# Patient Record
Sex: Female | Born: 1955 | Race: White | Hispanic: No | Smoking: Never smoker
Health system: Southern US, Community
[De-identification: ages and names within clinical notes are randomized; demographics above are authoritative.]

## PROBLEM LIST (undated history)

## (undated) DIAGNOSIS — K219 Gastro-esophageal reflux disease without esophagitis: Secondary | ICD-10-CM

## (undated) DIAGNOSIS — M961 Postlaminectomy syndrome, not elsewhere classified: Secondary | ICD-10-CM

## (undated) DIAGNOSIS — E119 Type 2 diabetes mellitus without complications: Secondary | ICD-10-CM

## (undated) DIAGNOSIS — E039 Hypothyroidism, unspecified: Secondary | ICD-10-CM

## (undated) DIAGNOSIS — G4733 Obstructive sleep apnea (adult) (pediatric): Secondary | ICD-10-CM

## (undated) DIAGNOSIS — L409 Psoriasis, unspecified: Secondary | ICD-10-CM

## (undated) DIAGNOSIS — R600 Localized edema: Secondary | ICD-10-CM

## (undated) DIAGNOSIS — N3281 Overactive bladder: Secondary | ICD-10-CM

## (undated) DIAGNOSIS — F32A Depression, unspecified: Secondary | ICD-10-CM

## (undated) DIAGNOSIS — I10 Essential (primary) hypertension: Secondary | ICD-10-CM

## (undated) DIAGNOSIS — Z974 Presence of external hearing-aid: Secondary | ICD-10-CM

## (undated) DIAGNOSIS — G4730 Sleep apnea, unspecified: Secondary | ICD-10-CM

## (undated) DIAGNOSIS — N393 Stress incontinence (female) (male): Secondary | ICD-10-CM

## (undated) DIAGNOSIS — L4050 Arthropathic psoriasis, unspecified: Secondary | ICD-10-CM

## (undated) DIAGNOSIS — M4800 Spinal stenosis, site unspecified: Secondary | ICD-10-CM

## (undated) DIAGNOSIS — E7800 Pure hypercholesterolemia, unspecified: Secondary | ICD-10-CM

## (undated) DIAGNOSIS — M109 Gout, unspecified: Secondary | ICD-10-CM

## (undated) DIAGNOSIS — Z9989 Dependence on other enabling machines and devices: Secondary | ICD-10-CM

## (undated) DIAGNOSIS — I519 Heart disease, unspecified: Secondary | ICD-10-CM

## (undated) DIAGNOSIS — E785 Hyperlipidemia, unspecified: Secondary | ICD-10-CM

## (undated) HISTORY — PX: CHOLECYSTECTOMY: SHX55

## (undated) HISTORY — DX: Spinal stenosis, site unspecified: M48.00

## (undated) HISTORY — PX: TONSILLECTOMY: SUR1361

## (undated) HISTORY — DX: Depression, unspecified: F32.A

## (undated) HISTORY — DX: Essential (primary) hypertension: I10

## (undated) HISTORY — PX: OTHER SURGICAL HISTORY: SHX169

## (undated) HISTORY — DX: Psoriasis, unspecified: L40.9

## (undated) HISTORY — DX: Hyperlipidemia, unspecified: E78.5

## (undated) HISTORY — PX: BREAST EXCISIONAL BIOPSY: SUR124

## (undated) HISTORY — DX: Hypothyroidism, unspecified: E03.9

## (undated) HISTORY — PX: ABDOMINAL HYSTERECTOMY: SHX81

---
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
## 1999-04-08 HISTORY — PX: LAPAROSCOPIC CHOLECYSTECTOMY: SUR755

## 1999-04-08 HISTORY — PX: BREAST EXCISIONAL BIOPSY: SUR124

## 2000-04-07 HISTORY — PX: BREAST EXCISIONAL BIOPSY: SUR124

## 2001-04-07 HISTORY — PX: TOTAL ABDOMINAL HYSTERECTOMY W/ BILATERAL SALPINGOOPHORECTOMY: SHX83

## 2013-04-07 HISTORY — PX: ROTATOR CUFF REPAIR: SHX139

## 2014-05-15 ENCOUNTER — Other Ambulatory Visit (HOSPITAL_COMMUNITY): Payer: Self-pay | Admitting: Internal Medicine

## 2014-05-15 DIAGNOSIS — M545 Low back pain, unspecified: Secondary | ICD-10-CM

## 2014-05-23 ENCOUNTER — Ambulatory Visit (HOSPITAL_COMMUNITY): Payer: No Typology Code available for payment source

## 2014-10-06 HISTORY — PX: LUMBAR FUSION: SHX111

## 2014-10-10 ENCOUNTER — Other Ambulatory Visit: Payer: Self-pay | Admitting: Internal Medicine

## 2014-10-10 DIAGNOSIS — Z1231 Encounter for screening mammogram for malignant neoplasm of breast: Secondary | ICD-10-CM

## 2014-10-13 ENCOUNTER — Ambulatory Visit
Admission: RE | Admit: 2014-10-13 | Discharge: 2014-10-13 | Disposition: A | Discharge status: Home / Self Care | Payer: 59 | Source: Ambulatory Visit | Attending: Internal Medicine | Admitting: Internal Medicine

## 2014-10-13 DIAGNOSIS — Z1231 Encounter for screening mammogram for malignant neoplasm of breast: Secondary | ICD-10-CM | POA: Insufficient documentation

## 2015-04-08 DIAGNOSIS — I5032 Chronic diastolic (congestive) heart failure: Secondary | ICD-10-CM

## 2015-04-08 HISTORY — DX: Chronic diastolic (congestive) heart failure: I50.32

## 2015-09-06 HISTORY — PX: ANTERIOR CERVICAL DECOMP/DISCECTOMY FUSION: SHX1161

## 2015-09-28 ENCOUNTER — Other Ambulatory Visit: Payer: Self-pay | Admitting: Internal Medicine

## 2015-09-28 DIAGNOSIS — Z1231 Encounter for screening mammogram for malignant neoplasm of breast: Secondary | ICD-10-CM

## 2015-10-16 ENCOUNTER — Ambulatory Visit: Payer: No Typology Code available for payment source

## 2015-10-24 DIAGNOSIS — M4800 Spinal stenosis, site unspecified: Secondary | ICD-10-CM | POA: Insufficient documentation

## 2015-10-24 DIAGNOSIS — M4802 Spinal stenosis, cervical region: Secondary | ICD-10-CM

## 2015-10-24 DIAGNOSIS — L409 Psoriasis, unspecified: Secondary | ICD-10-CM | POA: Insufficient documentation

## 2015-10-24 DIAGNOSIS — I10 Essential (primary) hypertension: Secondary | ICD-10-CM | POA: Insufficient documentation

## 2015-10-24 DIAGNOSIS — E785 Hyperlipidemia, unspecified: Secondary | ICD-10-CM

## 2015-10-25 ENCOUNTER — Telehealth: Payer: Self-pay | Admitting: Cardiology

## 2015-10-25 ENCOUNTER — Ambulatory Visit (INDEPENDENT_AMBULATORY_CARE_PROVIDER_SITE_OTHER): Payer: 59 | Admitting: Cardiology

## 2015-10-25 VITALS — BP 132/86 | HR 78 | Wt 222.0 lb

## 2015-10-25 DIAGNOSIS — R9431 Abnormal electrocardiogram [ECG] [EKG]: Secondary | ICD-10-CM

## 2015-10-25 DIAGNOSIS — R0602 Shortness of breath: Secondary | ICD-10-CM | POA: Diagnosis not present

## 2015-10-25 DIAGNOSIS — R002 Palpitations: Secondary | ICD-10-CM | POA: Diagnosis not present

## 2015-10-25 NOTE — Progress Notes (Signed)
Clinical Summary Virginia Marks is a 60 y.o.female seen as a new patient today, she is referred by Dr Margo AyeHall  1. Abnormal EKG - EKG at pcp recently showed NSR, LAFB - patient brings in old EKG from 05/2012 which shows LAFB at that time as well - no history of structural heart disease, negative stress echo in 2014  2. Palpitations - feeling of heart beating/fluttering. Occuring daily, no other associated symptoms. Lasts for several hours. Often comes on after activity - no coffee, drinks tea 3 glasses, no sodas, no EtoH - started approximately 1 year.   3. Obesity - 50 lbs weight gain over the last 2 years, she attributes to inactivity.  Past Medical History  Diagnosis Date  . Hypertension   . Hyperlipidemia   . Spinal stenosis   . Psoriasis     SCALP     Allergies not on file   Current Outpatient Prescriptions  Medication Sig Dispense Refill  . amphetamine-dextroamphetamine (ADDERALL XR) 20 MG 24 hr capsule Take 20 mg by mouth 2 (two) times daily.    Marland Kitchen. aspirin 81 MG tablet Take 81 mg by mouth daily.    . clobetasol (TEMOVATE) 0.05 % external solution Apply 1 application topically 2 (two) times daily.    . furosemide (LASIX) 20 MG tablet Take 20 mg by mouth daily.    Marland Kitchen. gabapentin (NEURONTIN) 300 MG capsule Take 300 mg by mouth 3 (three) times daily.    . hydrOXYzine (ATARAX/VISTARIL) 25 MG tablet Take 25 mg by mouth 2 (two) times daily.    Marland Kitchen. ibuprofen (ADVIL,MOTRIN) 800 MG tablet Take 800 mg by mouth every 8 (eight) hours as needed.    . potassium chloride (KLOR-CON) 8 MEQ tablet Take 8 mEq by mouth daily.    . sertraline (ZOLOFT) 100 MG tablet Take 100 mg by mouth daily.    . traMADol (ULTRAM) 50 MG tablet Take 50 mg by mouth every 12 (twelve) hours as needed.     No current facility-administered medications for this visit.     Past Surgical History  Procedure Laterality Date  . Breast excisional biopsy Right 2001    negative  . Breast excisional biopsy Right 2002   negative     Allergies not on file    Family History  Problem Relation Age of Onset  . Breast cancer Sister 8560  . Breast cancer Maternal Aunt 70  . Breast cancer Maternal Aunt 70  . Breast cancer Maternal Aunt 80  . Breast cancer Maternal Aunt 70  . Hypertension Mother      Social History Virginia Marks reports that she has never smoked. She does not have any smokeless tobacco history on file. Virginia Marks reports that she does not drink alcohol.   Review of Systems CONSTITUTIONAL: No weight loss, fever, chills, weakness or fatigue.  HEENT: Eyes: No visual loss, blurred vision, double vision or yellow sclerae.No hearing loss, sneezing, congestion, runny nose or sore throat.  SKIN: No rash or itching.  CARDIOVASCULAR: per HPI RESPIRATORY: No shortness of breath, cough or sputum.  GASTROINTESTINAL: No anorexia, nausea, vomiting or diarrhea. No abdominal pain or blood.  GENITOURINARY: No burning on urination, no polyuria NEUROLOGICAL: No headache, dizziness, syncope, paralysis, ataxia, numbness or tingling in the extremities. No change in bowel or bladder control.  MUSCULOSKELETAL: No muscle, back pain, joint pain or stiffness.  LYMPHATICS: No enlarged nodes. No history of splenectomy.  PSYCHIATRIC: No history of depression or anxiety.  ENDOCRINOLOGIC: No reports of  sweating, cold or heat intolerance. No polyuria or polydipsia.  Marland Kitchen   Physical Examination Filed Vitals:   10/25/15 0919  BP: 132/86  Pulse: 78   Filed Vitals:   10/25/15 0919  Weight: 222 lb (100.699 kg)    Gen: resting comfortably, no acute distress HEENT: no scleral icterus, pupils equal round and reactive, no palptable cervical adenopathy,  CV: RRR, no m/r/g, no jvd Resp: Clear to auscultation bilaterally GI: abdomen is soft, non-tender, non-distended, normal bowel sounds, no hepatosplenomegaly MSK: extremities are warm, no edema.  Skin: warm, no rash Neuro:  no focal deficits Psych: appropriate  affect    Assessment and Plan   1. 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 Palpitations - we will obtian 7 day event monitor  3. SOB - unclear etiology. Recent severe weight gain may be playing some role. With her other cardiac symptoms we will obtain echo. Stress echo 2014 negative for ishcemia at outside hospital  F/u pending testing results  Antoine Poche, M.D.

## 2015-10-25 NOTE — Telephone Encounter (Signed)
They([facility])  needed to verify pt's address

## 2015-10-25 NOTE — Patient Instructions (Signed)
Medication Instructions:  Your physician recommends that you continue on your current medications as directed. Please refer to the Current Medication list given to you today.   Labwork: NONE  Testing/Procedures: Your physician has requested that you have an echocardiogram. Echocardiography is a painless test that uses sound waves to create images of your heart. It provides your doctor with information about the size and shape of your heart and how well your heart's chambers and valves are working. This procedure takes approximately one hour. There are no restrictions for this procedure.  Your physician has recommended that you wear an event monitor. Event monitors are medical devices that record the heart's electrical activity. Doctors most often us these monitors to diagnose arrhythmias. Arrhythmias are problems with the speed or rhythm of the heartbeat. The monitor is a small, portable device. You can wear one while you do your normal daily activities. This is usually used to diagnose what is causing palpitations/syncope (passing out).* 7 DAY*     Follow-Up: Your physician recommends that you schedule a follow-up appointment in: TO BE DETERMINED BASED ON TEST RESULTS   Any Other Special Instructions Will Be Listed Below (If Applicable).     If you need a refill on your cardiac medications before your next appointment, please call your pharmacy. Thanks for choosing [facility]!!!

## 2015-10-25 NOTE — Telephone Encounter (Signed)
[facility] called and spoke w/ Judeth CornfieldStephanie in [place] and they're needing to speak with a nurse concerning this pt's monitor [phone] select 1 then 3  for Virginia Marks

## 2015-10-26 ENCOUNTER — Other Ambulatory Visit: Payer: Self-pay | Admitting: Internal Medicine

## 2015-10-26 ENCOUNTER — Ambulatory Visit
Admission: RE | Admit: 2015-10-26 | Discharge: 2015-10-26 | Disposition: A | Discharge status: Home / Self Care | Payer: 59 | Source: Ambulatory Visit | Attending: Internal Medicine | Admitting: Internal Medicine

## 2015-10-26 DIAGNOSIS — Z1231 Encounter for screening mammogram for malignant neoplasm of breast: Secondary | ICD-10-CM

## 2015-10-27 ENCOUNTER — Encounter: Payer: Self-pay | Admitting: Cardiology

## 2015-10-29 ENCOUNTER — Ambulatory Visit (HOSPITAL_COMMUNITY)
Admission: RE | Admit: 2015-10-29 | Discharge: 2015-10-29 | Disposition: A | Discharge status: Home / Self Care | Payer: 59 | Source: Ambulatory Visit | Attending: Cardiology | Admitting: Cardiology

## 2015-10-29 DIAGNOSIS — R0602 Shortness of breath: Secondary | ICD-10-CM | POA: Diagnosis not present

## 2015-10-29 DIAGNOSIS — I119 Hypertensive heart disease without heart failure: Secondary | ICD-10-CM | POA: Diagnosis not present

## 2015-10-29 DIAGNOSIS — E785 Hyperlipidemia, unspecified: Secondary | ICD-10-CM | POA: Diagnosis not present

## 2015-10-29 DIAGNOSIS — I350 Nonrheumatic aortic (valve) stenosis: Secondary | ICD-10-CM | POA: Insufficient documentation

## 2015-10-29 DIAGNOSIS — I38 Endocarditis, valve unspecified: Secondary | ICD-10-CM | POA: Diagnosis not present

## 2015-10-29 LAB — ECHOCARDIOGRAM COMPLETE
AO mean calculated velocity dopler: 122 cm/s
AOPV: 0.61 m/s
AOVTI: 41.6 cm
AV Area VTI index: 0.77 cm2/m2
AV Area VTI: 1.72 cm2
AV Peak grad: 16 mmHg
AV VEL mean LVOT/AV: 0.63
AV peak Index: 0.79
AV vel: 1.69
AVAREAMEANV: 1.79 cm2
AVAREAMEANVIN: 0.82 cm2/m2
AVG: 8 mmHg
AVPKVEL: 200 cm/s
CHL CUP AV VALUE AREA INDEX: 0.77
CHL CUP DOP CALC LVOT VTI: 24.8 cm
E decel time: 208 msec
E/e' ratio: 11.6
FS: 42 % (ref 28–44)
IV/PV OW: 1.07
LA ID, A-P, ES: 42 mm
LA diam end sys: 42 mm
LA vol: 44.2 mL
LADIAMINDEX: 1.93 cm/m2
LAVOLA4C: 46.6 mL
LAVOLIN: 20.3 mL/m2
LV E/e' medial: 11.6
LV PW d: 10.7 mm — AB (ref 0.6–1.1)
LV TDI E'LATERAL: 8.05
LV dias vol index: 27 mL/m2
LV e' LATERAL: 8.05 cm/s
LVDIAVOL: 59 mL (ref 46–106)
LVEEAVG: 11.6
LVOT area: 2.84 cm2
LVOT diameter: 19 mm
LVOT peak grad rest: 6 mmHg
LVOTPV: 121 cm/s
LVOTSV: 70 mL
LVOTVTI: 0.6 cm
MV Dec: 208
MV Peak grad: 3 mmHg
MVPKAVEL: 83.1 m/s
MVPKEVEL: 93.4 m/s
RV LATERAL S' VELOCITY: 19.3 cm/s
RV TAPSE: 24.4 mm
TDI e' medial: 7.18
Valve area: 1.69 cm2

## 2015-10-29 NOTE — Progress Notes (Signed)
*  PRELIMINARY RESULTS* Echocardiogram 2D Echocardiogram has been performed.  Stacey Drain 10/29/2015, 2:09 PM

## 2015-10-31 ENCOUNTER — Ambulatory Visit (INDEPENDENT_AMBULATORY_CARE_PROVIDER_SITE_OTHER): Payer: No Typology Code available for payment source

## 2015-10-31 DIAGNOSIS — R002 Palpitations: Secondary | ICD-10-CM

## 2015-11-06 ENCOUNTER — Other Ambulatory Visit: Payer: Self-pay | Admitting: Internal Medicine

## 2015-11-06 DIAGNOSIS — Z1231 Encounter for screening mammogram for malignant neoplasm of breast: Secondary | ICD-10-CM

## 2015-11-06 DIAGNOSIS — N6310 Unspecified lump in the right breast, unspecified quadrant: Secondary | ICD-10-CM

## 2015-11-09 ENCOUNTER — Telehealth: Payer: Self-pay

## 2015-11-09 NOTE — Telephone Encounter (Signed)
-----   Message from Albertine Patricia, CMA sent at 11/08/2015  4:29 PM EDT -----   ----- Message ----- From: Antoine Poche, MD Sent: 11/08/2015   4:05 PM To: Staci T Ashworth, CMA  Heart monitor shows just some occasional extra heart beats as the cause of her symptoms, no significant abnormal rhythms. If symptoms are bothersome enough we can try starting lopressor 12.5mg  bid. She can f/u with [place] in 6 weeks.  Dominga Ferry MD

## 2015-11-09 NOTE — Telephone Encounter (Signed)
Called pt, left vm for pt to return the call.

## 2015-11-22 ENCOUNTER — Ambulatory Visit
Admission: RE | Admit: 2015-11-22 | Discharge: 2015-11-22 | Disposition: A | Discharge status: Home / Self Care | Payer: 59 | Source: Ambulatory Visit | Attending: Internal Medicine | Admitting: Internal Medicine

## 2015-11-22 DIAGNOSIS — N63 Unspecified lump in breast: Secondary | ICD-10-CM | POA: Diagnosis not present

## 2015-11-22 DIAGNOSIS — N6310 Unspecified lump in the right breast, unspecified quadrant: Secondary | ICD-10-CM

## 2015-11-22 DIAGNOSIS — Z1231 Encounter for screening mammogram for malignant neoplasm of breast: Secondary | ICD-10-CM

## 2015-11-23 ENCOUNTER — Other Ambulatory Visit: Payer: Self-pay | Admitting: Internal Medicine

## 2015-11-23 DIAGNOSIS — R928 Other abnormal and inconclusive findings on diagnostic imaging of breast: Secondary | ICD-10-CM

## 2015-11-27 ENCOUNTER — Ambulatory Visit (INDEPENDENT_AMBULATORY_CARE_PROVIDER_SITE_OTHER): Payer: 59 | Admitting: Cardiology

## 2015-11-27 ENCOUNTER — Encounter: Payer: Self-pay | Admitting: Cardiology

## 2015-11-27 VITALS — BP 142/88 | HR 91 | Ht 64.0 in | Wt 230.0 lb

## 2015-11-27 DIAGNOSIS — R9431 Abnormal electrocardiogram [ECG] [EKG]: Secondary | ICD-10-CM | POA: Diagnosis not present

## 2015-11-27 DIAGNOSIS — R002 Palpitations: Secondary | ICD-10-CM

## 2015-11-27 DIAGNOSIS — I10 Essential (primary) hypertension: Secondary | ICD-10-CM | POA: Diagnosis not present

## 2015-11-27 DIAGNOSIS — R600 Localized edema: Secondary | ICD-10-CM | POA: Diagnosis not present

## 2015-11-27 MED ORDER — METOPROLOL TARTRATE 25 MG PO TABS: 12.5000 mg | ORAL_TABLET | Freq: Two times a day (BID) | ORAL | 3 refills | Status: DC

## 2015-11-27 MED ORDER — FUROSEMIDE 20 MG PO TABS: 20.0000 mg | ORAL_TABLET | Freq: Every day | ORAL | 3 refills | Status: DC | PRN

## 2015-11-27 MED ORDER — POTASSIUM CHLORIDE CRYS ER 20 MEQ PO TBCR: EXTENDED_RELEASE_TABLET | ORAL | 3 refills | Status: DC

## 2015-11-27 NOTE — Progress Notes (Signed)
Clinical Summary [name] is a 60 y.o.female seen today for follow up of the following medical problems.   1. Abnormal EKG - EKG at pcp recently showed NSR, LAFB - patient brings in old EKG from 05/2012 which shows LAFB at that time as well - no history of structural heart disease, negative stress echo in 2014 - since last visit complete echo that showed normal LVEF, grade II diastolic dysfunction.   2. Palpitations - feeling of heart beating/fluttering. Occuring daily, no other associated symptoms. Lasts for several hours. Often comes on after activity - no coffee, drinks tea 3 glasses, no sodas, no EtoH - started approximately 1 year.   - heart monitor 10/2015 with SR, sinus tach and occasional PVCs - given Rx for lopressor 12.5mg  bid but has not started yest.    3. 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  Past Medical History:  Diagnosis Date  . Hyperlipidemia   . Hypertension   . Psoriasis    SCALP  . Spinal stenosis      Allergies  Allergen Reactions  . Sulfa Antibiotics Rash     Current Outpatient Prescriptions  Medication Sig Dispense Refill  . aspirin 325 MG tablet Take 325 mg by mouth daily.    . clobetasol (TEMOVATE) 0.05 % external solution Apply 1 application topically 2 (two) times daily.    Marland Kitchen. gabapentin (NEURONTIN) 300 MG capsule Take 300 mg by mouth 2 (two) times daily.     . hydrOXYzine (ATARAX/VISTARIL) 25 MG tablet Take 25 mg by mouth 2 (two) times daily. 1-2 tABLETS @BEDTIME     . ibuprofen (ADVIL,MOTRIN) 800 MG tablet Take 800 mg by mouth every 8 (eight) hours as needed.    . sertraline (ZOLOFT) 100 MG tablet Take 100 mg by mouth daily.    . traMADol (ULTRAM) 50 MG tablet Take 50 mg by mouth every 6 (six) hours as needed.      No current facility-administered medications for this visit.      Past Surgical History:  Procedure Laterality Date  .  BREAST EXCISIONAL BIOPSY Right 2001   negative  . BREAST EXCISIONAL BIOPSY Right 2002   negative     Allergies  Allergen Reactions  . Sulfa Antibiotics Rash      Family History  Problem Relation Age of Onset  . Breast cancer Sister 960  . Breast cancer Maternal Aunt 70  . Breast cancer Maternal Aunt 70  . Breast cancer Maternal Aunt 80  . Breast cancer Maternal Aunt 70  . Hypertension Mother      Social History Virginia Marks reports that she has never smoked. She does not have any smokeless tobacco history on file. [name] reports that she does not drink alcohol.   Review of Systems CONSTITUTIONAL: No weight loss, fever, chills, weakness or fatigue.  HEENT: Eyes: No visual loss, blurred vision, double vision or yellow sclerae.No hearing loss, sneezing, congestion, runny nose or sore throat.  SKIN: No rash or itching.  CARDIOVASCULAR: per HPI RESPIRATORY: No shortness of breath, cough or sputum.  GASTROINTESTINAL: No anorexia, nausea, vomiting or diarrhea. No abdominal pain or blood.  GENITOURINARY: No burning on urination, no polyuria NEUROLOGICAL: No headache, dizziness, syncope, paralysis, ataxia, numbness or tingling in the extremities. No change in bowel or bladder control.  MUSCULOSKELETAL: No muscle, back pain, joint pain or stiffness.  LYMPHATICS: No enlarged nodes. No history  of splenectomy.  PSYCHIATRIC: No history of depression or anxiety.  ENDOCRINOLOGIC: No reports of sweating, cold or heat intolerance. No polyuria or polydipsia.  Marland Kitchen.   Physical Examination Vitals:   11/27/15 1602  BP: (!) 142/88  Pulse: 91   Vitals:   11/27/15 1602  Weight: 230 lb (104.3 kg)  Height: 5\' 4"  (1.626 m)    Gen: resting comfortably, no acute distress HEENT: no scleral icterus, pupils equal round and reactive, no palptable cervical adenopathy,  CV: RRR, no m/r/g, no jvd Resp: Clear to auscultation bilaterally GI: abdomen is soft, non-tender, non-distended, normal bowel  sounds, no hepatosplenomegaly MSK: extremities are warm, no edema.  Skin: warm, no rash Neuro:  no focal deficits Psych: appropriate affect   Diagnostic Studies 10/2015 echo Study Conclusions  - Left ventricle: The cavity size was normal. Wall thickness was   increased in a pattern of mild LVH. Systolic function was normal.   The estimated ejection fraction was in the range of 60% to 65%.   Wall motion was normal; there were no regional wall motion   abnormalities. Features are consistent with a pseudonormal left   ventricular filling pattern, with concomitant abnormal relaxation   and increased filling pressure (grade 2 diastolic dysfunction). - Aortic valve: Mildly calcified annulus. Trileaflet. There was   very mild stenosis. Peak velocity (S): 200 cm/s. Mean gradient   (S): 8 mm Hg. Valve area (VTI): 1.69 cm^2. Valve area (Vmax):   1.72 cm^2. Valve area (Vmean): 1.79 cm^2. - Mitral valve: Calcified annulus.    10/2015 Heart monitor  Telemetry tracings show sinus rhythm  Reported symptoms correlate with sinus rhythm and sinus tachycardia with occasional PVCs  No significant arrhythmias   Assessment and Plan   1. 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   2. Palpitations - monitor with just occasonal PVCs. We will start lopressor 12.5mg  bid.   3. LE edema - may be related to diastolic dysfunction - she will start lasix 20mg  prn, on days she takes lasix will also take KCl 20mEq.  - check BMET/Mg in 2 weeks    F/u 6 months      Antoine PocheJonathan F. Sherod Cisse, M.D.

## 2015-11-27 NOTE — Patient Instructions (Signed)
Your physician wants you to follow-up in: 6 MONTHS WITH DR. BRANCH You will receive a reminder letter in the mail two months in advance. If you don't receive a letter, please call our office to schedule the follow-up appointment.  Your physician has recommended you make the following change in your medication:   START METOPROLOL 12.5 MG TWICE DAILY  START LASIX 20 MG DAILY AS NEEDED FOR SWELLING  START POTASSIUM 20 MEQ TAKE ON DAYS WHEN TAKING LASIX  Your physician recommends that you return for lab work in: 2 WEEKS BMP/MG  Thank you for choosing [facility]!!

## 2015-11-28 ENCOUNTER — Encounter: Payer: Self-pay | Admitting: *Deleted

## 2015-11-29 ENCOUNTER — Ambulatory Visit: Payer: No Typology Code available for payment source | Admitting: Cardiology

## 2015-11-30 ENCOUNTER — Ambulatory Visit
Admission: RE | Admit: 2015-11-30 | Discharge: 2015-11-30 | Disposition: A | Discharge status: Home / Self Care | Payer: 59 | Source: Ambulatory Visit | Attending: Internal Medicine | Admitting: Internal Medicine

## 2015-11-30 ENCOUNTER — Ambulatory Visit: Payer: No Typology Code available for payment source | Admitting: Adult Health

## 2015-11-30 DIAGNOSIS — R928 Other abnormal and inconclusive findings on diagnostic imaging of breast: Secondary | ICD-10-CM | POA: Diagnosis present

## 2015-11-30 DIAGNOSIS — N6001 Solitary cyst of right breast: Secondary | ICD-10-CM | POA: Diagnosis not present

## 2016-01-04 ENCOUNTER — Other Ambulatory Visit (HOSPITAL_COMMUNITY)
Admission: RE | Admit: 2016-01-04 | Discharge: 2016-01-04 | Disposition: A | Discharge status: Home / Self Care | Payer: BC Managed Care – PPO | Source: Ambulatory Visit | Attending: Cardiology | Admitting: Cardiology

## 2016-01-04 DIAGNOSIS — I10 Essential (primary) hypertension: Secondary | ICD-10-CM | POA: Diagnosis present

## 2016-01-04 LAB — BASIC METABOLIC PANEL
ANION GAP: 5 (ref 5–15)
BUN: 21 mg/dL — ABNORMAL HIGH (ref 6–20)
CALCIUM: 9.3 mg/dL (ref 8.9–10.3)
CO2: 28 mmol/L (ref 22–32)
CREATININE: 0.69 mg/dL (ref 0.44–1.00)
Chloride: 106 mmol/L (ref 101–111)
GLUCOSE: 94 mg/dL (ref 65–99)
Potassium: 4.6 mmol/L (ref 3.5–5.1)
Sodium: 139 mmol/L (ref 135–145)

## 2016-01-04 LAB — MAGNESIUM: Magnesium: 2.2 mg/dL (ref 1.7–2.4)

## 2016-01-28 ENCOUNTER — Ambulatory Visit (INDEPENDENT_AMBULATORY_CARE_PROVIDER_SITE_OTHER): Payer: Worker's Compensation

## 2016-01-28 ENCOUNTER — Ambulatory Visit (INDEPENDENT_AMBULATORY_CARE_PROVIDER_SITE_OTHER): Payer: Worker's Compensation | Admitting: Family Medicine

## 2016-01-28 ENCOUNTER — Encounter: Payer: Self-pay | Admitting: Family Medicine

## 2016-01-28 VITALS — BP 107/60 | HR 61 | Temp 98.0°F | Ht 64.0 in | Wt 230.4 lb

## 2016-01-28 DIAGNOSIS — S300XXA Contusion of lower back and pelvis, initial encounter: Secondary | ICD-10-CM | POA: Diagnosis not present

## 2016-01-28 DIAGNOSIS — S8012XA Contusion of left lower leg, initial encounter: Secondary | ICD-10-CM

## 2016-01-28 DIAGNOSIS — S8002XA Contusion of left knee, initial encounter: Secondary | ICD-10-CM

## 2016-01-28 DIAGNOSIS — S60221A Contusion of right hand, initial encounter: Secondary | ICD-10-CM

## 2016-01-28 DIAGNOSIS — S5011XA Contusion of right forearm, initial encounter: Secondary | ICD-10-CM

## 2016-01-28 DIAGNOSIS — S20229A Contusion of unspecified back wall of thorax, initial encounter: Secondary | ICD-10-CM

## 2016-01-28 DIAGNOSIS — W01198A Fall on same level from slipping, tripping and stumbling with subsequent striking against other object, initial encounter: Secondary | ICD-10-CM | POA: Diagnosis not present

## 2016-01-28 DIAGNOSIS — T07XXXA Unspecified multiple injuries, initial encounter: Secondary | ICD-10-CM

## 2016-01-28 DIAGNOSIS — S76211A Strain of adductor muscle, fascia and tendon of right thigh, initial encounter: Secondary | ICD-10-CM

## 2016-01-28 DIAGNOSIS — S29012A Strain of muscle and tendon of back wall of thorax, initial encounter: Secondary | ICD-10-CM

## 2016-01-28 MED ORDER — HYDROCODONE-ACETAMINOPHEN 5-325 MG PO TABS: 1.0000 | ORAL_TABLET | Freq: Four times a day (QID) | ORAL | 0 refills | Status: DC | PRN

## 2016-01-28 MED ORDER — DICLOFENAC SODIUM 75 MG PO TBEC: 75.0000 mg | DELAYED_RELEASE_TABLET | Freq: Two times a day (BID) | ORAL | 2 refills | Status: DC

## 2016-01-28 NOTE — Progress Notes (Addendum)
Subjective:  Patient ID: Virginia Marks, female    DOB: [birth year]  Age: 60 y.o. MRN: [id number]  CC: Workers Comp (pt here today after tripping over her rolling bag in the school parking lot. She is having right sided groin pain, left knee pain, upper back pain.)   HPI [name] presents for Patient fell over her rolling bag and tripped in the parking lot at the local high school this morning. She fell on her left knee and right forearm and wrenched her back. She has a history of spinal fusion L3-S1 done in the recent past. Earlier this year she had a cervical fusion. Her pain is just right of the spine. If she points to approximately T9 and T10 region. However, her worst pain is at the right groin. She says she never felt a pain like she felt in the groin when she fell. It is still quite painful and red. Additionally there is pain at the right elbow wrist and forearm. And she has multiple scrapes on the right forearm. Additionally there are multiple scrapes at the left knee. She is able to walk. Mild to moderate pain at the knee.  History Erick has a past medical history of Hyperlipidemia; Hypertension; Psoriasis; and Spinal stenosis.   She has a past surgical history that includes Breast excisional biopsy (Right, 2001) and Breast excisional biopsy (Right, 2002).   Her family history includes Breast cancer (age of onset: 74) in her sister; Breast cancer (age of onset: 21) in her maternal aunt, maternal aunt, and maternal aunt; Breast cancer (age of onset: 24) in her maternal aunt; Hypertension in her mother.She reports that she has never smoked. She has never used smokeless tobacco. She reports that she does not drink alcohol. Her drug history is not on file.  Current Outpatient Prescriptions on File Prior to Visit  Medication Sig Dispense Refill  . clobetasol (TEMOVATE) 0.05 % external solution Apply 1 application topically 2 (two) times daily.    . furosemide (LASIX) 20 MG tablet  Take 1 tablet (20 mg total) by mouth daily as needed. 90 tablet 3  . gabapentin (NEURONTIN) 300 MG capsule Take 300 mg by mouth 2 (two) times daily.     . hydrOXYzine (ATARAX/VISTARIL) 25 MG tablet Take 25 mg by mouth daily. 1-2 tABLETS @BEDTIME     . ibuprofen (ADVIL,MOTRIN) 800 MG tablet Take 800 mg by mouth every 8 (eight) hours as needed.    . metoprolol tartrate (LOPRESSOR) 25 MG tablet Take 0.5 tablets (12.5 mg total) by mouth 2 (two) times daily. 45 tablet 3  . potassium chloride SA (K-DUR,KLOR-CON) 20 MEQ tablet TAKE 1 TAB DAILY ON DAYS TAKING LASIX 90 tablet 3  . sertraline (ZOLOFT) 100 MG tablet Take 100 mg by mouth daily.     No current facility-administered medications on file prior to visit.     ROS Review of Systems  Constitutional: Negative for activity change, appetite change and fever.  HENT: Negative for congestion, rhinorrhea and sore throat.   Eyes: Negative for visual disturbance.  Respiratory: Negative for cough and shortness of breath.   Cardiovascular: Negative for chest pain and palpitations.  Gastrointestinal: Negative for abdominal pain, diarrhea and nausea.  Genitourinary: Negative for dysuria.  Musculoskeletal: Positive for arthralgias, joint swelling and myalgias.    Objective:  BP 107/60   Pulse 61   Temp 98 F (36.7 C) (Oral)   Ht 5\' 4"  (1.626 m)   Wt 230 lb 6 oz (104.5 kg)   BMI  39.54 kg/m   Physical Exam  Constitutional: She is oriented to person, place, and time. She appears well-developed and well-nourished. She appears distressed.  HENT:  Head: Normocephalic and atraumatic.  Cardiovascular: Normal rate and regular rhythm.   No murmur heard. Pulmonary/Chest: Effort normal and breath sounds normal.  Abdominal: Soft. There is no tenderness.  Musculoskeletal: She exhibits tenderness (left knee, right forearm, right inner upper thigh).  Tender at right thigh adductor origin at pubis   Neurological: She is alert and oriented to person, place,  and time.    Assessment & Plan:   [name] was seen today for workers comp.  Diagnoses and all orders for this visit:  Multiple contusions -     DG Thoracic Spine 2 View -     DG Lumbar Spine 2-3 Views; Future -     DG Forearm Right; Future -     DG Knee 1-2 Views Left; Future -     DG Pelvis 1-2 Views; Future -     DG Hand Complete Right; Future  Contusion, knee and lower leg, left, initial encounter  Contusion, forearm and elbow, right, initial encounter  Groin strain, right, initial encounter  Upper back strain, initial encounter  Other orders -     diclofenac (VOLTAREN) 75 MG EC tablet; Take 1 tablet (75 mg total) by mouth 2 (two) times daily. For muscle and  Joint pain -     HYDROcodone-acetaminophen (NORCO) 5-325 MG tablet; Take 1 tablet by mouth every 6 (six) hours as needed for moderate pain.   I am having Ms. Mucha start on diclofenac and HYDROcodone-acetaminophen. I am also having her maintain her clobetasol, hydrOXYzine, gabapentin, ibuprofen, sertraline, furosemide, potassium chloride SA, metoprolol tartrate, and aspirin.  Meds ordered this encounter  Medications  . aspirin 325 MG tablet    Sig: Take 325 mg by mouth daily.  . diclofenac (VOLTAREN) 75 MG EC tablet    Sig: Take 1 tablet (75 mg total) by mouth 2 (two) times daily. For muscle and  Joint pain    Dispense:  60 tablet    Refill:  2  . HYDROcodone-acetaminophen (NORCO) 5-325 MG tablet    Sig: Take 1 tablet by mouth every 6 (six) hours as needed for moderate pain.    Dispense:  12 tablet    Refill:  0   Off work the rest of today. Light duty for the rest of the week. Follow up 4 days.  Follow-up: Return in about 4 days (around 02/01/2016).  Mechele ClaudeWarren Shellee Streng, M.D.

## 2016-02-01 ENCOUNTER — Encounter (INDEPENDENT_AMBULATORY_CARE_PROVIDER_SITE_OTHER): Payer: Self-pay

## 2016-02-01 ENCOUNTER — Ambulatory Visit (INDEPENDENT_AMBULATORY_CARE_PROVIDER_SITE_OTHER): Payer: Worker's Compensation | Admitting: Family Medicine

## 2016-02-01 VITALS — BP 130/66 | HR 65 | Temp 98.1°F | Ht 64.0 in | Wt 232.1 lb

## 2016-02-01 DIAGNOSIS — S8002XD Contusion of left knee, subsequent encounter: Secondary | ICD-10-CM | POA: Diagnosis not present

## 2016-02-01 DIAGNOSIS — S8012XD Contusion of left lower leg, subsequent encounter: Secondary | ICD-10-CM

## 2016-02-01 DIAGNOSIS — T07XXXA Unspecified multiple injuries, initial encounter: Secondary | ICD-10-CM

## 2016-02-01 DIAGNOSIS — S29012D Strain of muscle and tendon of back wall of thorax, subsequent encounter: Secondary | ICD-10-CM | POA: Diagnosis not present

## 2016-02-01 DIAGNOSIS — S76211D Strain of adductor muscle, fascia and tendon of right thigh, subsequent encounter: Secondary | ICD-10-CM | POA: Diagnosis not present

## 2016-02-01 DIAGNOSIS — S5011XD Contusion of right forearm, subsequent encounter: Secondary | ICD-10-CM | POA: Diagnosis not present

## 2016-02-01 NOTE — Progress Notes (Addendum)
   Subjective:  Patient ID: Virginia Marks, female    DOB: November 28, 1955  Age: 60 y.o. MRN: [id number]  CC: Worker Comp Follow up (pt here today following up after falling in school parking lot. She is having a lot of pain in her right groin and she is feeling exhausted.)   HPI Virginia Marks presents for Recheck of her Worker's Comp. injury. She has been at light duty seated at work. There is moderate tenderness for range of motion at the knee. There are  large bruises at the medial right thigh. Also at the base of the right thumb.   ROS Review of Systems  Constitutional: Negative for activity change, appetite change and fever.  HENT: Negative for congestion, rhinorrhea and sore throat.   Eyes: Negative for visual disturbance.  Respiratory: Negative for cough and shortness of breath.   Cardiovascular: Negative for chest pain and palpitations.  Gastrointestinal: Negative for abdominal pain, diarrhea and nausea.  Genitourinary: Negative for dysuria.  Musculoskeletal: Positive for arthralgias and myalgias. Negative for joint swelling.    Objective:  BP 130/66   Pulse 65   Temp 98.1 F (36.7 C) (Oral)   Ht 5\' 4"  (1.626 m)   Wt 232 lb 2 oz (105.3 kg)   BMI 39.84 kg/m   Physical Exam  Constitutional: She is oriented to person, place, and time. She appears well-developed and well-nourished. No distress.  Cardiovascular: Normal rate and regular rhythm.   Pulmonary/Chest: Effort normal and breath sounds normal.  Musculoskeletal: She exhibits tenderness (left knee, right forearm, right inner upper thigh).  Tender at right thigh adductor origin at pubis   Neurological: She is alert and oriented to person, place, and time.  Skin: Skin is warm and dry.  Hematoma covers the lateral aspect of the right first MCP to the first right IP. There is full range of motion with mild tenderness in the area.  Hematoma covers the entire medial aspect of the right thigh from the pubic symphysis to the  medial aspect of the knee. There is moderate to moderately severe tenderness at the abductor origin  Psychiatric: She has a normal mood and affect.    Assessment & Plan:   Virginia Marks was seen today for worker comp follow up.  Diagnoses and all orders for this visit:  Multiple contusions -     Ambulatory referral to Physical Therapy  Contusion of right elbow and forearm, subsequent encounter  Groin strain, right, subsequent encounter  Upper back strain, subsequent encounter  Contusion, knee and lower leg, left, subsequent encounter   I am having Ms. Nepomuceno maintain her clobetasol, hydrOXYzine, gabapentin, ibuprofen, sertraline, furosemide, potassium chloride SA, metoprolol tartrate, aspirin, diclofenac, and HYDROcodone-acetaminophen.  No orders of the defined types were placed in this encounter.    Follow-up: Return in about 2 weeks (around 02/15/2016).  Mechele ClaudeWarren Rogen Porte, M.D.

## 2016-02-02 ENCOUNTER — Encounter: Payer: Self-pay | Admitting: Family Medicine

## 2016-02-02 NOTE — Progress Notes (Signed)
Done. Thanks, WS 

## 2016-02-05 DIAGNOSIS — S8002XA Contusion of left knee, initial encounter: Secondary | ICD-10-CM | POA: Insufficient documentation

## 2016-02-05 DIAGNOSIS — S8012XA Contusion of left lower leg, initial encounter: Secondary | ICD-10-CM

## 2016-02-05 DIAGNOSIS — S29012A Strain of muscle and tendon of back wall of thorax, initial encounter: Secondary | ICD-10-CM | POA: Insufficient documentation

## 2016-02-05 DIAGNOSIS — S76219A Strain of adductor muscle, fascia and tendon of unspecified thigh, initial encounter: Secondary | ICD-10-CM | POA: Insufficient documentation

## 2016-02-05 DIAGNOSIS — S5011XA Contusion of right forearm, initial encounter: Secondary | ICD-10-CM | POA: Insufficient documentation

## 2016-02-05 DIAGNOSIS — T07XXXA Unspecified multiple injuries, initial encounter: Secondary | ICD-10-CM | POA: Insufficient documentation

## 2016-02-08 ENCOUNTER — Telehealth: Payer: Self-pay | Admitting: Family Medicine

## 2016-02-11 NOTE — Telephone Encounter (Signed)
Please contact the patient reschedule for a week further out

## 2016-02-11 NOTE — Telephone Encounter (Signed)
Please review and advise.

## 2016-02-11 NOTE — Telephone Encounter (Signed)
Patient aware.

## 2016-02-11 NOTE — Telephone Encounter (Signed)
lmtcb jkp 11/6 

## 2016-02-14 ENCOUNTER — Ambulatory Visit: Payer: Worker's Compensation | Attending: Family Medicine | Admitting: Physical Therapy

## 2016-02-14 ENCOUNTER — Ambulatory Visit: Payer: BC Managed Care – PPO | Admitting: Physical Therapy

## 2016-02-14 ENCOUNTER — Other Ambulatory Visit: Payer: Self-pay

## 2016-02-14 DIAGNOSIS — M25551 Pain in right hip: Secondary | ICD-10-CM

## 2016-02-14 DIAGNOSIS — S76011A Strain of muscle, fascia and tendon of right hip, initial encounter: Secondary | ICD-10-CM

## 2016-02-14 DIAGNOSIS — M79651 Pain in right thigh: Secondary | ICD-10-CM | POA: Insufficient documentation

## 2016-02-14 DIAGNOSIS — S56311S Strain of extensor or abductor muscles, fascia and tendons of right thumb at forearm level, sequela: Secondary | ICD-10-CM

## 2016-02-15 ENCOUNTER — Encounter: Payer: Self-pay | Admitting: Physical Therapy

## 2016-02-15 ENCOUNTER — Ambulatory Visit: Payer: Worker's Compensation | Admitting: Physical Therapy

## 2016-02-15 ENCOUNTER — Other Ambulatory Visit: Payer: Self-pay | Admitting: Family Medicine

## 2016-02-15 DIAGNOSIS — M25551 Pain in right hip: Secondary | ICD-10-CM | POA: Diagnosis present

## 2016-02-15 DIAGNOSIS — M79651 Pain in right thigh: Secondary | ICD-10-CM

## 2016-02-15 NOTE — Therapy (Signed)
[facility] [address] [place], [place], [address] Phone: [phone]   Fax:  [phone]  Physical Therapy Evaluation  Patient Details  Name: Virginia KittenMargaret Taglieri MRN: [id number] Date of Birth: [birth year] Referring Provider: Mechele ClaudeWarren Stacks  Encounter Date: 02/15/2016      PT End of Session - 02/15/16 1126    Visit Number 1   Number of Visits 12   Date for PT Re-Evaluation 03/28/16   PT Start Time 1126   PT Stop Time 1220   PT Time Calculation (min) 54 min   Activity Tolerance Patient tolerated treatment well   Behavior During Therapy [facility] for tasks assessed/performed      Past Medical History:  Diagnosis Date  . Hyperlipidemia   . Hypertension   . Psoriasis    SCALP  . Spinal stenosis     Past Surgical History:  Procedure Laterality Date  . BREAST EXCISIONAL BIOPSY Right 2001   negative  . BREAST EXCISIONAL BIOPSY Right 2002   negative    There were no vitals filed for this visit.       Subjective Assessment - 02/15/16 1127    Subjective Patient fell at work on 02/04/16 and pulled her R adductor brevis muscle. It hurts to get on/off toilet, to move in bed and stairs are difficult. Patient had some back pain present prior to fall, but states that it increased after the fall. It is back to a 3/10 for the most part now.    Pertinent History HTN, OP, spinal stenosis, lumbar fusion 2016; neck fusion 6/17; L3-S1 numerous surgeries previously   How long can you stand comfortably? 3 min   Patient Stated Goals to get rid of pain   Currently in Pain? Yes   Pain Score 4    Pain Location Leg   Pain Orientation Right   Pain Descriptors / Indicators Aching   Pain Type Acute pain   Pain Radiating Towards medial thigh   Pain Onset 1 to 4 weeks ago   Pain Frequency Intermittent   Pain Relieving Factors supine lying   Multiple Pain Sites Yes   Pain Score 3   Pain Location Back   Pain Orientation Right;Left;Lower   Pain  Descriptors / Indicators Burning;Aching   Pain Type Chronic pain   Pain Onset More than a month ago   Pain Frequency Constant            [facility] PT Assessment - 02/15/16 0001      Assessment   Medical Diagnosis R hip pain; R hip strain   Referring Provider Virginia JohnWarren Stacks   Onset Date/Surgical Date 02/04/16     Precautions   Precautions None     Balance Screen   Has the patient fallen in the past 6 months Yes   How many times? 4  tripped, slipped, stepped on dress   Has the patient had a decrease in activity level because of a fear of falling?  Yes   Is the patient reluctant to leave their home because of a fear of falling?  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     Palpation   Palpation comment tenderness of R adductor magnus, longus and brevis, mid quads and at adductor longus attachment at ant pelvis     Ambulation/Gait   Ambulation Distance (Feet) 40 Feet   Gait Pattern Step-through pattern;Decreased stance time - right;Decreased step length - left  demos poor quad control in preswing on R                   [facility] Adult PT Treatment/Exercise - 02/15/16 0001      Modalities   Modalities Electrical Stimulation;Moist Heat     Moist Heat Therapy   Number Minutes Moist Heat 15 Minutes   Moist Heat Location Other (comment)  R adductors/quads     Electrical Stimulation    Electrical Stimulation Location R hip adductors/quads premod 80-150Hz  x 15 min   Electrical Stimulation Goals Pain     Manual Therapy   Manual Therapy Soft tissue mobilization   Soft tissue mobilization to R hip adductors and RF          Trigger Point Dry Needling - 02/15/16 1222    Consent Given? Yes   Education Handout Provided Yes   Muscles Treated Lower Body Adductor longus/brevius/maximus;Quadriceps   Quadriceps Response Twitch response elicited;Palpable increased muscle length  R RF   Adductor Response Twitch response elicited;Palpable increased muscle length  R              PT Education - 02/15/16 1223    Education provided Yes   Education Details DN education and aftercare   Person(s) Educated Patient   Methods Explanation;Demonstration;Handout   Comprehension Verbalized understanding             PT Long Term Goals - 02/15/16 1246      PT LONG TERM GOAL #1   Title I with HEP   Time 2   Period Weeks   Status New     PT LONG TERM GOAL #2   Title Patient able to climb stairs with a reciprocal gait with or without UE support without R leg/hip pain.   Time 6   Period Weeks   Status New     PT LONG TERM GOAL #3   Title Patient to be able to get on/off commode with 1/10 pain or less in R leg/hip.   Time 6   Period Weeks   Status New     PT LONG TERM GOAL #4   Title Patient to be able to stand for ADLs with 1/10 or less pain in R leg/hip.   Time 6   Period Weeks   Status New     PT LONG TERM GOAL #5   Title Patient to be able to ambulate community distances without pain in R hip/leg.   Time 6   Period Weeks   Status New               Plan - 02/15/16 1241    Clinical Impression Statement Patient presents s/p R hip adductor strain on 02/04/16. She has pain with ADLS, decreased ROM and weakness with gait and stairs. Patient also reports intermittent increase in LBP  since her fall. She responded well to trigger point dry needling today  reporting less tightness and improved mobility afterwards.    Rehab Potential Excellent   PT Frequency 2x / week   PT Duration 6 weeks   PT Treatment/Interventions ADLs/Self Care Home Management;Cryotherapy;Electrical Stimulation;Moist Heat;Ultrasound;Gait training;Stair training;Patient/family education;Neuromuscular re-education;Therapeutic exercise;Manual techniques;Passive range of motion;Taping;Dry needling   PT Next Visit Plan Assess response to DN; manual therapy to R hip adductors; PROM into ABD; modalities for pain/spasm.   Consulted and Agree with Plan of Care Patient      Patient will benefit from skilled therapeutic intervention in order to improve the following deficits and impairments:  Abnormal gait, Decreased range of motion, Pain, Decreased activity tolerance, Postural dysfunction, Decreased strength  Visit Diagnosis: Pain in right hip - Plan: PT plan of care cert/re-cert  Pain in right thigh - Plan: PT plan of care cert/re-cert     Problem List Patient Active Problem List   Diagnosis Date Noted  . Contusion, knee and lower leg, left, initial encounter 02/05/2016  . Contusion, forearm and elbow, right, initial encounter 02/05/2016  . Groin strain 02/05/2016  . Upper back strain 02/05/2016  . Hyperlipidemia 10/24/2015  . Benign essential HTN 10/24/2015  . Psoriasis 10/24/2015  . Spinal stenosis 10/24/2015   Solon PalmJulie Tianah Lonardo PT 02/15/2016, 12:54 PM  [facility] [address] [place], [place], [address] Phone: [phone]   Fax:  [phone]  Name: Virginia KittenMargaret Carcione MRN: [id number] Date of Birth: 09-29-1955

## 2016-02-15 NOTE — Patient Instructions (Signed)
Trigger Point Dry Needling  . What is Trigger Point Dry Needling (DN)? o DN is a physical therapy technique used to treat muscle pain and dysfunction. Specifically, DN helps deactivate muscle trigger points (muscle knots).  o A thin filiform needle is used to penetrate the skin and stimulate the underlying trigger point. The goal is for a local twitch response (LTR) to occur and for the trigger point to relax. No medication of any kind is injected during the procedure.   . What Does Trigger Point Dry Needling Feel Like?  o The procedure feels different for each individual patient. Some patients report that they do not actually feel the needle enter the skin and overall the process is not painful. Very mild bleeding may occur. However, many patients feel a deep cramping in the muscle in which the needle was inserted. This is the local twitch response.   Marland Kitchen. How Will I feel after the treatment? o Soreness is normal, and the onset of soreness may not occur for a few hours. Typically this soreness does not last longer than two days.  o Bruising is uncommon, however; ice can be used to decrease any possible bruising.  o In rare cases feeling tired or nauseous after the treatment is normal. In addition, your symptoms may get worse before they get better, this period will typically not last longer than 24 hours.   . What Can I do After My Treatment? o Increase your hydration by drinking more water for the next 24 hours. o You may place ice or heat on the areas treated that have become sore, however, do not use heat on inflamed or bruised areas. Heat often brings more relief post needling. o You can continue your regular activities, but vigorous activity is not recommended initially after the treatment for 24 hours. o DN is best combined with other physical therapy such as strengthening, stretching, and other therapies.    Precautions:  In some cases, dry needling is done over the lung field. While rare,  there is a risk of pneumothorax (punctured lung). Because of this, if you ever experience shortness of breath on exertion, difficulty taking a deep breath, chest pain or a dry cough following dry needling, you should report to an emergency room and tell them that you have been dry needled over the thorax.  Solon PalmJulie Daine Gunther, PT 02/15/16 12:22 PM [facility] [address] [place], [place], [address] Phone: [phone]   Fax:  [phone]

## 2016-02-18 ENCOUNTER — Ambulatory Visit: Payer: Worker's Compensation | Admitting: Physical Therapy

## 2016-02-18 ENCOUNTER — Encounter: Payer: Self-pay | Admitting: Physical Therapy

## 2016-02-18 DIAGNOSIS — M25551 Pain in right hip: Secondary | ICD-10-CM | POA: Diagnosis not present

## 2016-02-18 DIAGNOSIS — M79651 Pain in right thigh: Secondary | ICD-10-CM

## 2016-02-18 NOTE — Therapy (Signed)
[facility] [address] [place], [place], [address] Phone: [phone]   Fax:  [phone]  Physical Therapy Treatment  Patient Details  Name: Virginia Marks MRN: [id number] Date of Birth: 1956-01-17 Referring Provider: Mechele ClaudeWarren Stacks  Encounter Date: 02/18/2016      PT End of Session - 02/18/16 1653    Visit Number 2   Number of Visits 12   Date for PT Re-Evaluation 03/28/16   PT Start Time 1655   PT Stop Time 1758   PT Time Calculation (min) 63 min   Activity Tolerance Patient tolerated treatment well   Behavior During Therapy [facility] for tasks assessed/performed      Past Medical History:  Diagnosis Date  . Hyperlipidemia   . Hypertension   . Psoriasis    SCALP  . Spinal stenosis     Past Surgical History:  Procedure Laterality Date  . BREAST EXCISIONAL BIOPSY Right 2001   negative  . BREAST EXCISIONAL BIOPSY Right 2002   negative    There were no vitals filed for this visit.      Subjective Assessment - 02/18/16 1650    Subjective Reports that she had increased pain when walking and after standing after sitting with pain in legs and hips. R groin is still very sore. Reports that L hip is still hurting.    Pertinent History HTN, OP, spinal stenosis, lumbar fusion 2016; neck fusion 6/17; L3-S1 numerous surgeries previously   How long can you stand comfortably? 3 min   Patient Stated Goals to get rid of pain   Currently in Pain? Yes   Pain Score 3    Pain Location Groin   Pain Orientation Right   Pain Type Acute pain   Pain Onset 1 to 4 weeks ago   Pain Frequency Intermittent  with movement   Pain Score 3   Pain Location Back   Pain Orientation Right;Left;Lower   Pain Type Chronic pain   Pain Onset More than a month ago   Aggravating Factors  Standing after sitting            [facility] PT Assessment - 02/18/16 0001      Assessment   Medical Diagnosis R hip pain; R hip strain   Onset Date/Surgical Date  02/04/16   Next MD Visit 02/27/2016     Precautions   Precautions None                     [facility] Adult PT Treatment/Exercise - 02/18/16 0001      Modalities   Modalities Electrical Stimulation;Moist Heat     Moist Heat Therapy   Number Minutes Moist Heat 15 Minutes   Moist Heat Location Other (comment)  Inner thigh     Electrical Stimulation   Electrical Stimulation Location R hip adductors   Electrical Stimulation Action Pre-Mod   Electrical Stimulation Parameters 80-150 hz x15 min   Electrical Stimulation Goals Pain     Manual Therapy   Manual Therapy Soft tissue mobilization;Passive ROM   Soft tissue mobilization STW to R hip adductors and medial HS as well as B glutes to decrease tightness throughout the length of the muscle   Passive ROM PROM of R hip into abduction to dynamically stretch R hip adductors                     PT Long Term Goals - 02/15/16 1246      PT LONG TERM GOAL #1  Title I with HEP   Time 2   Period Weeks   Status New     PT LONG TERM GOAL #2   Title Patient able to climb stairs with a reciprocal gait with or without UE support without R leg/hip pain.   Time 6   Period Weeks   Status New     PT LONG TERM GOAL #3   Title Patient to be able to get on/off commode with 1/10 pain or less in R leg/hip.   Time 6   Period Weeks   Status New     PT LONG TERM GOAL #4   Title Patient to be able to stand for ADLs with 1/10 or less pain in R leg/hip.   Time 6   Period Weeks   Status New     PT LONG TERM GOAL #5   Title Patient to be able to ambulate community distances without pain in R hip/leg.   Time 6   Period Weeks   Status New               Plan - 02/18/16 1802    Clinical Impression Statement Patient arrived to treatment with R hip adductors discomfort but also discomfort in low back and hips. Tightness and TPs continue to be noted throughout R hip adductors upon palpation. Tightness also noted in  superior and medial B glutes. Normal modalities response noted following removal of the modalities. Patient able to progress PROM of R hip into abduction due to decreased tightness of the hip adductors as PROM session progressed. Bruising along R hip adductors is no longer present upon observation but patient still sore around area of blood vessels in distal inner thigh.   Rehab Potential Excellent   PT Frequency 2x / week   PT Duration 6 weeks   PT Treatment/Interventions ADLs/Self Care Home Management;Cryotherapy;Electrical Stimulation;Moist Heat;Ultrasound;Gait training;Stair training;Patient/family education;Neuromuscular re-education;Therapeutic exercise;Manual techniques;Passive range of motion;Taping;Dry needling   PT Next Visit Plan Continue with manual therapy and PROM of R hip into abduction with modalities PRN per MPT POC.   Consulted and Agree with Plan of Care Patient      Patient will benefit from skilled therapeutic intervention in order to improve the following deficits and impairments:  Abnormal gait, Decreased range of motion, Pain, Decreased activity tolerance, Postural dysfunction, Decreased strength  Visit Diagnosis: Pain in right hip  Pain in right thigh     Problem List Patient Active Problem List   Diagnosis Date Noted  . Contusion, knee and lower leg, left, initial encounter 02/05/2016  . Contusion, forearm and elbow, right, initial encounter 02/05/2016  . Groin strain 02/05/2016  . Upper back strain 02/05/2016  . Hyperlipidemia 10/24/2015  . Benign essential HTN 10/24/2015  . Psoriasis 10/24/2015  . Spinal stenosis 10/24/2015    Evelene CroonKelsey M Parsons, PTA 02/18/2016, 6:07 PM  [facility] [address] [place], [place], [address] Phone: [phone]   Fax:  [phone]  Name: Virginia Marks MRN: [id number] Date of Birth: Jan 08, 1956

## 2016-02-19 ENCOUNTER — Ambulatory Visit: Payer: Worker's Compensation | Admitting: Physical Therapy

## 2016-02-19 ENCOUNTER — Other Ambulatory Visit: Payer: Self-pay | Admitting: *Deleted

## 2016-02-19 ENCOUNTER — Encounter: Payer: Self-pay | Admitting: Physical Therapy

## 2016-02-19 DIAGNOSIS — M25551 Pain in right hip: Secondary | ICD-10-CM | POA: Diagnosis not present

## 2016-02-19 DIAGNOSIS — M79651 Pain in right thigh: Secondary | ICD-10-CM

## 2016-02-19 DIAGNOSIS — S76211A Strain of adductor muscle, fascia and tendon of right thigh, initial encounter: Secondary | ICD-10-CM

## 2016-02-19 NOTE — Therapy (Signed)
[facility] [address] [place], [place], [address] Phone: [phone]   Fax:  [phone]  Physical Therapy Treatment  Patient Details  Name: Virginia Marks MRN: [id number] Date of Birth: [birth year] Referring Provider: Mechele ClaudeWarren Stacks  Encounter Date: 02/19/2016      PT End of Session - 02/19/16 1758    Visit Number 3   Number of Visits 12   Date for PT Re-Evaluation 03/28/16   PT Start Time 1701   PT Stop Time 1753   PT Time Calculation (min) 52 min   Activity Tolerance Patient tolerated treatment well   Behavior During Therapy [facility] for tasks assessed/performed      Past Medical History:  Diagnosis Date  . Hyperlipidemia   . Hypertension   . Psoriasis    SCALP  . Spinal stenosis     Past Surgical History:  Procedure Laterality Date  . BREAST EXCISIONAL BIOPSY Right 2001   negative  . BREAST EXCISIONAL BIOPSY Right 2002   negative    There were no vitals filed for this visit.      Subjective Assessment - 02/19/16 1701    Subjective Reports increased pain with sitting and standing after prolonged sitting. Reports that at times she has no pain. Reports continued pain between B scapula and tightness.   Pertinent History HTN, OP, spinal stenosis, lumbar fusion 2016; neck fusion 6/17; L3-S1 numerous surgeries previously   How long can you stand comfortably? 3 min   Patient Stated Goals to get rid of pain   Currently in Pain? Yes   Pain Score 2    Pain Location Groin   Pain Orientation Right   Pain Type Acute pain   Pain Onset 1 to 4 weeks ago            [facility] PT Assessment - 02/19/16 0001      Assessment   Medical Diagnosis R hip pain; R hip strain   Onset Date/Surgical Date 02/04/16   Next MD Visit 02/27/2016     Precautions   Precautions None                     [facility] Adult PT Treatment/Exercise - 02/19/16 0001      Modalities   Modalities Electrical Stimulation;Moist Heat     Moist  Heat Therapy   Number Minutes Moist Heat 15 Minutes   Moist Heat Location Other (comment)  inner thigh     Electrical Stimulation   Electrical Stimulation Location R hip adductors   Electrical Stimulation Action Pre-Mod   Electrical Stimulation Parameters 80-150 hz x15 min   Electrical Stimulation Goals Pain     Manual Therapy   Manual Therapy Soft tissue mobilization;Passive ROM   Soft tissue mobilization STW to R hip adductors and medial HS as well as B glutes to decrease tightness throughout the length of the muscle   Passive ROM PROM of R hip into abduction to dynamically stretch R hip adductors                     PT Long Term Goals - 02/15/16 1246      PT LONG TERM GOAL #1   Title I with HEP   Time 2   Period Weeks   Status New     PT LONG TERM GOAL #2   Title Patient able to climb stairs with a reciprocal gait with or without UE support without R leg/hip pain.   Time 6  Period Weeks   Status New     PT LONG TERM GOAL #3   Title Patient to be able to get on/off commode with 1/10 pain or less in R leg/hip.   Time 6   Period Weeks   Status New     PT LONG TERM GOAL #4   Title Patient to be able to stand for ADLs with 1/10 or less pain in R leg/hip.   Time 6   Period Weeks   Status New     PT LONG TERM GOAL #5   Title Patient to be able to ambulate community distances without pain in R hip/leg.   Time 6   Period Weeks   Status New               Plan - 02/19/16 1758    Clinical Impression Statement Patient arrived to treatment with continued R adductor pain and soreness especially with sitting and standing following sitting. Tightness present throughout R Adductors and into R Gracilis and medial HS. Palm of hands as well as fingertips used during manual therapy. Tightness located in superior L glute but also in medial R glute. PROM of R hip into abduction completed with patient reporting tightness around pubic symphosis so patient applied  pressure during PROM of R hip into abduction. Normal modalities response noted following removal of the modalities.   Rehab Potential Excellent   PT Frequency 2x / week   PT Duration 6 weeks   PT Treatment/Interventions ADLs/Self Care Home Management;Cryotherapy;Electrical Stimulation;Moist Heat;Ultrasound;Gait training;Stair training;Patient/family education;Neuromuscular re-education;Therapeutic exercise;Manual techniques;Passive range of motion;Taping;Dry needling   PT Next Visit Plan Continue with manual therapy and PROM of R hip into abduction with modalities PRN per MPT POC.   Consulted and Agree with Plan of Care Patient      Patient will benefit from skilled therapeutic intervention in order to improve the following deficits and impairments:  Abnormal gait, Decreased range of motion, Pain, Decreased activity tolerance, Postural dysfunction, Decreased strength  Visit Diagnosis: Pain in right hip  Pain in right thigh     Problem List Patient Active Problem List   Diagnosis Date Noted  . Contusion, knee and lower leg, left, initial encounter 02/05/2016  . Contusion, forearm and elbow, right, initial encounter 02/05/2016  . Groin strain 02/05/2016  . Upper back strain 02/05/2016  . Hyperlipidemia 10/24/2015  . Benign essential HTN 10/24/2015  . Psoriasis 10/24/2015  . Spinal stenosis 10/24/2015    Evelene CroonKelsey M Parsons, PTA 02/19/2016, 6:06 PM  [facility] [address] [place], [place], [address] Phone: [phone]   Fax:  [phone]  Name: Virginia Marks MRN: [id number] Date of Birth: [birth year]

## 2016-02-25 ENCOUNTER — Ambulatory Visit: Payer: Worker's Compensation | Admitting: Physical Therapy

## 2016-02-25 ENCOUNTER — Encounter: Payer: Self-pay | Admitting: Physical Therapy

## 2016-02-25 DIAGNOSIS — M25551 Pain in right hip: Secondary | ICD-10-CM

## 2016-02-25 DIAGNOSIS — M79651 Pain in right thigh: Secondary | ICD-10-CM

## 2016-02-25 NOTE — Therapy (Signed)
[facility] [address] [place], [place], [address] Phone: [phone]   Fax:  [phone]  Physical Therapy Treatment  Patient Details  Name: Virginia Marks MRN: [id number] Date of Birth: 11-26-1955 Referring Provider: Mechele Claude  Encounter Date: 02/25/2016      PT End of Session - 02/25/16 1611    Visit Number 4   Number of Visits 12   Date for PT Re-Evaluation 03/28/16   PT Start Time 1602   PT Stop Time 1651   PT Time Calculation (min) 49 min   Activity Tolerance Patient tolerated treatment well   Behavior During Therapy [facility] for tasks assessed/performed      Past Medical History:  Diagnosis Date  . Hyperlipidemia   . Hypertension   . Psoriasis    SCALP  . Spinal stenosis     Past Surgical History:  Procedure Laterality Date  . BREAST EXCISIONAL BIOPSY Right 2001   negative  . BREAST EXCISIONAL BIOPSY Right 2002   negative    There were no vitals filed for this visit.      Subjective Assessment - 02/25/16 1605    Subjective Reports that over the weekend she had more mid and low back pain but thinks that may be from bending over more. Really tried to focus on golfer pick up or squatting to pick up objects after pain began. Reports having more pelvic centered pain along origin of adductors. Reports L hip discomfort as well. Reports that everything feels stiff to her than prior to her fall. Reports discomfort with bed mobility especially with RUE.   Pertinent History HTN, OP, spinal stenosis, lumbar fusion 2016; neck fusion 6/17; L3-S1 numerous surgeries previously   How long can you stand comfortably? 3 min   Patient Stated Goals to get rid of pain   Currently in Pain? Yes   Pain Score 4    Pain Location Groin   Pain Orientation Right   Pain Type Acute pain   Pain Onset 1 to 4 weeks ago   Pain Frequency Intermittent  hip abduction, bed mobility   Pain Location Back  Trouble with correct posture and with  rolling over in bed   Pain Orientation Mid   Pain Descriptors / Indicators Tightness   Pain Type Chronic pain   Pain Onset More than a month ago            [facility] PT Assessment - 02/25/16 0001      Assessment   Medical Diagnosis R hip pain; R hip strain   Onset Date/Surgical Date 02/04/16   Next MD Visit 02/27/2016     Precautions   Precautions None                     [facility] Adult PT Treatment/Exercise - 02/25/16 0001      Modalities   Modalities Electrical Stimulation;Moist Heat     Moist Heat Therapy   Number Minutes Moist Heat 15 Minutes   Moist Heat Location Other (comment)  R groin/ hip adductors     Electrical Stimulation   Electrical Stimulation Location R hip adductors   Electrical Stimulation Action Pre-mod   Electrical Stimulation Parameters 80-150 hz x15 min   Electrical Stimulation Goals Pain     Manual Therapy   Manual Therapy Soft tissue mobilization;Passive ROM   Soft tissue mobilization STW to R hip adductors and medial HS as well as B glutes to decrease tightness throughout the length of the muscle  Passive ROM PROM of R hip into abduction to dynamically stretch R hip adductors                     PT Long Term Goals - 02/25/16 1618      PT LONG TERM GOAL #1   Title I with HEP   Time 2   Period Weeks   Status On-going     PT LONG TERM GOAL #2   Title Patient able to climb stairs with a reciprocal gait with or without UE support without R leg/hip pain.   Time 6   Period Weeks   Status On-going  Nonreciprical stair gait with increased UE support and assist per patient report 02/25/2016     PT LONG TERM GOAL #3   Title Patient to be able to get on/off commode with 1/10 pain or less in R leg/hip.   Time 6   Period Weeks   Status On-going  Has bought a grab bar and uses counter to push up; 2/10 R adductor pain 02/25/2016     PT LONG TERM GOAL #4   Title Patient to be able to stand for ADLs with 1/10 or less pain  in R leg/hip.   Time 6   Period Weeks   Status On-going  Pain in back and hips after sitting for prolonged time 02/25/2016     PT LONG TERM GOAL #5   Title Patient to be able to ambulate community distances without pain in R hip/leg.   Time 6   Period Weeks   Status On-going               Plan - 02/25/16 1704    Clinical Impression Statement Patient arrived to treatment with continued back/ L hip discomfort but somewhat decreased R hip adductor tightness and discomfort. Patient presented with continued TPs and decreased tightness of the R adductors. Minimal tightness noted especially in L medial glute and R superior glute tightness noted today in prone. Normal modalites response noted following removal of the modalities. Soreness initially noted with manual therapy to R adductors but patient reported manual therapy noted as sore or tender as in previous treatments. Patient reported as saying pubic symphosis discomfort and "knot" had decreased.   Rehab Potential Excellent   PT Frequency 2x / week   PT Duration 6 weeks   PT Treatment/Interventions ADLs/Self Care Home Management;Cryotherapy;Electrical Stimulation;Moist Heat;Ultrasound;Gait training;Stair training;Patient/family education;Neuromuscular re-education;Therapeutic exercise;Manual techniques;Passive range of motion;Taping;Dry needling   PT Next Visit Plan Continue with manual therapy and PROM of R hip into abduction with modalities PRN per MPT POC.   Consulted and Agree with Plan of Care Patient      Patient will benefit from skilled therapeutic intervention in order to improve the following deficits and impairments:  Abnormal gait, Decreased range of motion, Pain, Decreased activity tolerance, Postural dysfunction, Decreased strength  Visit Diagnosis: Pain in right hip  Pain in right thigh     Problem List Patient Active Problem List   Diagnosis Date Noted  . Contusion, knee and lower leg, left, initial encounter  02/05/2016  . Contusion, forearm and elbow, right, initial encounter 02/05/2016  . Groin strain 02/05/2016  . Upper back strain 02/05/2016  . Hyperlipidemia 10/24/2015  . Benign essential HTN 10/24/2015  . Psoriasis 10/24/2015  . Spinal stenosis 10/24/2015   Florence CannerKelsey Parsons, PTA 02/25/16 5:21 PM [place]had Applegate MPT [facility] [address] [place], [place], [address] Phone: [phone]  Fax:  [phone]  Name: Jeannine KittenMargaret Demmer MRN: [id number] Date of Birth: Mar 31, 1956

## 2016-02-27 ENCOUNTER — Encounter: Payer: Self-pay | Admitting: Family Medicine

## 2016-02-27 ENCOUNTER — Ambulatory Visit (INDEPENDENT_AMBULATORY_CARE_PROVIDER_SITE_OTHER): Payer: Worker's Compensation | Admitting: Family Medicine

## 2016-02-27 ENCOUNTER — Encounter: Payer: Self-pay | Admitting: Physical Therapy

## 2016-02-27 ENCOUNTER — Other Ambulatory Visit: Payer: Self-pay | Admitting: Family Medicine

## 2016-02-27 ENCOUNTER — Ambulatory Visit (INDEPENDENT_AMBULATORY_CARE_PROVIDER_SITE_OTHER): Payer: Worker's Compensation

## 2016-02-27 ENCOUNTER — Ambulatory Visit: Payer: Worker's Compensation | Admitting: Physical Therapy

## 2016-02-27 VITALS — BP 126/75 | HR 62 | Temp 97.9°F | Ht 64.0 in | Wt 230.5 lb

## 2016-02-27 DIAGNOSIS — S5011XD Contusion of right forearm, subsequent encounter: Secondary | ICD-10-CM

## 2016-02-27 DIAGNOSIS — S29012D Strain of muscle and tendon of back wall of thorax, subsequent encounter: Secondary | ICD-10-CM | POA: Diagnosis not present

## 2016-02-27 DIAGNOSIS — M79651 Pain in right thigh: Secondary | ICD-10-CM

## 2016-02-27 DIAGNOSIS — S5011XA Contusion of right forearm, initial encounter: Secondary | ICD-10-CM

## 2016-02-27 DIAGNOSIS — M25551 Pain in right hip: Secondary | ICD-10-CM | POA: Diagnosis not present

## 2016-02-27 DIAGNOSIS — S76211D Strain of adductor muscle, fascia and tendon of right thigh, subsequent encounter: Secondary | ICD-10-CM | POA: Diagnosis not present

## 2016-02-27 DIAGNOSIS — S76211A Strain of adductor muscle, fascia and tendon of right thigh, initial encounter: Secondary | ICD-10-CM

## 2016-02-27 NOTE — Progress Notes (Signed)
Subjective:  Patient ID: Virginia Marks, female    DOB: [birth year]  Age: 60 y.o. MRN: [id number]  CC: Workers Comp F/u (pt here today and is still having some pain but only a 2 on the pain scale)   HPI Virginia Marks presents for recheck of fall over her rolling bag and tripped in the parking lot at the local [employer]. She fell on her left knee and right forearm and wrenched her back. She has a history of spinal fusion L3-S1 done in the recent past. Earlier this year she had a cervical fusion. Her pain is just right of the spine in theT9 and T10 region. Added it to PT order last week. Some relief. However, her groin pain is much better. It only hurts shen she steps "a certain way."  She says she never felt a pain like she felt in the groin when she fell. She is able to walk. Mild to moderate pain at the right hip persists History Virginia Marks has a past medical history of Hyperlipidemia; Hypertension; Psoriasis; and Spinal stenosis.   She has a past surgical history that includes Breast excisional biopsy (Right, 2001) and Breast excisional biopsy (Right, 2002).   Her family history includes Breast cancer (age of onset: 3460) in her sister; Breast cancer (age of onset: 4770) in her maternal aunt, maternal aunt, and maternal aunt; Breast cancer (age of onset: 280) in her maternal aunt; Hypertension in her mother.She reports that she has never smoked. She has never used smokeless tobacco. She reports that she does not drink alcohol. Her drug history is not on file.  Current Outpatient Prescriptions on File Prior to Visit  Medication Sig Dispense Refill  . aspirin 325 MG tablet Take 325 mg by mouth daily.    . clobetasol (TEMOVATE) 0.05 % external solution Apply 1 application topically 2 (two) times daily.    . diclofenac (VOLTAREN) 75 MG EC tablet Take 1 tablet (75 mg total) by mouth 2 (two) times daily. For muscle and  Joint pain 60 tablet 2  . gabapentin (NEURONTIN) 300 MG capsule Take 300 mg by  mouth 2 (two) times daily.     . hydrOXYzine (ATARAX/VISTARIL) 25 MG tablet Take 25 mg by mouth daily. 1-2 tABLETS @BEDTIME     . potassium chloride SA (K-DUR,KLOR-CON) 20 MEQ tablet TAKE 1 TAB DAILY ON DAYS TAKING LASIX 90 tablet 3  . sertraline (ZOLOFT) 100 MG tablet Take 100 mg by mouth daily.    . traMADol (ULTRAM) 50 MG tablet Take by mouth every 6 (six) hours as needed.    . furosemide (LASIX) 20 MG tablet Take 1 tablet (20 mg total) by mouth daily as needed. 90 tablet 3  . metoprolol tartrate (LOPRESSOR) 25 MG tablet Take 0.5 tablets (12.5 mg total) by mouth 2 (two) times daily. 45 tablet 3   No current facility-administered medications on file prior to visit.     ROS Review of Systems  Constitutional: Negative for activity change, appetite change and fever.  HENT: Negative for congestion, rhinorrhea and sore throat.   Eyes: Negative for visual disturbance.  Respiratory: Negative for cough and shortness of breath.   Cardiovascular: Negative for chest pain and palpitations.  Gastrointestinal: Negative for abdominal pain, diarrhea and nausea.  Genitourinary: Negative for dysuria.  Musculoskeletal: Positive for arthralgias and myalgias. Negative for joint swelling.    Objective:  BP 126/75   Pulse 62   Temp 97.9 F (36.6 C) (Oral)   Ht 5\' 4"  (1.626 m)  Wt 230 lb 8 oz (104.6 kg)   BMI 39.57 kg/m   Physical Exam  Constitutional: She is oriented to person, place, and time. She appears well-developed and well-nourished. She appears distressed.  HENT:  Head: Normocephalic and atraumatic.  Cardiovascular: Normal rate and regular rhythm.   No murmur heard. Pulmonary/Chest: Effort normal and breath sounds normal.  Abdominal: Soft. There is no tenderness.  Musculoskeletal: She exhibits tenderness (persists at right forearm).  Neurological: She is alert and oriented to person, place, and time.  Skin: Skin is warm and dry.  Psychiatric: She has a normal mood and affect.     Assessment & Plan:   Virginia Marks was seen today for workers comp f/u.  Diagnoses and all orders for this visit:  Contusion, forearm and elbow, right, initial encounter -     DG Forearm Right; Future  Groin strain, right, initial encounter  Upper back strain, subsequent encounter   I have discontinued Ms. Barnett ApplebaumJones's ibuprofen and HYDROcodone-acetaminophen. I am also having her maintain her clobetasol, hydrOXYzine, gabapentin, sertraline, furosemide, potassium chloride SA, metoprolol tartrate, aspirin, diclofenac, and traMADol.  Continue therapy, add right forearm tx  Follow-up: Return in about 2 weeks (around 03/12/2016).  Mechele ClaudeWarren Alayzha An, M.D.

## 2016-02-27 NOTE — Therapy (Signed)
[facility] [address] [place], [place], [address] Phone: [phone]   Fax:  [phone]  Physical Therapy Treatment  Patient Details  Name: Virginia KittenMargaret Marks MRN: [id number] Date of Birth: [birth year] Referring Provider: Mechele ClaudeWarren Stacks  Encounter Date: 02/27/2016      PT End of Session - 02/27/16 1433    Visit Number 5   Number of Visits 12   Date for PT Re-Evaluation 03/28/16   PT Start Time 1438   PT Stop Time 1523   PT Time Calculation (min) 45 min   Activity Tolerance Patient tolerated treatment well   Behavior During Therapy [facility] for tasks assessed/performed      Past Medical History:  Diagnosis Date  . Hyperlipidemia   . Hypertension   . Psoriasis    SCALP  . Spinal stenosis     Past Surgical History:  Procedure Laterality Date  . BREAST EXCISIONAL BIOPSY Right 2001   negative  . BREAST EXCISIONAL BIOPSY Right 2002   negative    There were no vitals filed for this visit.      Subjective Assessment - 02/27/16 1433    Subjective Reports that she has seen Dr. Darlyn ReadStacks today and reports that he said to continue PT and had upper back added to referrals per patient. Reports that her forearm was re xrayed and there appears to be no fractures. Reports that she still has some pain with getting on and of the toilet but a lot better than previously.   Pertinent History HTN, OP, spinal stenosis, lumbar fusion 2016; neck fusion 6/17; L3-S1 numerous surgeries previously   How long can you stand comfortably? 3 min   Patient Stated Goals to get rid of pain   Currently in Pain? Yes   Pain Score 2    Pain Location Groin   Pain Orientation Right   Pain Descriptors / Indicators Aching   Pain Type Acute pain   Pain Onset 1 to 4 weeks ago   Pain Frequency Intermittent  with certain movement            [facility] PT Assessment - 02/27/16 0001      Assessment   Medical Diagnosis R hip pain; R hip strain   Onset Date/Surgical  Date 02/04/16   Next MD Visit 03/2016     Precautions   Precautions None                     [facility] Adult PT Treatment/Exercise - 02/27/16 0001      Modalities   Modalities Electrical Stimulation;Moist Heat     Moist Heat Therapy   Number Minutes Moist Heat 15 Minutes   Moist Heat Location Other (comment)  R groin/ adductors     Electrical Stimulation   Electrical Stimulation Location R hip adductors   Electrical Stimulation Action Pre-mod   Electrical Stimulation Parameters 80-150 hz x15 min   Electrical Stimulation Goals Pain     Manual Therapy   Manual Therapy Soft tissue mobilization;Passive ROM   Soft tissue mobilization STW to R hip adductors and medial HS as well as B glutes to decrease tightness throughout the length of the muscle   Passive ROM PROM of R hip into abduction to dynamically stretch R hip adductors                     PT Long Term Goals - 02/27/16 1447      PT LONG TERM GOAL #1   Title  I with HEP   Time 2   Period Weeks   Status On-going     PT LONG TERM GOAL #2   Title Patient able to climb stairs with a reciprocal gait with or without UE support without R leg/hip pain.   Time 6   Period Weeks   Status On-going  Nonreciprical stair gait with increased UE support and assist per patient report 02/25/2016     PT LONG TERM GOAL #3   Title Patient to be able to get on/off commode with 1/10 pain or less in R leg/hip.   Time 6   Period Weeks   Status On-going  Reports 3/10 pain with standing after sitting on toilet per patient report 02/27/2016     PT LONG TERM GOAL #4   Title Patient to be able to stand for ADLs with 1/10 or less pain in R leg/hip.   Time 6   Period Weeks   Status On-going  Pain in back and hips after sitting for prolonged time 02/25/2016     PT LONG TERM GOAL #5   Title Patient to be able to ambulate community distances without pain in R hip/leg.   Time 6   Period Weeks   Status On-going  Only  required walking, no recreational walking per patient. Sacrum and hip discomfort per patient report 02/27/2016               Plan - 02/27/16 1529    Clinical Impression Statement Patient arrived to treatment with continued discomfort in multiple regions secondary to her fall 02/04/2016 but improvements noted today. Decreased TPs and tightness indicated in R hip adductors and medial HS. Overall decreased tightness noted in B glutes as well today upon palpation. Goals were updated according to patient report with improvement in bathroom use pain although standing and stairs still difficult for patient and she does not complete them more than what is required. Normal modalities response noted following removal of the modalities.   Rehab Potential Excellent   PT Frequency 2x / week   PT Duration 6 weeks   PT Treatment/Interventions ADLs/Self Care Home Management;Cryotherapy;Electrical Stimulation;Moist Heat;Ultrasound;Gait training;Stair training;Patient/family education;Neuromuscular re-education;Therapeutic exercise;Manual techniques;Passive range of motion;Taping;Dry needling   PT Next Visit Plan Continue with manual therapy and PROM of R hip into abduction with modalities PRN per MPT POC.   Consulted and Agree with Plan of Care Patient      Patient will benefit from skilled therapeutic intervention in order to improve the following deficits and impairments:  Abnormal gait, Decreased range of motion, Pain, Decreased activity tolerance, Postural dysfunction, Decreased strength  Visit Diagnosis: Pain in right hip  Pain in right thigh     Problem List Patient Active Problem List   Diagnosis Date Noted  . Contusion, knee and lower leg, left, initial encounter 02/05/2016  . Contusion, forearm and elbow, right, initial encounter 02/05/2016  . Groin strain 02/05/2016  . Upper back strain 02/05/2016  . Hyperlipidemia 10/24/2015  . Benign essential HTN 10/24/2015  . Psoriasis  10/24/2015  . Spinal stenosis 10/24/2015    Evelene CroonKelsey M Parsons, PTA 02/27/2016, 3:39 PM  [facility] [address] [place], [place], [address] Phone: [phone]   Fax:  [phone]  Name: Virginia KittenMargaret Pruitt MRN: [id number] Date of Birth: [birth year]

## 2016-03-04 ENCOUNTER — Encounter: Payer: Self-pay | Admitting: Physical Therapy

## 2016-03-04 ENCOUNTER — Ambulatory Visit: Payer: Worker's Compensation | Admitting: Physical Therapy

## 2016-03-04 DIAGNOSIS — M25551 Pain in right hip: Secondary | ICD-10-CM

## 2016-03-04 DIAGNOSIS — M79651 Pain in right thigh: Secondary | ICD-10-CM

## 2016-03-04 NOTE — Therapy (Signed)
[facility] [address] [place], [place], [address] Phone: [phone]   Fax:  [phone]  Physical Therapy Treatment  Patient Details  Name: Virginia KittenMargaret Halterman MRN: [id number] Date of Birth: March 02, 1956 Referring Provider: Mechele ClaudeWarren Stacks  Encounter Date: 03/04/2016      PT End of Session - 03/04/16 1656    Visit Number 6   Number of Visits 12   Date for PT Re-Evaluation 03/28/16   PT Start Time 1652   PT Stop Time 1738   PT Time Calculation (min) 46 min   Activity Tolerance Patient tolerated treatment well   Behavior During Therapy [facility] for tasks assessed/performed      Past Medical History:  Diagnosis Date  . Hyperlipidemia   . Hypertension   . Psoriasis    SCALP  . Spinal stenosis     Past Surgical History:  Procedure Laterality Date  . BREAST EXCISIONAL BIOPSY Right 2001   negative  . BREAST EXCISIONAL BIOPSY Right 2002   negative    There were no vitals filed for this visit.      Subjective Assessment - 03/04/16 1653    Subjective Reports less pain with getting up and down from toilet and reports decreased pain in hip as well with walking. Reports she has been avoiding stairs. Reports that she has had some pain free times but still has pain. Reports that R thorax has pain with moving in bed and also reports continued forearm pain on the radial side. Reports glute weakness feeling and soreness upon laying in supine during treatment.   Pertinent History HTN, OP, spinal stenosis, lumbar fusion 2016; neck fusion 6/17; L3-S1 numerous surgeries previously   How long can you stand comfortably? 20 min   Patient Stated Goals to get rid of pain   Currently in Pain? Yes   Pain Score 2    Pain Location Groin   Pain Orientation Right   Pain Descriptors / Indicators Aching   Pain Type Acute pain   Pain Onset 1 to 4 weeks ago   Pain Frequency Intermittent   Pain Score --  Reports aching with rest in forearm   Pain Location  Arm   Pain Orientation Right;Distal   Pain Descriptors / Indicators Aching   Pain Type Chronic pain   Pain Onset More than a month ago            [facility] - 03/04/16 0001      Assessment   Medical Diagnosis R hip pain; R hip strain   Onset Date/Surgical Date 02/04/16   Next MD Visit 03/2016     Precautions   Precautions None                     [facility] Adult PT Treatment/Exercise - 03/04/16 0001      Modalities   Modalities Electrical Stimulation;Moist Heat     Moist Heat Therapy   Number Minutes Moist Heat 15 Minutes   Moist Heat Location Other (comment)  R medial thigh     Electrical Stimulation   Electrical Stimulation Location R hip adductors   Electrical Stimulation Action Pre-mod   Electrical Stimulation Parameters 80-150 hz x15 min   Electrical Stimulation Goals Pain     Manual Therapy   Manual Therapy Soft tissue mobilization;Passive ROM   Soft tissue mobilization STW to R hip adductors and medial HS as well as B glutes to decrease tightness throughout the length of the muscle   Passive ROM  PROM of R hip into abduction to dynamically stretch R hip adductors                     PT Long Term Goals - 02/27/16 1447      PT LONG TERM GOAL #1   Title I with HEP   Time 2   Period Weeks   Status On-going     PT LONG TERM GOAL #2   Title Patient able to climb stairs with a reciprocal gait with or without UE support without R leg/hip pain.   Time 6   Period Weeks   Status On-going  Nonreciprical stair gait with increased UE support and assist per patient report 02/25/2016     PT LONG TERM GOAL #3   Title Patient to be able to get on/off commode with 1/10 pain or less in R leg/hip.   Time 6   Period Weeks   Status On-going  Reports 3/10 pain with standing after sitting on toilet per patient report 02/27/2016     PT LONG TERM GOAL #4   Title Patient to be able to stand for ADLs with 1/10 or less pain in R leg/hip.    Time 6   Period Weeks   Status On-going  Pain in back and hips after sitting for prolonged time 02/25/2016     PT LONG TERM GOAL #5   Title Patient to be able to ambulate community distances without pain in R hip/leg.   Time 6   Period Weeks   Status On-going  Only required walking, no recreational walking per patient. Sacrum and hip discomfort per patient report 02/27/2016               Plan - 03/04/16 1743    Clinical Impression Statement Patient arrived to treatment with reports of some improvement following R thigh and hip injuries. Patient presented with decreased tightness and TPs throughout R hip adductors although there was a small line of tightness that ran approximately in region of R medial HS. Tightness noted predominately in L glute with overpressure utilized in L mid buttock per patient request to release pressure. Patient experienced greatest sensitivity over L SI joint today. Normal modalities response noted following removal of hte modalities. Patient able to tolerate prolonged standing times now per patient report and greater ease with getting onto and off of toilet.   Rehab Potential Excellent   PT Frequency 2x / week   PT Duration 6 weeks   PT Treatment/Interventions ADLs/Self Care Home Management;Cryotherapy;Electrical Stimulation;Moist Heat;Ultrasound;Gait training;Stair training;Patient/family education;Neuromuscular re-education;Therapeutic exercise;Manual techniques;Passive range of motion;Taping;Dry needling   PT Next Visit Plan Continue with manual therapy and PROM of R hip into abduction with modalities PRN per MPT POC.   Consulted and Agree with Plan of Care Patient      Patient will benefit from skilled therapeutic intervention in order to improve the following deficits and impairments:  Abnormal gait, Decreased range of motion, Pain, Decreased activity tolerance, Postural dysfunction, Decreased strength  Visit Diagnosis: Pain in right hip  Pain in  right thigh     Problem List Patient Active Problem List   Diagnosis Date Noted  . Contusion, knee and lower leg, left, initial encounter 02/05/2016  . Contusion, forearm and elbow, right, initial encounter 02/05/2016  . Groin strain 02/05/2016  . Upper back strain 02/05/2016  . Hyperlipidemia 10/24/2015  . Benign essential HTN 10/24/2015  . Psoriasis 10/24/2015  . Spinal stenosis 10/24/2015    Malka So  Doyne Keelarsons, PTA 03/04/2016, 5:48 PM  [facility] [address] [place], [place], [address] Phone: [phone]   Fax:  [phone]  Name: Virginia KittenMargaret Marks MRN: [id number] Date of Birth: 09/26/1955

## 2016-03-06 ENCOUNTER — Ambulatory Visit: Payer: Worker's Compensation | Admitting: Physical Therapy

## 2016-03-06 ENCOUNTER — Encounter: Payer: Self-pay | Admitting: Physical Therapy

## 2016-03-06 DIAGNOSIS — M79651 Pain in right thigh: Secondary | ICD-10-CM

## 2016-03-06 DIAGNOSIS — M25551 Pain in right hip: Secondary | ICD-10-CM

## 2016-03-06 NOTE — Therapy (Signed)
[facility] [address] [place], [place], [address] Phone: [phone]   Fax:  [phone]  Physical Therapy Treatment  Patient Details  Name: Virginia Marks Rashad MRN: [id number] Date of Birth: Dec 13, 1955 Referring Provider: Mechele ClaudeWarren Stacks  Encounter Date: 03/06/2016      PT End of Session - 03/06/16 1702    Visit Number 7   Number of Visits 12   Date for PT Re-Evaluation 03/28/16   PT Start Time 1658   PT Stop Time 1741   PT Time Calculation (min) 43 min   Activity Tolerance Patient tolerated treatment well   Behavior During Therapy [facility] for tasks assessed/performed      Past Medical History:  Diagnosis Date  . Hyperlipidemia   . Hypertension   . Psoriasis    SCALP  . Spinal stenosis     Past Surgical History:  Procedure Laterality Date  . BREAST EXCISIONAL BIOPSY Right 2001   negative  . BREAST EXCISIONAL BIOPSY Right 2002   negative    There were no vitals filed for this visit.      Subjective Assessment - 03/06/16 1652    Subjective Reports that she had difficulty walking secondary to tightness in B glutes. Reports that R leg is better and she did better with stairs. Reports that she has felt today like she is back almost to the point she was prior to fall. Reports she is getting better sleep as well and is able to sleep for longer periods of time without waking and decreased pain with bed mobility.   Pertinent History HTN, OP, spinal stenosis, lumbar fusion 2016; neck fusion 6/17; L3-S1 numerous surgeries previously   How long can you stand comfortably? 20 min   Patient Stated Goals to get rid of pain   Currently in Pain? Yes   Pain Score 1    Pain Location Groin   Pain Orientation Right   Pain Type Acute pain   Pain Onset 1 to 4 weeks ago            [facility] Assessment - 03/06/16 0001      Assessment   Medical Diagnosis R hip pain; R hip strain   Onset Date/Surgical Date 02/04/16   Next MD Visit  03/2016     Precautions   Precautions None                     [facility] Adult PT Treatment/Exercise - 03/06/16 0001      Modalities   Modalities Electrical Stimulation;Moist Heat     Moist Heat Therapy   Number Minutes Moist Heat 15 Minutes   Moist Heat Location Hip     Electrical Stimulation   Electrical Stimulation Location B posterior hip/ buttock   Electrical Stimulation Action Pre-Mod   Electrical Stimulation Parameters 80-150 hz x15 min   Electrical Stimulation Goals Pain     Manual Therapy   Manual Therapy Soft tissue mobilization;Passive ROM   Soft tissue mobilization STW to R hip adductors and medial HS as well as B glutes to decrease tightness throughout the length of the muscle   Passive ROM PROM of R hip into abduction to dynamically stretch R hip adductors                     PT Long Term Goals - 02/27/16 1447      PT LONG TERM GOAL #1   Title I with HEP   Time 2   Period  Weeks   Status On-going     PT LONG TERM GOAL #2   Title Patient able to climb stairs with a reciprocal gait with or without UE support without R leg/hip pain.   Time 6   Period Weeks   Status On-going  Nonreciprical stair gait with increased UE support and assist per patient report 02/25/2016     PT LONG TERM GOAL #3   Title Patient to be able to get on/off commode with 1/10 pain or less in R leg/hip.   Time 6   Period Weeks   Status On-going  Reports 3/10 pain with standing after sitting on toilet per patient report 02/27/2016     PT LONG TERM GOAL #4   Title Patient to be able to stand for ADLs with 1/10 or less pain in R leg/hip.   Time 6   Period Weeks   Status On-going  Pain in back and hips after sitting for prolonged time 02/25/2016     PT LONG TERM GOAL #5   Title Patient to be able to ambulate community distances without pain in R hip/leg.   Time 6   Period Weeks   Status On-going  Only required walking, no recreational walking per patient.  Sacrum and hip discomfort per patient report 02/27/2016               Plan - 03/06/16 1749    Clinical Impression Statement Patient arrived to treatment with decreased R adductor pain and overall functional improvement. Patient able to maneuver stairs with greater ease today although she had increased posterior hip/glute tightness and discomfort while ambulating today. Decreased R adductors tightness noted with palpation and slightly increased tightness in R medial HS. Patient experienced less overall sensitiivty and soreness to manual therapy today per patient report. Normal modalities response noted following removal of the modalities.    Rehab Potential Excellent   PT Frequency 2x / week   PT Duration 6 weeks   PT Treatment/Interventions ADLs/Self Care Home Management;Cryotherapy;Electrical Stimulation;Moist Heat;Ultrasound;Gait training;Stair training;Patient/family education;Neuromuscular re-education;Therapeutic exercise;Manual techniques;Passive range of motion;Taping;Dry needling   PT Next Visit Plan Continue with manual therapy and PROM of R hip into abduction with modalities PRN per MPT POC.   Consulted and Agree with Plan of Care Patient      Patient will benefit from skilled therapeutic intervention in order to improve the following deficits and impairments:  Abnormal gait, Decreased range of motion, Pain, Decreased activity tolerance, Postural dysfunction, Decreased strength  Visit Diagnosis: Pain in right hip  Pain in right thigh     Problem List Patient Active Problem List   Diagnosis Date Noted  . Contusion, knee and lower leg, left, initial encounter 02/05/2016  . Contusion, forearm and elbow, right, initial encounter 02/05/2016  . Groin strain 02/05/2016  . Upper back strain 02/05/2016  . Hyperlipidemia 10/24/2015  . Benign essential HTN 10/24/2015  . Psoriasis 10/24/2015  . Spinal stenosis 10/24/2015    Evelene CroonKelsey M Parsons, PTA 03/06/2016, 6:01 PM  [facility] [address] [place], [place], [address] Phone: [phone]   Fax:  [phone]  Name: Virginia Marks Bollier MRN: [id number] Date of Birth: 1955/05/14

## 2016-03-11 ENCOUNTER — Encounter: Payer: Self-pay | Admitting: Physical Therapy

## 2016-03-11 ENCOUNTER — Ambulatory Visit: Payer: Worker's Compensation | Attending: Family Medicine | Admitting: Physical Therapy

## 2016-03-11 DIAGNOSIS — M79631 Pain in right forearm: Secondary | ICD-10-CM | POA: Insufficient documentation

## 2016-03-11 DIAGNOSIS — M25551 Pain in right hip: Secondary | ICD-10-CM | POA: Insufficient documentation

## 2016-03-11 DIAGNOSIS — M546 Pain in thoracic spine: Secondary | ICD-10-CM | POA: Insufficient documentation

## 2016-03-11 DIAGNOSIS — M79651 Pain in right thigh: Secondary | ICD-10-CM | POA: Insufficient documentation

## 2016-03-11 NOTE — Therapy (Signed)
[facility] [address] [place], [place], [address] Phone: [phone]   Fax:  [phone]  Physical Therapy Treatment  Patient Details  Name: Virginia KittenMargaret Hada MRN: [id number] Date of Birth: 03/16/1956 Referring Provider: Mechele ClaudeWarren Stacks  Encounter Date: 03/11/2016      PT End of Session - 03/11/16 1656    Visit Number 8   Number of Visits 12   Date for PT Re-Evaluation 03/28/16   PT Start Time 1705   PT Stop Time 1736  2 units secondary to patient denial of modalities   PT Time Calculation (min) 31 min   Activity Tolerance Patient tolerated treatment well   Behavior During Therapy [facility] for tasks assessed/performed      Past Medical History:  Diagnosis Date  . Hyperlipidemia   . Hypertension   . Psoriasis    SCALP  . Spinal stenosis     Past Surgical History:  Procedure Laterality Date  . BREAST EXCISIONAL BIOPSY Right 2001   negative  . BREAST EXCISIONAL BIOPSY Right 2002   negative    There were no vitals filed for this visit.      Subjective Assessment - 03/11/16 1653    Subjective Reports that she has been having R forearm pain as well as sacrum pain and pain into thoracic region. Reports that she tried doing hip clamshell to see how her leg was and barely had any pain.   Pertinent History HTN, OP, spinal stenosis, lumbar fusion 2016; neck fusion 6/17; L3-S1 numerous surgeries previously   How long can you stand comfortably? 20 min   Patient Stated Goals to get rid of pain   Currently in Pain? Yes   Pain Score --  "less than a one"   Pain Location Groin   Pain Orientation Right   Pain Type Acute pain   Pain Onset 1 to 4 weeks ago   Pain Frequency Intermittent  with hip ER at end range   Pain Score 1   Pain Location Sacrum   Pain Descriptors / Indicators Tightness   Pain Type Chronic pain   Pain Onset More than a month ago            [facility] - 03/11/16 0001      Assessment   Medical  Diagnosis R hip pain; R hip strain   Onset Date/Surgical Date 02/04/16   Next MD Visit 03/2016     Precautions   Precautions None                     [facility] Adult PT Treatment/Exercise - 03/11/16 0001      Manual Therapy   Manual Therapy Myofascial release   Myofascial Release MFR/STW to B posterior hip/glute musculature to decrease tightness and discomfort in prone over 2 pillows                     PT Long Term Goals - 02/27/16 1447      PT LONG TERM GOAL #1   Title I with HEP   Time 2   Period Weeks   Status On-going     PT LONG TERM GOAL #2   Title Patient able to climb stairs with a reciprocal gait with or without UE support without R leg/hip pain.   Time 6   Period Weeks   Status On-going  Nonreciprical stair gait with increased UE support and assist per patient report 02/25/2016     PT LONG TERM  GOAL #3   Title Patient to be able to get on/off commode with 1/10 pain or less in R leg/hip.   Time 6   Period Weeks   Status On-going  Reports 3/10 pain with standing after sitting on toilet per patient report 02/27/2016     PT LONG TERM GOAL #4   Title Patient to be able to stand for ADLs with 1/10 or less pain in R leg/hip.   Time 6   Period Weeks   Status On-going  Pain in back and hips after sitting for prolonged time 02/25/2016     PT LONG TERM GOAL #5   Title Patient to be able to ambulate community distances without pain in R hip/leg.   Time 6   Period Weeks   Status On-going  Only required walking, no recreational walking per patient. Sacrum and hip discomfort per patient report 02/27/2016               Plan - 03/11/16 1742    Clinical Impression Statement Patient presented in clinic with very minimal R hip adductor pain and overall improvement in regards to R hip adductor condition. Patient now has pain only at end range hip ER at adductor attachment. Tightness noted predominately in R posterior hip/ glute musculature  especially along the R medial glute border. Patient denied any modalities today as she thought manual therapy was enough.   Rehab Potential Excellent   PT Frequency 2x / week   PT Duration 6 weeks   PT Treatment/Interventions ADLs/Self Care Home Management;Cryotherapy;Electrical Stimulation;Moist Heat;Ultrasound;Gait training;Stair training;Patient/family education;Neuromuscular re-education;Therapeutic exercise;Manual techniques;Passive range of motion;Taping;Dry needling   PT Next Visit Plan Continue with manual therapy and PROM of R hip into abduction with modalities PRN per MPT POC.   Consulted and Agree with Plan of Care Patient      Patient will benefit from skilled therapeutic intervention in order to improve the following deficits and impairments:  Abnormal gait, Decreased range of motion, Pain, Decreased activity tolerance, Postural dysfunction, Decreased strength  Visit Diagnosis: Pain in right hip  Pain in right thigh     Problem List Patient Active Problem List   Diagnosis Date Noted  . Contusion, knee and lower leg, left, initial encounter 02/05/2016  . Contusion, forearm and elbow, right, initial encounter 02/05/2016  . Groin strain 02/05/2016  . Upper back strain 02/05/2016  . Hyperlipidemia 10/24/2015  . Benign essential HTN 10/24/2015  . Psoriasis 10/24/2015  . Spinal stenosis 10/24/2015    Evelene CroonKelsey M Parsons, PTA 03/11/2016, 5:50 PM  [facility] [address] [place], [place], [address] Phone: [phone]   Fax:  [phone]  Name: Virginia KittenMargaret Supple MRN: [id number] Date of Birth: 09-10-1955

## 2016-03-12 ENCOUNTER — Ambulatory Visit (INDEPENDENT_AMBULATORY_CARE_PROVIDER_SITE_OTHER): Payer: Worker's Compensation | Admitting: Family Medicine

## 2016-03-12 VITALS — BP 127/59 | HR 70 | Temp 97.2°F | Ht 64.0 in | Wt 229.0 lb

## 2016-03-12 DIAGNOSIS — S5011XA Contusion of right forearm, initial encounter: Secondary | ICD-10-CM

## 2016-03-12 DIAGNOSIS — S29012D Strain of muscle and tendon of back wall of thorax, subsequent encounter: Secondary | ICD-10-CM

## 2016-03-12 DIAGNOSIS — S5011XD Contusion of right forearm, subsequent encounter: Secondary | ICD-10-CM | POA: Diagnosis not present

## 2016-03-12 NOTE — Progress Notes (Signed)
Subjective:  Patient ID: Virginia Marks Pippen, female    DOB: 01-Dec-1955  Age: 60 y.o. MRN: [id number]  CC: pulled muscles and contusions  recheck [facility] 01-28-[facility])   HPI Virginia Marks Flinchum presents for recheck of fall over her rolling bag and tripped in the parking lot at the local [employer]. She fell on her left knee and right forearm and wrenched her back. She has a history of spinal fusion L3-S1 done in the recent past. Earlier this year she had a cervical fusion. Her pain is just right of the spine in theT9 and T10 region. Added it to PT order last week. Some relief. However, the order was not communicated apparently.Also continues RUE pain.  History [name] has a past medical history of Hyperlipidemia; Hypertension; Psoriasis; and Spinal stenosis.   She has a past surgical history that includes Breast excisional biopsy (Right, 2001) and Breast excisional biopsy (Right, 2002).   Her family history includes Breast cancer (age of onset: 2060) in her sister; Breast cancer (age of onset: 7970) in her maternal aunt, maternal aunt, and maternal aunt; Breast cancer (age of onset: 2480) in her maternal aunt; Hypertension in her mother.She reports that she has never smoked. She has never used smokeless tobacco. She reports that she does not drink alcohol. Her drug history is not on file.  Current Outpatient Prescriptions on File Prior to Visit  Medication Sig Dispense Refill  . aspirin 325 MG tablet Take 325 mg by mouth daily.    . clobetasol (TEMOVATE) 0.05 % external solution Apply 1 application topically 2 (two) times daily.    . diclofenac (VOLTAREN) 75 MG EC tablet Take 1 tablet (75 mg total) by mouth 2 (two) times daily. For muscle and  Joint pain 60 tablet 2  . gabapentin (NEURONTIN) 300 MG capsule Take 300 mg by mouth 2 (two) times daily.     . hydrOXYzine (ATARAX/VISTARIL) 25 MG tablet Take 25 mg by mouth daily. 1-2 tABLETS @BEDTIME     . potassium chloride SA  (K-DUR,KLOR-CON) 20 MEQ tablet TAKE 1 TAB DAILY ON DAYS TAKING LASIX 90 tablet 3  . sertraline (ZOLOFT) 100 MG tablet Take 100 mg by mouth daily.    . traMADol (ULTRAM) 50 MG tablet Take by mouth every 6 (six) hours as needed.    . furosemide (LASIX) 20 MG tablet Take 1 tablet (20 mg total) by mouth daily as needed. 90 tablet 3  . metoprolol tartrate (LOPRESSOR) 25 MG tablet Take 0.5 tablets (12.5 mg total) by mouth 2 (two) times daily. 45 tablet 3   No current facility-administered medications on file prior to visit.     ROS Review of Systems  Constitutional: Negative for activity change, appetite change and fever.  HENT: Negative for congestion, rhinorrhea and sore throat.   Eyes: Negative for visual disturbance.  Respiratory: Negative for cough and shortness of breath.   Cardiovascular: Negative for chest pain and palpitations.  Gastrointestinal: Negative for abdominal pain, diarrhea and nausea.  Genitourinary: Negative for dysuria.  Musculoskeletal: Positive for arthralgias and myalgias. Negative for joint swelling.    Objective:  BP (!) 127/59   Pulse 70   Temp 97.2 F (36.2 C) (Oral)   Ht 5\' 4"  (1.626 m)   Wt 229 lb (103.9 kg)   BMI 39.31 kg/m   Physical Exam  Constitutional: She is oriented to person, place, and time. She appears well-developed and well-nourished. She appears distressed.  HENT:  Head: Normocephalic and atraumatic.  Cardiovascular: Normal rate  and regular rhythm.   No murmur heard. Pulmonary/Chest: Effort normal and breath sounds normal.  Abdominal: Soft. There is no tenderness.  Musculoskeletal: She exhibits tenderness (persists at right forearm).  Neurological: She is alert and oriented to person, place, and time.  Skin: Skin is warm and dry.  Psychiatric: She has a normal mood and affect.    Assessment & Plan:   [name] was seen today for pulled muscles and contusions  recheck.  Diagnoses and all orders for this visit:  Contusion, forearm  and elbow, right, initial encounter  Upper back strain, subsequent encounter   I am having Ms. Delpozo maintain her clobetasol, hydrOXYzine, gabapentin, sertraline, furosemide, potassium chloride SA, metoprolol tartrate, aspirin, diclofenac, and traMADol.  Continue therapy, add right forearm tx  Follow-up: Return in about 2 weeks (around 03/26/2016).  Mechele ClaudeWarren Simona Rocque, M.D.

## 2016-03-13 ENCOUNTER — Encounter: Payer: Self-pay | Admitting: Physical Therapy

## 2016-03-13 ENCOUNTER — Ambulatory Visit: Payer: Worker's Compensation | Attending: Family Medicine | Admitting: Physical Therapy

## 2016-03-13 DIAGNOSIS — M79651 Pain in right thigh: Secondary | ICD-10-CM

## 2016-03-13 DIAGNOSIS — M25551 Pain in right hip: Secondary | ICD-10-CM | POA: Diagnosis not present

## 2016-03-13 NOTE — Therapy (Signed)
[facility] [address] [place], [place], [address] Phone: [phone]   Fax:  [phone]  Physical Therapy Treatment  Patient Details  Name: Virginia Marks MRN: [id number] Date of Birth: February 05, 1956 Referring Provider: Mechele ClaudeWarren Stacks  Encounter Date: 03/13/2016      PT End of Session - 03/13/16 1649    Visit Number 9   Number of Visits 12   Date for PT Re-Evaluation 03/28/16   PT Start Time 1649   PT Stop Time 1730   PT Time Calculation (min) 41 min   Activity Tolerance Patient tolerated treatment well   Behavior During Therapy [facility] for tasks assessed/performed      Past Medical History:  Diagnosis Date  . Hyperlipidemia   . Hypertension   . Psoriasis    SCALP  . Spinal stenosis     Past Surgical History:  Procedure Laterality Date  . BREAST EXCISIONAL BIOPSY Right 2001   negative  . BREAST EXCISIONAL BIOPSY Right 2002   negative    There were no vitals filed for this visit.      Subjective Assessment - 03/13/16 1647    Subjective Reports that her R leg is doing well but mid back is still bothering her. Reports that she feels better around sacrum and has tightness in L QL. Patient states that the L QL tightness was causing hip pain during ambulation.   Pertinent History HTN, OP, spinal stenosis, lumbar fusion 2016; neck fusion 6/17; L3-S1 numerous surgeries previously   How long can you stand comfortably? 20 min   Patient Stated Goals to get rid of pain   Currently in Pain? Other (Comment)  Reported L QL tightness with no rating that caused hip pain during ambulation throughout the day            [facility] - 03/13/16 0001      Assessment   Medical Diagnosis R hip pain; R hip strain   Onset Date/Surgical Date 02/04/16   Next MD Visit 03/25/2016     Precautions   Precautions None                     [facility] Adult PT Treatment/Exercise - 03/13/16 0001      Modalities   Modalities  Moist Heat     Moist Heat Therapy   Number Minutes Moist Heat 8 Minutes   Moist Heat Location Hip     Manual Therapy   Manual Therapy Myofascial release   Myofascial Release MFR/STW to B posterior hip/glute and QL musculature to decrease tightness and discomfort that was experienced during ambulation in prone over 1 pillows                     PT Long Term Goals - 02/27/16 1447      PT LONG TERM GOAL #1   Title I with HEP   Time 2   Period Weeks   Status On-going     PT LONG TERM GOAL #2   Title Patient able to climb stairs with a reciprocal gait with or without UE support without R leg/hip pain.   Time 6   Period Weeks   Status On-going  Nonreciprical stair gait with increased UE support and assist per patient report 02/25/2016     PT LONG TERM GOAL #3   Title Patient to be able to get on/off commode with 1/10 pain or less in R leg/hip.   Time 6   Period  Weeks   Status On-going  Reports 3/10 pain with standing after sitting on toilet per patient report 02/27/2016     PT LONG TERM GOAL #4   Title Patient to be able to stand for ADLs with 1/10 or less pain in R leg/hip.   Time 6   Period Weeks   Status On-going  Pain in back and hips after sitting for prolonged time 02/25/2016     PT LONG TERM GOAL #5   Title Patient to be able to ambulate community distances without pain in R hip/leg.   Time 6   Period Weeks   Status On-going  Only required walking, no recreational walking per patient. Sacrum and hip discomfort per patient report 02/27/2016               Plan - 03/13/16 1757    Clinical Impression Statement Patient presented in clinic today with reports of L QL tightness that was causing her hip pain during ambulation. Tightness continues to be present in B glute/posterior hip musculature especially in the R musculature. Tightness palpated in L QL as well to which patient was attributing to hip pain and discomfort during ambulation. Moist heat  applied to posterior hip/glute to assist with pain relief per patient request.   Rehab Potential Excellent   PT Frequency 2x / week   PT Duration 6 weeks   PT Treatment/Interventions ADLs/Self Care Home Management;Cryotherapy;Electrical Stimulation;Moist Heat;Ultrasound;Gait training;Stair training;Patient/family education;Neuromuscular re-education;Therapeutic exercise;Manual techniques;Passive range of motion;Taping;Dry needling   PT Next Visit Plan Continue with manual therapy and PROM of R hip into abduction with modalities PRN per MPT POC.   Consulted and Agree with Plan of Care Patient      Patient will benefit from skilled therapeutic intervention in order to improve the following deficits and impairments:  Abnormal gait, Decreased range of motion, Pain, Decreased activity tolerance, Postural dysfunction, Decreased strength  Visit Diagnosis: Pain in right hip  Pain in right thigh     Problem List Patient Active Problem List   Diagnosis Date Noted  . Contusion, knee and lower leg, left, initial encounter 02/05/2016  . Contusion, forearm and elbow, right, initial encounter 02/05/2016  . Groin strain 02/05/2016  . Upper back strain 02/05/2016  . Hyperlipidemia 10/24/2015  . Benign essential HTN 10/24/2015  . Psoriasis 10/24/2015  . Spinal stenosis 10/24/2015    Evelene CroonKelsey M Parsons, PTA 03/13/2016, 6:01 PM  [facility] [address] [place], [place], [address] Phone: [phone]   Fax:  [phone]  Name: Virginia Marks MRN: [id number] Date of Birth: 1955-09-29

## 2016-03-15 ENCOUNTER — Encounter: Payer: Self-pay | Admitting: Family Medicine

## 2016-03-17 ENCOUNTER — Ambulatory Visit: Payer: Worker's Compensation | Admitting: Physical Therapy

## 2016-03-17 DIAGNOSIS — M25551 Pain in right hip: Secondary | ICD-10-CM

## 2016-03-17 DIAGNOSIS — M546 Pain in thoracic spine: Secondary | ICD-10-CM

## 2016-03-17 DIAGNOSIS — M79631 Pain in right forearm: Secondary | ICD-10-CM

## 2016-03-17 DIAGNOSIS — M79651 Pain in right thigh: Secondary | ICD-10-CM

## 2016-03-17 NOTE — Therapy (Signed)
[facility] [address] [place], [place], [address] Phone: [phone]   Fax:  [phone]  Physical Therapy Treatment  Patient Details  Name: Virginia Marks MRN: [id number] Date of Birth: 1955-06-17 Referring Provider: Mechele ClaudeWarren Stacks  Encounter Date: 03/17/2016      PT End of Session - 03/17/16 1808    Visit Number 10   Number of Visits 24   Date for PT Re-Evaluation 04/25/16   PT Start Time 0500   PT Stop Time 0600   PT Time Calculation (min) 60 min   Behavior During Therapy [facility] for tasks assessed/performed      Past Medical History:  Diagnosis Date  . Hyperlipidemia   . Hypertension   . Psoriasis    SCALP  . Spinal stenosis     Past Surgical History:  Procedure Laterality Date  . BREAST EXCISIONAL BIOPSY Right 2001   negative  . BREAST EXCISIONAL BIOPSY Right 2002   negative    There were no vitals filed for this visit.      Subjective Assessment - 03/17/16 1827    Subjective The patient continues to have problems related to her fall.  She presents to the clinic today with c/o thoracic pain and right forearm pain.  The patient reports the other night she was turning her right forearm over and over and it was "clicking" very loudly.  When the clicking stopped her pain went down.  Shre c/o pain in her mid-back region which will be '"sharp" pain with certain movements.  She reports her adductor is much better.                         [facility] Adult PT Treatment/Exercise - 03/17/16 0001      Modalities   Modalities Electrical Stimulation;Moist Heat     Moist Heat Therapy   Number Minutes Moist Heat 20 Minutes   Moist Heat Location --  Mid-back and right forearm(extensor side).     Programme researcher, broadcasting/film/videolectrical Stimulation   Electrical Stimulation Location --  Mid-back and right forearm.   Electrical Stimulation Action IFC at 100% scan x 20 minutes and pre-mod to right forearm.   Electrical Stimulation Goals Pain                      PT Long Term Goals - 03/17/16 1822      PT LONG TERM GOAL #1   Title I with HEP   Time 2   Period Weeks   Status On-going     PT LONG TERM GOAL #2   Title Patient able to climb stairs with a reciprocal gait with or without UE support without R leg/hip pain.   Time 6   Period Weeks   Status On-going     PT LONG TERM GOAL #3   Title Patient to be able to get on/off commode with 1/10 pain or less in R leg/hip.   Time 6   Period Weeks   Status On-going     PT LONG TERM GOAL #4   Title Patient to be able to stand for ADLs with 1/10 or less pain in R leg/hip.   Time 6   Period Weeks   Status On-going     PT LONG TERM GOAL #5   Title Patient to be able to ambulate community distances without pain in R hip/leg.   Time 6   Period Weeks   Status On-going     PT  LONG TERM GOAL #6   Title Perform ADL's with thoracic and right forearm pain not > 2/10.   Time 6   Period Weeks   Status New               Plan - 03/17/16 1817    Clinical Impression Statement The patient has palpable pain from T2-T8 with increased pain complaints on right.  Her forearm is palpably tender over the extensor musculature (mid-portion of forearm).  She had a negative Tennis elbow test.  Bilateral UE DTR's are normal.   Rehab Potential Excellent   PT Frequency 2x / week   PT Duration 6 weeks   PT Treatment/Interventions ADLs/Self Care Home Management;Cryotherapy;Electrical Stimulation;Moist Heat;Ultrasound;Gait training;Stair training;Patient/family education;Therapeutic exercise;Manual techniques;Passive range of motion;Taping;Dry needling;Therapeutic activities   PT Next Visit Plan HMP and e'stim; U/S; STW/M and thoracic strengthening.  Patient may need thoracic and costovertebral mobs.   Consulted and Agree with Plan of Care Patient      Patient will benefit from skilled therapeutic intervention in order to improve the following deficits and impairments:   Pain, Decreased activity tolerance  Visit Diagnosis: Pain in right hip - Plan: PT plan of care cert/re-cert  Pain in right thigh - Plan: PT plan of care cert/re-cert  Pain in thoracic spine - Plan: PT plan of care cert/re-cert  Pain in right forearm - Plan: PT plan of care cert/re-cert     Problem List Patient Active Problem List   Diagnosis Date Noted  . Contusion, knee and lower leg, left, initial encounter 02/05/2016  . Contusion, forearm and elbow, right, initial encounter 02/05/2016  . Groin strain 02/05/2016  . Upper back strain 02/05/2016  . Hyperlipidemia 10/24/2015  . Benign essential HTN 10/24/2015  . Psoriasis 10/24/2015  . Spinal stenosis 10/24/2015    Sparrow Sanzo, ItalyHAD MPT 03/17/2016, 6:29 PM  [facility] [address] [place], [place], [address] Phone: [phone]   Fax:  [phone]  Name: Virginia Marks MRN: [id number] Date of Birth: [birth year]

## 2016-03-18 ENCOUNTER — Ambulatory Visit: Payer: Worker's Compensation | Admitting: Physical Therapy

## 2016-03-20 ENCOUNTER — Encounter: Payer: Self-pay | Admitting: Physical Therapy

## 2016-03-20 ENCOUNTER — Ambulatory Visit: Payer: Worker's Compensation | Admitting: Physical Therapy

## 2016-03-20 DIAGNOSIS — M79651 Pain in right thigh: Secondary | ICD-10-CM

## 2016-03-20 DIAGNOSIS — M25551 Pain in right hip: Secondary | ICD-10-CM

## 2016-03-20 DIAGNOSIS — M546 Pain in thoracic spine: Secondary | ICD-10-CM

## 2016-03-20 DIAGNOSIS — M79631 Pain in right forearm: Secondary | ICD-10-CM

## 2016-03-20 NOTE — Therapy (Signed)
[facility] [address] [place], [place], [address] Phone: [phone]   Fax:  [phone]  Physical Therapy Treatment  Patient Details  Name: Virginia Marks MRN: [id number] Date of Birth: 04-24-1955 Referring Provider: Mechele ClaudeWarren Stacks  Encounter Date: 03/20/2016      PT End of Session - 03/20/16 1607    Visit Number 11   Number of Visits 24   Date for PT Re-Evaluation 04/25/16   PT Start Time 1615   PT Stop Time 1703   PT Time Calculation (min) 48 min   Activity Tolerance Patient tolerated treatment well   Behavior During Therapy [facility] for tasks assessed/performed      Past Medical History:  Diagnosis Date  . Hyperlipidemia   . Hypertension   . Psoriasis    SCALP  . Spinal stenosis     Past Surgical History:  Procedure Laterality Date  . BREAST EXCISIONAL BIOPSY Right 2001   negative  . BREAST EXCISIONAL BIOPSY Right 2002   negative    There were no vitals filed for this visit.      Subjective Assessment - 03/20/16 1606    Subjective Reports that mid back is tight as usual but that R forearm hasn't been hurting since the clicking incident. Reports some soreness in R forearm. Reports difficulty with stairs today and hip weakness reported by patient.   Pertinent History HTN, OP, spinal stenosis, lumbar fusion 2016; neck fusion 6/17; L3-S1 numerous surgeries previously   How long can you stand comfortably? 20 min   Patient Stated Goals to get rid of pain   Currently in Pain? Yes   Pain Score 3    Pain Location Thoracic   Pain Orientation Right;Left;Mid   Pain Descriptors / Indicators Tightness   Pain Type Acute pain   Pain Onset More than a month ago            [facility] Assessment - 03/20/16 0001      Assessment   Medical Diagnosis R hip pain; R hip strain   Onset Date/Surgical Date 02/04/16   Next MD Visit 03/25/2016     Precautions   Precautions None                     [facility] Adult PT  Treatment/Exercise - 03/20/16 0001      Modalities   Modalities Electrical Stimulation;Moist Heat;Ultrasound     Moist Heat Therapy   Number Minutes Moist Heat 15 Minutes   Moist Heat Location Other (comment)  Thoracic spine and forearm     Electrical Stimulation   Electrical Stimulation Location B thoracic paraspinals   Electrical Stimulation Action IFC   Electrical Stimulation Parameters 1-10 hz x15 min   Electrical Stimulation Goals Pain     Ultrasound   Ultrasound Location B thoracic paraspinals   Ultrasound Parameters 1.0 w/cm2, 100%, 1 mhz x10 min   Ultrasound Goals Pain     Manual Therapy   Manual Therapy Soft tissue mobilization   Soft tissue mobilization STW to B thoracic paraspinals to decrease pain and tightness in prone                     PT Long Term Goals - 03/17/16 1822      PT LONG TERM GOAL #1   Title I with HEP   Time 2   Period Weeks   Status On-going     PT LONG TERM GOAL #2   Title Patient able to  climb stairs with a reciprocal gait with or without UE support without R leg/hip pain.   Time 6   Period Weeks   Status On-going     PT LONG TERM GOAL #3   Title Patient to be able to get on/off commode with 1/10 pain or less in R leg/hip.   Time 6   Period Weeks   Status On-going     PT LONG TERM GOAL #4   Title Patient to be able to stand for ADLs with 1/10 or less pain in R leg/hip.   Time 6   Period Weeks   Status On-going     PT LONG TERM GOAL #5   Title Patient to be able to ambulate community distances without pain in R hip/leg.   Time 6   Period Weeks   Status On-going     PT LONG TERM GOAL #6   Title Perform ADL's with thoracic and right forearm pain not > 2/10.   Time 6   Period Weeks   Status New               Plan - 03/20/16 1708    Clinical Impression Statement Patient arrived to treatment with continued reports of tightness and discomfort in thoracic musculature with most pain running through R  thoracic musculature per patient report. Patient had only complaints of R forearm soreness today in clinic and moist heat was applied to forearm during treatment. Minimal tightness noted in B thoracic paraspinals during manual therapy. Normal modalities response noted following removal of the modalities.    Rehab Potential Excellent   PT Frequency 2x / week   PT Duration 6 weeks   PT Treatment/Interventions ADLs/Self Care Home Management;Cryotherapy;Electrical Stimulation;Moist Heat;Ultrasound;Gait training;Stair training;Patient/family education;Therapeutic exercise;Manual techniques;Passive range of motion;Taping;Dry needling;Therapeutic activities   PT Next Visit Plan Continue with manual therapy and modalities to thoracic musculature and R forearm as symptoms dictate per MPT POC.   Consulted and Agree with Plan of Care Patient      Patient will benefit from skilled therapeutic intervention in order to improve the following deficits and impairments:  Pain, Decreased activity tolerance  Visit Diagnosis: Pain in right hip  Pain in right thigh  Pain in thoracic spine  Pain in right forearm     Problem List Patient Active Problem List   Diagnosis Date Noted  . Contusion, knee and lower leg, left, initial encounter 02/05/2016  . Contusion, forearm and elbow, right, initial encounter 02/05/2016  . Groin strain 02/05/2016  . Upper back strain 02/05/2016  . Hyperlipidemia 10/24/2015  . Benign essential HTN 10/24/2015  . Psoriasis 10/24/2015  . Spinal stenosis 10/24/2015    Evelene CroonKelsey M Parsons, PTA 03/20/2016, 5:25 PM  [facility] [address] [place], [place], [address] Phone: [phone]   Fax:  [phone]  Name: Virginia Marks MRN: [id number] Date of Birth: 1955/08/25

## 2016-03-24 ENCOUNTER — Ambulatory Visit: Payer: Worker's Compensation | Admitting: Physical Therapy

## 2016-03-24 ENCOUNTER — Encounter: Payer: Self-pay | Admitting: Physical Therapy

## 2016-03-24 DIAGNOSIS — M79651 Pain in right thigh: Secondary | ICD-10-CM

## 2016-03-24 DIAGNOSIS — M25551 Pain in right hip: Secondary | ICD-10-CM | POA: Diagnosis not present

## 2016-03-24 DIAGNOSIS — M79631 Pain in right forearm: Secondary | ICD-10-CM

## 2016-03-24 DIAGNOSIS — M546 Pain in thoracic spine: Secondary | ICD-10-CM

## 2016-03-24 NOTE — Therapy (Signed)
[facility] [address] [place], [place], [address] Phone: [phone]   Fax:  [phone]  Physical Therapy Treatment  Patient Details  Name: Virginia Marks MRN: [id number] Date of Birth: 10-Aug-1955 Referring Provider: Mechele ClaudeWarren Stacks  Encounter Date: 03/24/2016      PT End of Session - 03/24/16 1612    Visit Number 12   Number of Visits 24   Date for PT Re-Evaluation 04/25/16   PT Start Time 1614   PT Stop Time 1702   PT Time Calculation (min) 48 min   Activity Tolerance Patient tolerated treatment well   Behavior During Therapy [facility] for tasks assessed/performed      Past Medical History:  Diagnosis Date  . Hyperlipidemia   . Hypertension   . Psoriasis    SCALP  . Spinal stenosis     Past Surgical History:  Procedure Laterality Date  . BREAST EXCISIONAL BIOPSY Right 2001   negative  . BREAST EXCISIONAL BIOPSY Right 2002   negative    There were no vitals filed for this visit.      Subjective Assessment - 03/24/16 1609    Subjective Reports clicking has returned in R forearm and she has had no groin pain. Reports that low back feels better than it has and upper back still feels tight.   Pertinent History HTN, OP, spinal stenosis, lumbar fusion 2016; neck fusion 6/17; L3-S1 numerous surgeries previously   How long can you stand comfortably? 20 min   Patient Stated Goals to get rid of pain   Currently in Pain? Yes   Pain Score 2    Pain Location Arm   Pain Orientation Right;Lower   Pain Descriptors / Indicators Other (Comment)  "clicking"   Pain Type Acute pain   Pain Onset More than a month ago   Pain Frequency Constant  "worse in morning 2-3/10"   Pain Score 1   Pain Location Back   Pain Orientation Upper   Pain Descriptors / Indicators Tightness   Pain Type Acute pain   Pain Onset More than a month ago            [facility] PT Assessment - 03/24/16 0001      Assessment   Medical Diagnosis R hip  pain; R hip strain   Onset Date/Surgical Date 02/04/16   Next MD Visit 03/25/2016     Precautions   Precautions None                     [facility] Adult PT Treatment/Exercise - 03/24/16 0001      Modalities   Modalities Electrical Stimulation;Moist Heat;Ultrasound     Moist Heat Therapy   Number Minutes Moist Heat 15 Minutes   Moist Heat Location Other (comment)  Thoracic spine and R forearm in supine     Electrical Stimulation   Electrical Stimulation Location B thoracic paraspinals, R wrist extensors  in supine   Electrical Stimulation Action 2 channels: Pre-Mod   Electrical Stimulation Parameters 80-150 hz x15 min   Electrical Stimulation Goals Pain     Ultrasound   Ultrasound Location B thoracic paraspinals  in prone   Ultrasound Parameters 1.2 w/cm2, 100%, 1 mhz x10 min   Ultrasound Goals Pain     Manual Therapy   Manual Therapy Soft tissue mobilization   Soft tissue mobilization STW to B thoracic paraspinals, scapular retractors to decrease pain and tightness in prone  PT Long Term Goals - 03/17/16 1822      PT LONG TERM GOAL #1   Title I with HEP   Time 2   Period Weeks   Status On-going     PT LONG TERM GOAL #2   Title Patient able to climb stairs with a reciprocal gait with or without UE support without R leg/hip pain.   Time 6   Period Weeks   Status On-going     PT LONG TERM GOAL #3   Title Patient to be able to get on/off commode with 1/10 pain or less in R leg/hip.   Time 6   Period Weeks   Status On-going     PT LONG TERM GOAL #4   Title Patient to be able to stand for ADLs with 1/10 or less pain in R leg/hip.   Time 6   Period Weeks   Status On-going     PT LONG TERM GOAL #5   Title Patient to be able to ambulate community distances without pain in R hip/leg.   Time 6   Period Weeks   Status On-going     PT LONG TERM GOAL #6   Title Perform ADL's with thoracic and right forearm pain not >  2/10.   Time 6   Period Weeks   Status New               Plan - 03/24/16 1701    Clinical Impression Statement Patient arrived to treatment today with reports of returned R forarm discomfort and tightness of the thoracic musculature. Upon observation of patient's thoracic musculature patient presented with greater R thoracic paraspinal/ scapular retractor tightness and inflammation than the L musclature although minimal inflammaiton noted upon comparison. Patient experiences increased R forearm pain with pushing up with R forearm to R sidelying to sit in long rolling technique per patient report and demonstration. Normal modalities response noted following removal of the modalities.    Rehab Potential Excellent   PT Frequency 2x / week   PT Duration 6 weeks   PT Treatment/Interventions ADLs/Self Care Home Management;Cryotherapy;Electrical Stimulation;Moist Heat;Ultrasound;Gait training;Stair training;Patient/family education;Therapeutic exercise;Manual techniques;Passive range of motion;Taping;Dry needling;Therapeutic activities   PT Next Visit Plan Continue with manual therapy and modalities to thoracic musculature and R forearm as symptoms dictate per MPT POC.   Consulted and Agree with Plan of Care Patient      Patient will benefit from skilled therapeutic intervention in order to improve the following deficits and impairments:  Pain, Decreased activity tolerance  Visit Diagnosis: Pain in right hip  Pain in right thigh  Pain in thoracic spine  Pain in right forearm     Problem List Patient Active Problem List   Diagnosis Date Noted  . Contusion, knee and lower leg, left, initial encounter 02/05/2016  . Contusion, forearm and elbow, right, initial encounter 02/05/2016  . Groin strain 02/05/2016  . Upper back strain 02/05/2016  . Hyperlipidemia 10/24/2015  . Benign essential HTN 10/24/2015  . Psoriasis 10/24/2015  . Spinal stenosis 10/24/2015    Evelene CroonKelsey M Parsons,  PTA 03/24/2016, 5:11 PM  [facility] [address] [place], [place], [address] Phone: [phone]   Fax:  [phone]  Name: Virginia Marks MRN: [id number] Date of Birth: Oct 26, 1955

## 2016-03-25 ENCOUNTER — Ambulatory Visit (INDEPENDENT_AMBULATORY_CARE_PROVIDER_SITE_OTHER): Payer: Worker's Compensation | Admitting: Family Medicine

## 2016-03-25 ENCOUNTER — Other Ambulatory Visit: Payer: Self-pay | Admitting: Family Medicine

## 2016-03-25 VITALS — BP 121/69 | HR 69 | Temp 97.6°F | Ht 64.0 in | Wt 235.4 lb

## 2016-03-25 DIAGNOSIS — S8012XA Contusion of left lower leg, initial encounter: Secondary | ICD-10-CM

## 2016-03-25 DIAGNOSIS — S29012D Strain of muscle and tendon of back wall of thorax, subsequent encounter: Secondary | ICD-10-CM | POA: Diagnosis not present

## 2016-03-25 DIAGNOSIS — S8002XA Contusion of left knee, initial encounter: Secondary | ICD-10-CM

## 2016-03-25 DIAGNOSIS — S76211D Strain of adductor muscle, fascia and tendon of right thigh, subsequent encounter: Secondary | ICD-10-CM | POA: Diagnosis not present

## 2016-03-25 NOTE — Progress Notes (Signed)
   Subjective:  Patient ID: Virginia Marks, female    DOB: [birth year]  Age: 60 y.o. MRN: [id number]  CC: Workers Comp Follow up (pt here today following up and she is still having discomfort especially after sitting for a period of time)   HPI  presents for recheck of fall over her rolling bag and tripped in the parking lot at the local [employer]. She fell on her left knee and right forearm and wrenched her back. She has a history of spinal fusion L3-S1 done in the recent past. Earlier this year she had a cervical fusion. Her pain is just right of the spine in theT9 and T10 region. Added it to PT order last week. Some relief. However, the order was not communicated apparently.Also continues RUE pain.   ROS Review of Systems  Constitutional: Negative for activity change, appetite change and fever.  HENT: Negative for congestion, rhinorrhea and sore throat.   Eyes: Negative for visual disturbance.  Respiratory: Negative for cough and shortness of breath.   Cardiovascular: Negative for chest pain and palpitations.  Gastrointestinal: Negative for abdominal pain, diarrhea and nausea.  Genitourinary: Negative for dysuria.  Musculoskeletal: Positive for arthralgias and myalgias. Negative for joint swelling.    Objective:  BP 121/69   Pulse 69   Temp 97.6 F (36.4 C) (Oral)   Ht 5\' 4"  (1.626 m)   Wt 235 lb 6 oz (106.8 kg)   BMI 40.40 kg/m   Physical Exam  Constitutional: She is oriented to person, place, and time. She appears well-developed and well-nourished. No distress.  HENT:  Head: Normocephalic and atraumatic.  Cardiovascular: Normal rate and regular rhythm.   No murmur heard. Pulmonary/Chest: Effort normal and breath sounds normal.  Abdominal: Soft. There is no tenderness.  Musculoskeletal: She exhibits tenderness (persists at right forearm).  Neurological: She is alert and oriented to person, place, and time.  Skin: Skin is warm and dry.  Psychiatric: She has a normal  mood and affect.    Assessment & Plan:   Virginia Marks was seen today for workers comp follow up.  Diagnoses and all orders for this visit:  Contusion, knee and lower leg, left, initial encounter -     Ambulatory referral to Orthopedics  Groin strain, right, subsequent encounter -     Ambulatory referral to Orthopedics  Upper back strain, subsequent encounter -     Ambulatory referral to Orthopedics   I am having Virginia Marks maintain her clobetasol, hydrOXYzine, gabapentin, sertraline, furosemide, potassium chloride SA, metoprolol tartrate, aspirin, diclofenac, and traMADol.  Continue therapy, add right forearm tx  Follow-up: with ortho.  Mechele ClaudeWarren Angelissa Supan, M.D.

## 2016-03-27 ENCOUNTER — Encounter: Payer: Self-pay | Admitting: Physical Therapy

## 2016-03-27 ENCOUNTER — Ambulatory Visit: Payer: Worker's Compensation | Admitting: Physical Therapy

## 2016-03-27 DIAGNOSIS — M79631 Pain in right forearm: Secondary | ICD-10-CM

## 2016-03-27 DIAGNOSIS — M25551 Pain in right hip: Secondary | ICD-10-CM

## 2016-03-27 DIAGNOSIS — M546 Pain in thoracic spine: Secondary | ICD-10-CM

## 2016-03-27 DIAGNOSIS — M79651 Pain in right thigh: Secondary | ICD-10-CM

## 2016-03-27 NOTE — Therapy (Signed)
[facility] [address] [place], [place], [address] Phone: [phone]   Fax:  [phone]  Physical Therapy Treatment  Patient Details  Name: Virginia Marks MRN: [id number] Date of Birth: 15-Sep-1955 Referring Provider: Mechele ClaudeWarren Stacks  Encounter Date: 03/27/2016      PT End of Session - 03/27/16 1515    Visit Number 13   Number of Visits 24   Date for PT Re-Evaluation 04/25/16   PT Start Time 1440   PT Stop Time 1524   PT Time Calculation (min) 44 min   Activity Tolerance Patient tolerated treatment well   Behavior During Therapy [facility] for tasks assessed/performed      Past Medical History:  Diagnosis Date  . Hyperlipidemia   . Hypertension   . Psoriasis    SCALP  . Spinal stenosis     Past Surgical History:  Procedure Laterality Date  . BREAST EXCISIONAL BIOPSY Right 2001   negative  . BREAST EXCISIONAL BIOPSY Right 2002   negative    There were no vitals filed for this visit.      Subjective Assessment - 03/27/16 1516    Subjective Reports that her upper back is still tight and tightness has returned to her hip region. Reports that forearm has felt okay today and reports that MD is to send her to an orthopedic MD.   Pertinent History HTN, OP, spinal stenosis, lumbar fusion 2016; neck fusion 6/17; L3-S1 numerous surgeries previously   How long can you stand comfortably? 20 min   Patient Stated Goals to get rid of pain   Currently in Pain? Other (Comment)  No rating provided            [facility] - 03/27/16 0001      Assessment   Medical Diagnosis R hip pain; R hip strain   Onset Date/Surgical Date 02/04/16   Next MD Visit 03/25/2016     Precautions   Precautions None                     [facility] Adult PT Treatment/Exercise - 03/27/16 0001      Modalities   Modalities Electrical Stimulation;Moist Heat;Ultrasound     Moist Heat Therapy   Number Minutes Moist Heat 15 Minutes    Moist Heat Location Hip;Other (comment)  thoracic spine, and R forearm     Electrical Stimulation   Electrical Stimulation Location B thoracic paraspinals, B glute   Electrical Stimulation Action 2 channels: Pre-Mod   Electrical Stimulation Parameters 80-150 hz x15 min   Electrical Stimulation Goals Pain     Ultrasound   Ultrasound Location B thoracic paraspinals   Ultrasound Parameters 1.5 w/cm2, 100%, 1 mhz x10 min   Ultrasound Goals Pain     Manual Therapy   Manual Therapy Soft tissue mobilization   Soft tissue mobilization STW to B thoracic paraspinals, scapular retractors, R glute to decrease pain and tightness in prone                     PT Long Term Goals - 03/17/16 1822      PT LONG TERM GOAL #1   Title I with HEP   Time 2   Period Weeks   Status On-going     PT LONG TERM GOAL #2   Title Patient able to climb stairs with a reciprocal gait with or without UE support without R leg/hip pain.   Time 6   Period Weeks  Status On-going     PT LONG TERM GOAL #3   Title Patient to be able to get on/off commode with 1/10 pain or less in R leg/hip.   Time 6   Period Weeks   Status On-going     PT LONG TERM GOAL #4   Title Patient to be able to stand for ADLs with 1/10 or less pain in R leg/hip.   Time 6   Period Weeks   Status On-going     PT LONG TERM GOAL #5   Title Patient to be able to ambulate community distances without pain in R hip/leg.   Time 6   Period Weeks   Status On-going     PT LONG TERM GOAL #6   Title Perform ADL's with thoracic and right forearm pain not > 2/10.   Time 6   Period Weeks   Status New               Plan - 03/27/16 1519    Clinical Impression Statement Patient arrived to treatment today with reports of tightness in both upper back and glutes. Inflammation continues to be noted in R upper rhomboid region as medial as along R medial border of the scapula. Minimal tightness noted in R periscapular musclature  today. Palpation was completed of B glutes with patient especially sensitive to palpation of the L glute. Tightness noted in B glute musculature. Normal modalities response noted following removal of the modalities.    Rehab Potential Excellent   PT Frequency 2x / week   PT Duration 6 weeks   PT Treatment/Interventions ADLs/Self Care Home Management;Cryotherapy;Electrical Stimulation;Moist Heat;Ultrasound;Gait training;Stair training;Patient/family education;Therapeutic exercise;Manual techniques;Passive range of motion;Taping;Dry needling;Therapeutic activities   PT Next Visit Plan Continue with manual therapy and modalities to thoracic musculature and R forearm as symptoms dictate per MPT POC.   Consulted and Agree with Plan of Care Patient      Patient will benefit from skilled therapeutic intervention in order to improve the following deficits and impairments:  Pain, Decreased activity tolerance  Visit Diagnosis: Pain in right hip  Pain in right thigh  Pain in thoracic spine  Pain in right forearm     Problem List Patient Active Problem List   Diagnosis Date Noted  . Contusion, knee and lower leg, left, initial encounter 02/05/2016  . Contusion, forearm and elbow, right, initial encounter 02/05/2016  . Groin strain 02/05/2016  . Upper back strain 02/05/2016  . Hyperlipidemia 10/24/2015  . Benign essential HTN 10/24/2015  . Psoriasis 10/24/2015  . Spinal stenosis 10/24/2015    Evelene CroonKelsey M Parsons, PTA 03/27/2016, 3:30 PM  [facility] [address] [place], [place], [address] Phone: [phone]   Fax:  [phone]  Name: Virginia Marks MRN: [id number] Date of Birth: [birth year]

## 2016-04-02 ENCOUNTER — Ambulatory Visit: Payer: Worker's Compensation | Admitting: Physical Therapy

## 2016-04-02 ENCOUNTER — Encounter: Payer: Self-pay | Admitting: Physical Therapy

## 2016-04-02 ENCOUNTER — Telehealth: Payer: Self-pay

## 2016-04-02 DIAGNOSIS — M79631 Pain in right forearm: Secondary | ICD-10-CM

## 2016-04-02 DIAGNOSIS — M546 Pain in thoracic spine: Secondary | ICD-10-CM

## 2016-04-02 DIAGNOSIS — M79651 Pain in right thigh: Secondary | ICD-10-CM

## 2016-04-02 DIAGNOSIS — M25551 Pain in right hip: Secondary | ICD-10-CM

## 2016-04-02 NOTE — Therapy (Signed)
[facility] [address] [place], [place], [address] Phone: [phone]   Fax:  [phone]  Physical Therapy Treatment  Patient Details  Name: Virginia KittenMargaret Attaway MRN: [id number] Date of Birth: 1955-10-29 Referring Provider: Mechele ClaudeWarren Stacks  Encounter Date: 04/02/2016      PT End of Session - 04/02/16 1615    Visit Number 14   Number of Visits 24   Date for PT Re-Evaluation 04/25/16   PT Start Time 1615   PT Stop Time 1704   PT Time Calculation (min) 49 min   Activity Tolerance Patient tolerated treatment well   Behavior During Therapy [facility] for tasks assessed/performed      Past Medical History:  Diagnosis Date  . Hyperlipidemia   . Hypertension   . Psoriasis    SCALP  . Spinal stenosis     Past Surgical History:  Procedure Laterality Date  . BREAST EXCISIONAL BIOPSY Right 2001   negative  . BREAST EXCISIONAL BIOPSY Right 2002   negative    There were no vitals filed for this visit.      Subjective Assessment - 04/02/16 1614    Subjective Reports that her forearm is good and that L hip and upper back is tight. Reports that she has been staying at her sisters and taking care of her horses while she is out of town.   Pertinent History HTN, OP, spinal stenosis, lumbar fusion 2016; neck fusion 6/17; L3-S1 numerous surgeries previously   How long can you stand comfortably? 20 min   Patient Stated Goals to get rid of pain   Currently in Pain? Yes   Pain Score 2    Pain Location Hip   Pain Orientation Left   Pain Descriptors / Indicators Discomfort   Pain Type Acute pain   Pain Onset More than a month ago   Pain Score 4   Pain Location Back   Pain Orientation Upper;Lower   Pain Descriptors / Indicators Tightness   Pain Type Acute pain   Pain Onset More than a month ago   Pain Frequency Constant            [facility] PT Assessment - 04/02/16 0001      Assessment   Medical Diagnosis R hip pain; R hip strain   Onset  Date/Surgical Date 02/04/16   Next MD Visit 03/25/2016     Precautions   Precautions None                     [facility] Adult PT Treatment/Exercise - 04/02/16 0001      Modalities   Modalities Electrical Stimulation;Moist Heat;Ultrasound     Moist Heat Therapy   Number Minutes Moist Heat 15 Minutes   Moist Heat Location Hip;Other (comment)  thoracic spine     Electrical Stimulation   Electrical Stimulation Location B thoracic paraspinals, B glute   Electrical Stimulation Action 2 channels: Pre-Mod   Electrical Stimulation Parameters 80-150 hz x15 min   Electrical Stimulation Goals Pain     Ultrasound   Ultrasound Location B thoracic paraspinals   Ultrasound Parameters 1.5 w/cm2, 100%, 1 mhz x10 min   Ultrasound Goals Pain     Manual Therapy   Manual Therapy Soft tissue mobilization   Soft tissue mobilization STW to B thoracic paraspinals, scapular retractors, R glute to decrease pain and tightness in prone                PT Education - 04/02/16 1701  Education provided Yes   Education Details HEP- Piriformis stretch   Person(s) Educated Patient   Methods Explanation;Handout   Comprehension Verbalized understanding             PT Long Term Goals - 03/17/16 1822      PT LONG TERM GOAL #1   Title I with HEP   Time 2   Period Weeks   Status On-going     PT LONG TERM GOAL #2   Title Patient able to climb stairs with a reciprocal gait with or without UE support without R leg/hip pain.   Time 6   Period Weeks   Status On-going     PT LONG TERM GOAL #3   Title Patient to be able to get on/off commode with 1/10 pain or less in R leg/hip.   Time 6   Period Weeks   Status On-going     PT LONG TERM GOAL #4   Title Patient to be able to stand for ADLs with 1/10 or less pain in R leg/hip.   Time 6   Period Weeks   Status On-going     PT LONG TERM GOAL #5   Title Patient to be able to ambulate community distances without pain in R  hip/leg.   Time 6   Period Weeks   Status On-going     PT LONG TERM GOAL #6   Title Perform ADL's with thoracic and right forearm pain not > 2/10.   Time 6   Period Weeks   Status New               Plan - 04/02/16 1701    Clinical Impression Statement Patient arrived to treatment today with no complaints in regards to R forearm. Patient arrived with tightness of B thoracic spine and hips. Slightly greater inflammation noted in R thoracic spine than in L musclature upon observation. B thoracic paraspinals sore to palpation per patient report. Greater tightness noted today in B thoracic paraspinals but especially in upper thoracic paraspinals and upper rhomboids. Tightness palpated along R hip today with patient complaining of L hip pain that was greater. Tightness palpated along piriformis thus piriformis stretch provided to assist with decreasing hip tightness. Normal modalities response noted following removal of the modalities. Patient educated regarding the piriformis stretch and parameters with patient verbalizing understanding and understanding to stop if low back pain presents as she has previous low back surgery.   Rehab Potential Excellent   PT Frequency 2x / week   PT Duration 6 weeks   PT Treatment/Interventions ADLs/Self Care Home Management;Cryotherapy;Electrical Stimulation;Moist Heat;Ultrasound;Gait training;Stair training;Patient/family education;Therapeutic exercise;Manual techniques;Passive range of motion;Taping;Dry needling;Therapeutic activities   PT Next Visit Plan Continue with manual therapy and modalities to thoracic musculature and R forearm as symptoms dictate per MPT POC.   Consulted and Agree with Plan of Care Patient      Patient will benefit from skilled therapeutic intervention in order to improve the following deficits and impairments:  Pain, Decreased activity tolerance  Visit Diagnosis: Pain in right hip  Pain in right thigh  Pain in thoracic  spine  Pain in right forearm     Problem List Patient Active Problem List   Diagnosis Date Noted  . Contusion, knee and lower leg, left, initial encounter 02/05/2016  . Contusion, forearm and elbow, right, initial encounter 02/05/2016  . Groin strain 02/05/2016  . Upper back strain 02/05/2016  . Hyperlipidemia 10/24/2015  . Benign essential HTN 10/24/2015  .  Psoriasis 10/24/2015  . Spinal stenosis 10/24/2015    Evelene Croon, PTA 04/02/2016, 5:10 PM  [facility] [address] [place], [place], [address] Phone: [phone]   Fax:  [phone]  Name: Sheril Hammond MRN: [id number] Date of Birth: 11/17/1955

## 2016-04-02 NOTE — Patient Instructions (Addendum)
Stretching: Piriformis (Supine)    Pull right knee toward opposite shoulder. Hold _30___ seconds. Relax. Repeat __3__ times per set. Do ____ sets per session. Do _2-3___ sessions per day.  http://orth.exer.us/712   Copyright  VHI. All rights reserved.

## 2016-04-04 NOTE — Telephone Encounter (Signed)
x

## 2016-04-08 ENCOUNTER — Ambulatory Visit: Payer: Worker's Compensation | Attending: Family Medicine | Admitting: Physical Therapy

## 2016-04-08 ENCOUNTER — Encounter: Payer: Self-pay | Admitting: Physical Therapy

## 2016-04-08 DIAGNOSIS — M25551 Pain in right hip: Secondary | ICD-10-CM | POA: Diagnosis present

## 2016-04-08 DIAGNOSIS — M546 Pain in thoracic spine: Secondary | ICD-10-CM | POA: Insufficient documentation

## 2016-04-08 DIAGNOSIS — M79651 Pain in right thigh: Secondary | ICD-10-CM | POA: Insufficient documentation

## 2016-04-08 DIAGNOSIS — M79631 Pain in right forearm: Secondary | ICD-10-CM | POA: Insufficient documentation

## 2016-04-08 NOTE — Therapy (Signed)
[facility] [place], [place], [address] Phone: [phone]   Fax:  [phone]  Physical Therapy Treatment  Patient Details  Name: Virginia Marks MRN: [id number] Date of Birth: Nov 11, 1955 Referring Provider: Claretta Fraise  Encounter Date: 04/08/2016      PT End of Session - 04/08/16 1525    Visit Number 15   Number of Visits 24   Date for PT Re-Evaluation 04/25/16   PT Start Time 1526   PT Stop Time 1613   PT Time Calculation (min) 47 min   Activity Tolerance Patient tolerated treatment well   Behavior During Therapy [facility] for tasks assessed/performed      Past Medical History:  Diagnosis Date  . Hyperlipidemia   . Hypertension   . Psoriasis    SCALP  . Spinal stenosis     Past Surgical History:  Procedure Laterality Date  . BREAST EXCISIONAL BIOPSY Right 2001   negative  . BREAST EXCISIONAL BIOPSY Right 2002   negative    There were no vitals filed for this visit.      Subjective Assessment - 04/08/16 1524    Subjective Reports continued mid back tightness as well as low back and intermittant L hip discomfort. Reports that she feels better as she has been able to rest over Christmas break.   Pertinent History HTN, OP, spinal stenosis, lumbar fusion 2016; neck fusion 6/17; L3-S1 numerous surgeries previously   How long can you stand comfortably? 20 min   Patient Stated Goals to get rid of pain            [facility] PT Assessment - 04/08/16 0001      Assessment   Medical Diagnosis R hip pain; R hip strain   Onset Date/Surgical Date 02/04/16   Next MD Visit TBD with orthopedic     Precautions   Precautions None                     [facility] Adult PT Treatment/Exercise - 04/08/16 0001      Modalities   Modalities Electrical Stimulation;Moist Heat;Ultrasound     Moist Heat Therapy   Number Minutes Moist Heat 15 Minutes   Moist Heat Location Hip;Other (comment)  thoracic spine     Electrical Stimulation   Electrical Stimulation Location B thoracic paraspinals, B glute   Electrical Stimulation Action 2 channels: Pre-Mod   Electrical Stimulation Parameters 80-150 hz x15 min   Electrical Stimulation Goals Pain     Ultrasound   Ultrasound Location B thoracic paraspinals   Ultrasound Parameters 1.5 w/cm2, 100%, 1 mhz x10 min   Ultrasound Goals Pain     Manual Therapy   Manual Therapy Soft tissue mobilization   Soft tissue mobilization STW to B thoracic paraspinals, scapular retractors, R glute to decrease pain and tightness in prone                     PT Long Term Goals - 04/08/16 1559      PT LONG TERM GOAL #1   Title I with HEP   Time 2   Period Weeks   Status Achieved     PT LONG TERM GOAL #2   Title Patient able to climb stairs with a reciprocal gait with or without UE support without R leg/hip pain.   Time 6   Period Weeks   Status Partially Met  Nonreciprical at this time but due to L hip/knee per patient report 04/09/2016  PT LONG TERM GOAL #3   Title Patient to be able to get on/off commode with 1/10 pain or less in R leg/hip.   Time 6   Period Weeks   Status Achieved     PT LONG TERM GOAL #4   Title Patient to be able to stand for ADLs with 1/10 or less pain in R leg/hip.   Time 6   Period Weeks   Status Partially Met  Improving as she was able to do dishes for 10 minutes when pain began instead of immediate pain per patient report 04/08/2016     PT LONG TERM GOAL #5   Title Patient to be able to ambulate community distances without pain in R hip/leg.   Time 6   Period Weeks   Status Achieved     PT LONG TERM GOAL #6   Title Perform ADL's with thoracic and right forearm pain not > 2/10.   Time 6   Period Weeks   Status On-going  Pain in thoracic spine after 10 minutes of ADLs instead of immediate pain per patient report 04/08/2016               Plan - 04/08/16 1618    Clinical Impression Statement Patient  arrived to treatment with patient reports of L hip discomfort that was greater than any other complaint today. Patient presented with only minimal tightness in B thoracic musculature and R glute musculature although the greatest complaint was L hip discomfort. All goals were reassessed today and patient making progress towards all goals. Normal modalities response noted following removal of the modalities.   Rehab Potential Excellent   PT Frequency 2x / week   PT Duration 6 weeks   PT Treatment/Interventions ADLs/Self Care Home Management;Cryotherapy;Electrical Stimulation;Moist Heat;Ultrasound;Gait training;Stair training;Patient/family education;Therapeutic exercise;Manual techniques;Passive range of motion;Taping;Dry needling;Therapeutic activities   PT Next Visit Plan Continue with manual therapy and modalities to thoracic musculature and R forearm as symptoms dictate per MPT POC.   Consulted and Agree with Plan of Care Patient      Patient will benefit from skilled therapeutic intervention in order to improve the following deficits and impairments:  Pain, Decreased activity tolerance  Visit Diagnosis: Pain in right hip  Pain in right thigh  Pain in thoracic spine  Pain in right forearm     Problem List Patient Active Problem List   Diagnosis Date Noted  . Contusion, knee and lower leg, left, initial encounter 02/05/2016  . Contusion, forearm and elbow, right, initial encounter 02/05/2016  . Groin strain 02/05/2016  . Upper back strain 02/05/2016  . Hyperlipidemia 10/24/2015  . Benign essential HTN 10/24/2015  . Psoriasis 10/24/2015  . Spinal stenosis 10/24/2015    Wynelle Fanny, PTA 04/08/2016, 4:27 PM  [facility] [place], [place], [address] Phone: [phone]   Fax:  [phone]  Name: Kyley Solow MRN: [id number] Date of Birth: [birth year]

## 2016-04-10 ENCOUNTER — Encounter: Payer: Self-pay | Admitting: Physical Therapy

## 2016-04-14 ENCOUNTER — Encounter: Payer: Self-pay | Admitting: Family Medicine

## 2016-04-15 ENCOUNTER — Ambulatory Visit: Payer: Worker's Compensation | Attending: Family Medicine | Admitting: Physical Therapy

## 2016-04-15 ENCOUNTER — Encounter: Payer: Self-pay | Admitting: Physical Therapy

## 2016-04-15 DIAGNOSIS — M25551 Pain in right hip: Secondary | ICD-10-CM | POA: Insufficient documentation

## 2016-04-15 DIAGNOSIS — M546 Pain in thoracic spine: Secondary | ICD-10-CM | POA: Insufficient documentation

## 2016-04-15 DIAGNOSIS — M79651 Pain in right thigh: Secondary | ICD-10-CM

## 2016-04-15 DIAGNOSIS — M79631 Pain in right forearm: Secondary | ICD-10-CM | POA: Diagnosis present

## 2016-04-15 NOTE — Therapy (Signed)
[facility] [address] [place], [place], [address] Phone: [phone]   Fax:  [phone]  Physical Therapy Treatment  Patient Details  Name: Virginia Marks MRN: [id number] Date of Birth: 12/30/1955 Referring Provider: Warren Stacks  Encounter Date: 04/15/2016      PT End of Session - 04/15/16 1701    Visit Number 16   Number of Visits 24   Date for PT Re-Evaluation 04/25/16   PT Start Time 1701   PT Stop Time 1747   PT Time Calculation (min) 46 min   Activity Tolerance Patient tolerated treatment well   Behavior During Therapy WFL for tasks assessed/performed      Past Medical History:  Diagnosis Date  . Hyperlipidemia   . Hypertension   . Psoriasis    SCALP  . Spinal stenosis     Past Surgical History:  Procedure Laterality Date  . BREAST EXCISIONAL BIOPSY Right 2001   negative  . BREAST EXCISIONAL BIOPSY Right 2002   negative    There were no vitals filed for this visit.      Subjective Assessment - 04/15/16 1659    Subjective Reports that over the weekend she found herself favoring the LLE with activities such as washing dishes. Will call and check regarding orthopedics referral. Patient reports she was noticing recently that she is walking better and felt almost back to normal and reports that her back is feeling better as well although she still has that L hip discomfort.   Pertinent History HTN, OP, spinal stenosis, lumbar fusion 2016; neck fusion 6/17; L3-S1 numerous surgeries previously   How long can you stand comfortably? 20 min   Patient Stated Goals to get rid of pain   Currently in Pain? Yes   Pain Score 4    Pain Location Hip  and upper back   Pain Orientation Right;Left   Pain Descriptors / Indicators Tightness  "tightness causing discomfort" but not pain per patient report   Pain Type Acute pain   Pain Onset More than a month ago            [facility] PT Assessment - 04/15/16 0001      Assessment   Medical Diagnosis R hip pain; R hip strain   Onset Date/Surgical Date 02/04/16   Next MD Visit TBD with orthopedic     Precautions   Precautions None                     [facility] Adult PT Treatment/Exercise - 04/15/16 0001      Modalities   Modalities Electrical Stimulation;Moist Heat;Ultrasound     Moist Heat Therapy   Number Minutes Moist Heat 15 Minutes   Moist Heat Location Hip;Other (comment)  thoracic spine     Electrical Stimulation   Electrical Stimulation Location B thoracic paraspinals, B glute   Electrical Stimulation Action 2 channels: Pre-Mod   Electrical Stimulation Parameters 80-150 hz x15 min   Electrical Stimulation Goals Tone  to decrease tone     Ultrasound   Ultrasound Location B thoracic paraspinals   Ultrasound Parameters 1.5 w/cm2 ,100%, 1 mhz x10 min   Ultrasound Goals Other (Comment)  to decrease tone     Manual Therapy   Manual Therapy Soft tissue mobilization   Soft tissue mobilization STW to B thoracic paraspinals, scapular retractors, R glute to decrease pain and tightness in prone                       PT Long Term Goals - 04/08/16 1559      PT LONG TERM GOAL #1   Title I with HEP   Time 2   Period Weeks   Status Achieved     PT LONG TERM GOAL #2   Title Patient able to climb stairs with a reciprocal gait with or without UE support without R leg/hip pain.   Time 6   Period Weeks   Status Partially Met  Nonreciprical at this time but due to L hip/knee per patient report 04/09/2016     PT LONG TERM GOAL #3   Title Patient to be able to get on/off commode with 1/10 pain or less in R leg/hip.   Time 6   Period Weeks   Status Achieved     PT LONG TERM GOAL #4   Title Patient to be able to stand for ADLs with 1/10 or less pain in R leg/hip.   Time 6   Period Weeks   Status Partially Met  Improving as she was able to do dishes for 10 minutes when pain began instead of immediate pain per patient  report 04/08/2016     PT LONG TERM GOAL #5   Title Patient to be able to ambulate community distances without pain in R hip/leg.   Time 6   Period Weeks   Status Achieved     PT LONG TERM GOAL #6   Title Perform ADL's with thoracic and right forearm pain not > 2/10.   Time 6   Period Weeks   Status On-going  Pain in thoracic spine after 10 minutes of ADLs instead of immediate pain per patient report 04/08/2016               Plan - 04/15/16 1739    Clinical Impression Statement Patient arrived to treatment with improvement in regards to not having pain and able to ambulate with improved normalcy. Patient's upper back observed with decrease inflammation and tone overall with minimal tone noted in B thoracic paraspinals. Patient's hips continues to have minimal tightness as well. Normal modalities response noted following removal of the modalities.   Rehab Potential Excellent   PT Frequency 2x / week   PT Duration 6 weeks   PT Treatment/Interventions ADLs/Self Care Home Management;Cryotherapy;Electrical Stimulation;Moist Heat;Ultrasound;Gait training;Stair training;Patient/family education;Therapeutic exercise;Manual techniques;Passive range of motion;Taping;Dry needling;Therapeutic activities   PT Next Visit Plan Continue with manual therapy and modalities to thoracic musculature and R forearm as symptoms dictate per MPT POC.   Consulted and Agree with Plan of Care Patient      Patient will benefit from skilled therapeutic intervention in order to improve the following deficits and impairments:  Pain, Decreased activity tolerance  Visit Diagnosis: Pain in right hip  Pain in right thigh  Pain in thoracic spine  Pain in right forearm     Problem List Patient Active Problem List   Diagnosis Date Noted  . Contusion, knee and lower leg, left, initial encounter 02/05/2016  . Contusion, forearm and elbow, right, initial encounter 02/05/2016  . Groin strain 02/05/2016  .  Upper back strain 02/05/2016  . Hyperlipidemia 10/24/2015  . Benign essential HTN 10/24/2015  . Psoriasis 10/24/2015  . Spinal stenosis 10/24/2015    Kelsey M Parsons, PTA 04/15/2016, 5:52 PM  [facility] [address] [place], [place], [address] Phone: [phone]   Fax:  [phone]  Name: Virginia Marks MRN: [id number] Date of Birth: 04/25/1955    

## 2016-04-15 NOTE — Telephone Encounter (Signed)
Called [facility] in [place], [place]- they require the worker's comp adjuster to call and make the referral.  Adjuster on claim now is Tax inspectorDeborah Marks.  Called Virginia PomfretDeborah Marks at [phone] to ask her to call and make the referral.  Virginia PoundDeborah states she will make the call and do the referral to [facility] in [place].

## 2016-04-22 ENCOUNTER — Ambulatory Visit: Payer: Worker's Compensation | Admitting: Physical Therapy

## 2016-04-22 ENCOUNTER — Encounter: Payer: Self-pay | Admitting: Physical Therapy

## 2016-04-22 DIAGNOSIS — M25551 Pain in right hip: Secondary | ICD-10-CM | POA: Diagnosis not present

## 2016-04-22 DIAGNOSIS — M546 Pain in thoracic spine: Secondary | ICD-10-CM

## 2016-04-22 DIAGNOSIS — M79651 Pain in right thigh: Secondary | ICD-10-CM

## 2016-04-22 DIAGNOSIS — M79631 Pain in right forearm: Secondary | ICD-10-CM

## 2016-04-22 NOTE — Therapy (Addendum)
[facility] [place], [place], [address] Phone: [phone]   Fax:  [phone]  Physical Therapy Treatment  Patient Details  Name: Virginia Marks MRN: [id number] Date of Birth: Nov 21, 1955 Referring Provider: Claretta Marks  Encounter Date: 04/22/2016      PT End of Session - 04/22/16 1659    Visit Number 17   Number of Visits 24   Date for PT Re-Evaluation 04/25/16   PT Start Time 1703   PT Stop Time 3244  2 units secondary to late arrival and denial of electrical stimulation   PT Time Calculation (min) 40 min   Activity Tolerance Patient tolerated treatment well   Behavior During Therapy [name] for tasks assessed/performed      Past Medical History:  Diagnosis Date  . Hyperlipidemia   . Hypertension   . Psoriasis    SCALP  . Spinal stenosis     Past Surgical History:  Procedure Laterality Date  . BREAST EXCISIONAL BIOPSY Right 2001   negative  . BREAST EXCISIONAL BIOPSY Right 2002   negative    There were no vitals filed for this visit.      Subjective Assessment - 04/22/16 1701    Subjective Reports that she is to see orthopedic MD soon. Reports that her hips have been bothering her. Reports that gait is off noticably. Patient no longer has pain in upper back with bed mobility.   Pertinent History HTN, OP, spinal stenosis, lumbar fusion 2016; neck fusion 6/17; L3-S1 numerous surgeries previously   How long can you stand comfortably? 20 min   Patient Stated Goals to get rid of pain   Currently in Pain? Yes   Pain Score 4    Pain Location Hip   Pain Orientation Right;Left   Pain Descriptors / Indicators Discomfort;Tightness   Pain Type Acute pain   Pain Radiating Towards From low back   Pain Onset More than a month ago   Pain Score 3   Pain Location Back   Pain Orientation Upper   Pain Descriptors / Indicators Tightness   Pain Type Acute pain   Pain Onset More than a month ago             [name] PT Assessment - 04/22/16 0001      Assessment   Medical Diagnosis R hip pain; R hip strain   Onset Date/Surgical Date 02/04/16   Next MD Visit Next week     Precautions   Precautions None                     [facility] Adult PT Treatment/Exercise - 04/22/16 0001      Modalities   Modalities Moist Heat;Ultrasound     Moist Heat Therapy   Number Minutes Moist Heat 10 Minutes   Moist Heat Location Hip;Other (comment)  thoracic spine     Ultrasound   Ultrasound Location B thoracic paraspinals   Ultrasound Parameters 1.5 w/cm2, 100%, 1 mhz x10 min   Ultrasound Goals Other (Comment)  tone     Manual Therapy   Manual Therapy Soft tissue mobilization   Soft tissue mobilization STW to B thoracic paraspinals, scapular retractors, R glute to decrease pain and tightness in prone                     PT Long Term Goals - 04/08/16 1559      PT LONG TERM GOAL #1   Title I with HEP  Time 2   Period Weeks   Status Achieved     PT LONG TERM GOAL #2   Title Patient able to climb stairs with a reciprocal gait with or without UE support without R leg/hip pain.   Time 6   Period Weeks   Status Partially Met  Nonreciprical at this time but due to L hip/knee per patient report 04/09/2016     PT LONG TERM GOAL #3   Title Patient to be able to get on/off commode with 1/10 pain or less in R leg/hip.   Time 6   Period Weeks   Status Achieved     PT LONG TERM GOAL #4   Title Patient to be able to stand for ADLs with 1/10 or less pain in R leg/hip.   Time 6   Period Weeks   Status Partially Met  Improving as she was able to do dishes for 10 minutes when pain began instead of immediate pain per patient report 04/08/2016     PT LONG TERM GOAL #5   Title Patient to be able to ambulate community distances without pain in R hip/leg.   Time 6   Period Weeks   Status Achieved     PT LONG TERM GOAL #6   Title Perform ADL's with thoracic and right forearm pain not  > 2/10.   Time 6   Period Weeks   Status On-going  Pain in thoracic spine after 10 minutes of ADLs instead of immediate pain per patient report 04/08/2016               Plan - 04/22/16 1748    Clinical Impression Statement Patient arrived to treatment with continued reports of tightness in thoracic paraspinals and hip region. Patient especially reports feeling tightness around the sides of the sacrum to greater trochanters. An HEP for piriformis stretch has been provided for patient several treatments prior to today. Normal modalities response noted following removal of the modalities. Patient presented with minimally increased tone and inflammation along R scapular retractors. Patient also presented with tightness along sides of the sacrum. Patient denied any electrical stimulation today.   Rehab Potential Excellent   PT Frequency 2x / week   PT Duration 6 weeks   PT Treatment/Interventions ADLs/Self Care Home Management;Cryotherapy;Electrical Stimulation;Moist Heat;Ultrasound;Gait training;Stair training;Patient/family education;Therapeutic exercise;Manual techniques;Passive range of motion;Taping;Dry needling;Therapeutic activities   PT Next Visit Plan Place patient's chart on hold as she is to see orthopedic MD next week.   Consulted and Agree with Plan of Care Patient      Patient will benefit from skilled therapeutic intervention in order to improve the following deficits and impairments:  Pain, Decreased activity tolerance  Visit Diagnosis: Pain in right hip  Pain in right thigh  Pain in thoracic spine  Pain in right forearm     Problem List Patient Active Problem List   Diagnosis Date Noted  . Contusion, knee and lower leg, left, initial encounter 02/05/2016  . Contusion, forearm and elbow, right, initial encounter 02/05/2016  . Groin strain 02/05/2016  . Upper back strain 02/05/2016  . Hyperlipidemia 10/24/2015  . Benign essential HTN 10/24/2015  . Psoriasis  10/24/2015  . Spinal stenosis 10/24/2015    Ahmed Prima, PTA 04/22/16 5:54 PM  [facility] [address] [place], [place], [address] Phone: [phone]   Fax:  [phone]  Name: Virginia Marks MRN: [id number] Date of Birth: September 11, 1955   PHYSICAL THERAPY DISCHARGE SUMMARY  Visits from Start of  Care: 17.  Current functional level related to goals / functional outcomes: See above.   Remaining deficits: See goal section.   Education / Equipment: HEP. Plan: Patient agrees to discharge.  Patient goals were partially met. Patient is being discharged due to meeting the stated rehab goals.  ?????         Mali Applegate MPT

## 2016-04-24 ENCOUNTER — Encounter: Payer: Self-pay | Admitting: Physical Therapy

## 2016-04-28 ENCOUNTER — Other Ambulatory Visit: Payer: Self-pay | Admitting: Cardiology

## 2016-07-12 ENCOUNTER — Ambulatory Visit (HOSPITAL_COMMUNITY)
Admission: RE | Admit: 2016-07-12 | Discharge: 2016-07-12 | Disposition: A | Discharge status: Home / Self Care | Payer: BC Managed Care – PPO | Source: Ambulatory Visit | Attending: Adult Health Nurse Practitioner | Admitting: Adult Health Nurse Practitioner

## 2016-07-12 ENCOUNTER — Other Ambulatory Visit (HOSPITAL_COMMUNITY): Payer: Self-pay | Admitting: Adult Health Nurse Practitioner

## 2016-07-12 DIAGNOSIS — R059 Cough, unspecified: Secondary | ICD-10-CM

## 2016-07-12 DIAGNOSIS — R05 Cough: Secondary | ICD-10-CM | POA: Insufficient documentation

## 2016-10-06 ENCOUNTER — Encounter: Payer: Self-pay | Admitting: Cardiology

## 2016-10-06 ENCOUNTER — Encounter: Payer: Self-pay | Admitting: *Deleted

## 2016-10-06 ENCOUNTER — Ambulatory Visit: Payer: BC Managed Care – PPO | Admitting: Adult Health

## 2016-10-06 ENCOUNTER — Ambulatory Visit (INDEPENDENT_AMBULATORY_CARE_PROVIDER_SITE_OTHER): Payer: BC Managed Care – PPO | Admitting: Cardiology

## 2016-10-06 VITALS — BP 122/68 | HR 69 | Ht 64.0 in | Wt 236.0 lb

## 2016-10-06 DIAGNOSIS — R0600 Dyspnea, unspecified: Secondary | ICD-10-CM | POA: Diagnosis not present

## 2016-10-06 DIAGNOSIS — R600 Localized edema: Secondary | ICD-10-CM

## 2016-10-06 DIAGNOSIS — G4730 Sleep apnea, unspecified: Secondary | ICD-10-CM | POA: Diagnosis not present

## 2016-10-06 NOTE — Progress Notes (Signed)
Clinical Summary Virginia Marks is a 61 y.o.female seen today for follow up of the following medical problems. This is a focused visit for recent troubles with LE edema and SOB.    1. LE edema - recent echo did show some evidence of diastolic dysfunction - recent 8 week bout of URI starting around March. Had CXR that was clear. Productive cough, soreness at chest. SOB x few months - treated with abx x 2 rounds, course of prednisone - cough resolved. Still feels weak, some SOB with activity.  - weight increased over the last several months.  - multiple surgeries over the last few years, sedentary lifestyle.  - taking lasix daily.      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    Past Medical History:  Diagnosis Date  . Hyperlipidemia   . Hypertension   . Psoriasis    SCALP  . Spinal stenosis      Allergies  Allergen Reactions  . Sulfa Antibiotics Rash     Current Outpatient Prescriptions  Medication Sig Dispense Refill  . aspirin 325 MG tablet Take 325 mg by mouth daily.    . clobetasol (TEMOVATE) 0.05 % external solution Apply 1 application topically 2 (two) times daily.    . diclofenac (VOLTAREN) 75 MG EC tablet Take 1 tablet (75 mg total) by mouth 2 (two) times daily. For muscle and  Joint pain 60 tablet 2  . furosemide (LASIX) 20 MG tablet Take 1 tablet (20 mg total) by mouth daily as needed. 90 tablet 3  . gabapentin (NEURONTIN) 300 MG capsule Take 300 mg by mouth 2 (two) times daily.     . hydrOXYzine (ATARAX/VISTARIL) 25 MG tablet Take 25 mg by mouth daily. 1-2 tABLETS @BEDTIME     . metoprolol tartrate (LOPRESSOR) 25 MG tablet TAKE 1/2 TABLET BY MOUTH 2 TIMES A DAY. 30 tablet 6  . potassium chloride SA (K-DUR,KLOR-CON) 20 MEQ tablet TAKE 1 TAB DAILY ON DAYS TAKING LASIX 90 tablet 3  . sertraline (ZOLOFT) 100 MG tablet Take 100 mg by mouth daily.    . traMADol (ULTRAM) 50 MG tablet Take by mouth every 6 (six) hours as needed.     No current  facility-administered medications for this visit.      Past Surgical History:  Procedure Laterality Date  . BREAST EXCISIONAL BIOPSY Right 2001   negative  . BREAST EXCISIONAL BIOPSY Right 2002   negative     Allergies  Allergen Reactions  . Sulfa Antibiotics Rash      Family History  Problem Relation Age of Onset  . Breast cancer Sister 87  . Breast cancer Maternal Aunt 70  . Breast cancer Maternal Aunt 70  . Breast cancer Maternal Aunt 80  . Breast cancer Maternal Aunt 70  . Hypertension Mother      Social History Virginia Marks reports that she has never smoked. She has never used smokeless tobacco. Virginia Marks reports that she does not drink alcohol.   Review of Systems CONSTITUTIONAL: No weight loss, fever, chills, weakness or fatigue.  HEENT: Eyes: No visual loss, blurred vision, double vision or yellow sclerae.No hearing loss, sneezing, congestion, runny nose or sore throat.  SKIN: No rash or itching.  CARDIOVASCULAR: per hpi RESPIRATORY: per hpi GASTROINTESTINAL: No anorexia, nausea, vomiting or diarrhea. No abdominal pain or blood.  GENITOURINARY: No burning on urination, no polyuria NEUROLOGICAL: No headache, dizziness, syncope, paralysis, ataxia, numbness or tingling in the extremities. No change in  bowel or bladder control.  MUSCULOSKELETAL: No muscle, back pain, joint pain or stiffness.  LYMPHATICS: No enlarged nodes. No history of splenectomy.  PSYCHIATRIC: No history of depression or anxiety.  ENDOCRINOLOGIC: No reports of sweating, cold or heat intolerance. No polyuria or polydipsia.  Marland Kitchen.   Physical Examination Vitals:   10/06/16 1325  BP: 122/68  Pulse: 69   Vitals:   10/06/16 1325  Weight: 236 lb (107 kg)  Height: 5\' 4"  (1.626 m)    Gen: resting comfortably, no acute distress HEENT: no scleral icterus, pupils equal round and reactive, no palptable cervical adenopathy,  CV: RRR, no m/r/g, no jvd Resp: Clear to auscultation bilaterally GI:  abdomen is soft, non-tender, non-distended, normal bowel sounds, no hepatosplenomegaly MSK: extremities are warm, no edema.  Skin: warm, no rash Neuro:  no focal deficits Psych: appropriate affect   Diagnostic Studies 10/2015 echo Study Conclusions  - Left ventricle: The cavity size was normal. Wall thickness was increased in a pattern of mild LVH. Systolic function was normal. The estimated ejection fraction was in the range of 60% to 65%. Wall motion was normal; there were no regional wall motion abnormalities. Features are consistent with a pseudonormal left ventricular filling pattern, with concomitant abnormal relaxation and increased filling pressure (grade 2 diastolic dysfunction). - Aortic valve: Mildly calcified annulus. Trileaflet. There was very mild stenosis. Peak velocity (S): 200 cm/s. Mean gradient (S): 8 mm Hg. Valve area (VTI): 1.69 cm^2. Valve area (Vmax): 1.72 cm^2. Valve area (Vmean): 1.79 cm^2. - Mitral valve: Calcified annulus.    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   Assessment and Plan   1. LE edema/DOE - recent increase in edema that is resolving, she will continue lasix at this time.  - continue to limit sodium intake   2. OSA screen - signs and symptoms of OSA, we will refer to Dr Juanetta GoslingHawkins   Antoine PocheJonathan F. Karolyn Messing, M.D.

## 2016-10-06 NOTE — Patient Instructions (Signed)
Your physician wants you to follow-up in: 6 Months with Dr. Wyline MoodBranch. You will receive a reminder letter in the mail two months in advance. If you don't receive a letter, please call our office to schedule the follow-up appointment.  Your physician recommends that you continue on your current medications as directed. Please refer to the Current Medication list given to you today.  You have been referred to Dr. Juanetta GoslingHawkins.  If you need a refill on your cardiac medications before your next appointment, please call your pharmacy.  Thank you for choosing [facility]!

## 2016-12-23 ENCOUNTER — Other Ambulatory Visit: Payer: Self-pay | Admitting: Cardiology

## 2017-01-12 ENCOUNTER — Other Ambulatory Visit: Payer: Self-pay | Admitting: Cardiology

## 2017-04-07 HISTORY — PX: ROTATOR CUFF REPAIR: SHX139

## 2017-05-28 DIAGNOSIS — M4712 Other spondylosis with myelopathy, cervical region: Secondary | ICD-10-CM | POA: Insufficient documentation

## 2017-06-18 ENCOUNTER — Ambulatory Visit: Payer: BC Managed Care – PPO | Admitting: Cardiology

## 2017-06-26 ENCOUNTER — Encounter: Payer: Self-pay | Admitting: Cardiology

## 2017-06-26 ENCOUNTER — Ambulatory Visit (INDEPENDENT_AMBULATORY_CARE_PROVIDER_SITE_OTHER): Payer: BC Managed Care – PPO | Admitting: Cardiology

## 2017-06-26 VITALS — BP 130/80 | HR 63 | Ht 64.0 in | Wt 242.4 lb

## 2017-06-26 DIAGNOSIS — G4739 Other sleep apnea: Secondary | ICD-10-CM | POA: Diagnosis not present

## 2017-06-26 DIAGNOSIS — R600 Localized edema: Secondary | ICD-10-CM | POA: Diagnosis not present

## 2017-06-26 DIAGNOSIS — R002 Palpitations: Secondary | ICD-10-CM | POA: Diagnosis not present

## 2017-06-26 MED ORDER — METOPROLOL TARTRATE 25 MG PO TABS
ORAL_TABLET | ORAL | 6 refills | Status: DC
Start: 2017-06-26 — End: 2018-07-13

## 2017-06-26 NOTE — Progress Notes (Signed)
es    Clinical Summary Virginia Marks is a 62 y.o.female seen today for follow up of the following medical problem.s    1. LE edema - 2017 echo with LVEF 60-65%, grade II diastolic dysfunction - limiting sodium intake. Takes lasix as needed.    2. Palpitations - heart monitor 10/2015 with SR, sinus tach and occasional PVCs - no recent symptoms.    3. OSA screen - previously referred to Dr Juanetta Gosling - has not been able to see - + snoring, unsure apneic of episodes, prior daytime somnolence that is resolved.     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     Past Medical History:  Diagnosis Date  . Hyperlipidemia   . Hypertension   . Psoriasis    SCALP  . Spinal stenosis      Allergies  Allergen Reactions  . Sulfa Antibiotics Rash     Current Outpatient Medications  Medication Sig Dispense Refill  . aspirin 325 MG tablet Take 325 mg by mouth daily.    . clobetasol (TEMOVATE) 0.05 % external solution Apply 1 application topically 2 (two) times daily.    . diclofenac (VOLTAREN) 75 MG EC tablet Take 1 tablet (75 mg total) by mouth 2 (two) times daily. For muscle and  Joint pain 60 tablet 2  . furosemide (LASIX) 20 MG tablet TAKE ONE TABLET BY MOUTH DAILY AS NEEDED. 30 tablet 0  . gabapentin (NEURONTIN) 300 MG capsule Take 300 mg by mouth 2 (two) times daily.     . metoprolol tartrate (LOPRESSOR) 25 MG tablet TAKE 1/2 TABLET BY MOUTH 2 TIMES A DAY. 30 tablet 6  . potassium chloride SA (K-DUR,KLOR-CON) 20 MEQ tablet TAKE ONE TABLET BY MOUTH DAILY WHILE TAKING FUROSEMIDE. 30 tablet 0  . sertraline (ZOLOFT) 100 MG tablet Take 100 mg by mouth daily.    . traMADol (ULTRAM) 50 MG tablet Take by mouth every 6 (six) hours as needed.     No current facility-administered medications for this visit.      Past Surgical History:  Procedure Laterality Date  .  BREAST EXCISIONAL BIOPSY Right 2001   negative  . BREAST EXCISIONAL BIOPSY Right 2002   negative     Allergies  Allergen Reactions  . Sulfa Antibiotics Rash      Family History  Problem Relation Age of Onset  . Breast cancer Sister 32  . Breast cancer Maternal Aunt 70  . Breast cancer Maternal Aunt 70  . Breast cancer Maternal Aunt 80  . Breast cancer Maternal Aunt 70  . Hypertension Mother      Social History Virginia Marks reports that she has never smoked. She has never used smokeless tobacco. Virginia Marks reports that she does not drink alcohol.   Review of Systems CONSTITUTIONAL: No weight loss, fever, chills, weakness or fatigue.  HEENT: Eyes: No visual loss, blurred vision, double vision or yellow sclerae.No hearing loss, sneezing, congestion, runny nose or sore throat.  SKIN: No rash or itching.  CARDIOVASCULAR: per hpi RESPIRATORY: No shortness of breath, cough or sputum.  GASTROINTESTINAL: No anorexia, nausea, vomiting or diarrhea. No abdominal pain or blood.  GENITOURINARY: No burning on urination, no polyuria NEUROLOGICAL: No headache, dizziness, syncope, paralysis, ataxia, numbness or tingling in the extremities. No change in bowel or bladder control.  MUSCULOSKELETAL: No muscle, back pain, joint pain or stiffness.  LYMPHATICS:  No enlarged nodes. No history of splenectomy.  PSYCHIATRIC: No history of depression or anxiety.  ENDOCRINOLOGIC: No reports of sweating, cold or heat intolerance. No polyuria or polydipsia.  Marland Kitchen.   Physical Examination Vitals:   06/26/17 1627  BP: 130/80  Pulse: 63  SpO2: 96%   Vitals:   06/26/17 1627  Weight: 242 lb 6.4 oz (110 kg)  Height: 5\' 4"  (1.626 m)    Gen: resting comfortably, no acute distress HEENT: no scleral icterus, pupils equal round and reactive, no palptable cervical adenopathy,  CV: RRR, no m/r/g, no jvd Resp: Clear to auscultation bilaterally GI: abdomen is soft, non-tender, non-distended, normal bowel  sounds, no hepatosplenomegaly MSK: extremities are warm, no edema.  Skin: warm, no rash Neuro:  no focal deficits Psych: appropriate affect   Diagnostic Studies 10/2015 echo Study Conclusions  - Left ventricle: The cavity size was normal. Wall thickness was increased in a pattern of mild LVH. Systolic function was normal. The estimated ejection fraction was in the range of 60% to 65%. Wall motion was normal; there were no regional wall motion abnormalities. Features are consistent with a pseudonormal left ventricular filling pattern, with concomitant abnormal relaxation and increased filling pressure (grade 2 diastolic dysfunction). - Aortic valve: Mildly calcified annulus. Trileaflet. There was very mild stenosis. Peak velocity (S): 200 cm/s. Mean gradient (S): 8 mm Hg. Valve area (VTI): 1.69 cm^2. Valve area (Vmax): 1.72 cm^2. Valve area (Vmean): 1.79 cm^2. - Mitral valve: Calcified annulus.    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      Assessment and Plan  1. LE edema/DOE - continue prn lasix.     2. Palpitations - monitor with just occasonal PVCs. - no recent symptoms, continue to monotir  3. OSA screen - signs and symptoms of OSA, we will refer to Dr Juanetta GoslingHawkins.     Antoine PocheJonathan F. Aser Marks, M.D.

## 2017-06-26 NOTE — Patient Instructions (Signed)
Your physician wants you to follow-up in: 1 year with Dr Lurena JoinerBranch You will receive a reminder letter in the mail two months in advance. If you don't receive a letter, please call our office to schedule the follow-up appointment.    Your physician recommends that you continue on your current medications as directed. Please refer to the Current Medication list given to you today.   If you need a refill on your cardiac medications before your next appointment, please call your pharmacy.    No lab work or tests ordered today.    You have been referred to Dr Renae Fickleed Juanetta GoslingHawkins, pulmonary for sleep apnea, they will call you for apt [phone]   Thank you for choosing [facility] !

## 2017-06-30 ENCOUNTER — Other Ambulatory Visit: Payer: Self-pay | Admitting: Cardiology

## 2017-07-01 ENCOUNTER — Encounter: Payer: Self-pay | Admitting: Cardiology

## 2017-07-13 ENCOUNTER — Other Ambulatory Visit (HOSPITAL_COMMUNITY): Payer: Self-pay | Admitting: Internal Medicine

## 2017-07-13 DIAGNOSIS — R599 Enlarged lymph nodes, unspecified: Secondary | ICD-10-CM

## 2017-07-16 ENCOUNTER — Other Ambulatory Visit (HOSPITAL_COMMUNITY): Payer: Self-pay | Admitting: Internal Medicine

## 2017-07-16 DIAGNOSIS — R10819 Abdominal tenderness, unspecified site: Secondary | ICD-10-CM

## 2017-07-17 ENCOUNTER — Other Ambulatory Visit: Payer: Self-pay | Admitting: Nurse Practitioner

## 2017-07-17 ENCOUNTER — Ambulatory Visit (HOSPITAL_COMMUNITY): Payer: BC Managed Care – PPO

## 2017-07-17 ENCOUNTER — Ambulatory Visit (HOSPITAL_COMMUNITY)
Admission: RE | Admit: 2017-07-17 | Discharge: 2017-07-17 | Disposition: A | Discharge status: Home / Self Care | Payer: BC Managed Care – PPO | Source: Ambulatory Visit | Attending: Internal Medicine | Admitting: Internal Medicine

## 2017-07-17 DIAGNOSIS — R599 Enlarged lymph nodes, unspecified: Secondary | ICD-10-CM | POA: Insufficient documentation

## 2017-07-17 DIAGNOSIS — R10819 Abdominal tenderness, unspecified site: Secondary | ICD-10-CM

## 2017-07-28 ENCOUNTER — Ambulatory Visit (HOSPITAL_COMMUNITY): Payer: BC Managed Care – PPO

## 2017-07-30 ENCOUNTER — Other Ambulatory Visit (HOSPITAL_BASED_OUTPATIENT_CLINIC_OR_DEPARTMENT_OTHER): Payer: Self-pay

## 2017-07-30 DIAGNOSIS — G4730 Sleep apnea, unspecified: Secondary | ICD-10-CM

## 2017-07-30 DIAGNOSIS — G4710 Hypersomnia, unspecified: Secondary | ICD-10-CM

## 2017-07-30 DIAGNOSIS — R0683 Snoring: Secondary | ICD-10-CM

## 2017-08-17 ENCOUNTER — Other Ambulatory Visit: Payer: Self-pay | Admitting: Cardiology

## 2017-09-21 ENCOUNTER — Telehealth: Payer: Self-pay | Admitting: Cardiology

## 2017-09-21 NOTE — Telephone Encounter (Signed)
I can see her soon, this Wed 340pm if she can. If significant progression of symptoms prior to that needs to be seen in ER   J BrancH MD

## 2017-09-21 NOTE — Telephone Encounter (Signed)
Patient informed and verbalized understanding of plan. 

## 2017-09-21 NOTE — Telephone Encounter (Signed)
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 Took aspirin 325 mg and sharpness of the pain resolved but still feel a little discomfort in her chest while on the phone rated 4-5/10. No c/o dizziness or sob. Patient said that she has sob all the time and that it hasn't changed. Patient said that this problem with chest pain started about 2 months ago. Patient also c/o having trouble swallowing. BP 136/66 & HR-59. Patient advised that she needed an evaluation and appointment given for 10/20/17 with Ronie Spiesayna Dunn and advised patient to contact her PCP for an appointment to have her symptoms evaluated. Patient advised that if her symptoms got worse, that she needed to go to the ED for an evaluation. Verbalized understanding of plan.

## 2017-09-21 NOTE — Telephone Encounter (Signed)
Per pt phone call -- she's been having an angina episodes for about 2 wks, just had an episode that lasted for about 5 mins please give her a call

## 2017-09-23 ENCOUNTER — Ambulatory Visit: Payer: BC Managed Care – PPO | Attending: Pulmonary Disease | Admitting: Neurology

## 2017-09-23 ENCOUNTER — Ambulatory Visit: Payer: BC Managed Care – PPO | Admitting: Cardiology

## 2017-09-23 ENCOUNTER — Telehealth: Payer: Self-pay | Admitting: Cardiology

## 2017-09-23 ENCOUNTER — Encounter: Payer: Self-pay | Admitting: Cardiology

## 2017-09-23 VITALS — BP 110/64 | HR 66 | Ht 64.5 in | Wt 244.8 lb

## 2017-09-23 DIAGNOSIS — R002 Palpitations: Secondary | ICD-10-CM

## 2017-09-23 DIAGNOSIS — G4730 Sleep apnea, unspecified: Secondary | ICD-10-CM | POA: Diagnosis not present

## 2017-09-23 DIAGNOSIS — R079 Chest pain, unspecified: Secondary | ICD-10-CM

## 2017-09-23 DIAGNOSIS — G4710 Hypersomnia, unspecified: Secondary | ICD-10-CM | POA: Insufficient documentation

## 2017-09-23 DIAGNOSIS — R0683 Snoring: Secondary | ICD-10-CM | POA: Insufficient documentation

## 2017-09-23 DIAGNOSIS — R600 Localized edema: Secondary | ICD-10-CM

## 2017-09-23 MED ORDER — NITROGLYCERIN 0.4 MG SL SUBL: 0.4000 mg | SUBLINGUAL_TABLET | SUBLINGUAL | 3 refills | Status: DC | PRN

## 2017-09-23 NOTE — Progress Notes (Signed)
Clinical Summary [name] is a 62 y.o.female seen today for follow up of the following medical problems   1. Chest pain - started about 2 months,4 episodes - last episode while driving on Monday. Woke up that morning mild pain under left breast.  - sharp pain midchest to left chest pain, 10/10 in severity. She reports chronic SOB, unsure if worst during episode. Feeling of heart racing.  - pain lasted 3 minutes, resolved on its own. - other episodes often at night  - has been walking her dog x 0.5 miles a few times a day, sometimes can have some SOB with it  CAD risk factors: HTN, HL    2. LE edema - 2017 echo with LVEF 60-65%, grade II diastolic dysfunction - limiting sodium intake. Takes lasix as needed.  - no recent issues.     3. Palpitations - heart monitor 10/2015 with SR, sinus tach and occasional PVCs -denies any severe symptoms recently.    4. OSA screen - previously referred to Dr Juanetta GoslingHawkins - has not been able to see - + snoring, unsure apneic of episodes, prior daytime somnolence that is resolved.     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 Has 2 foreign students living with her that just moved back, awaiting more exchange students after the summer    Past Medical History:  Diagnosis Date  . Hyperlipidemia   . Hypertension   . Psoriasis    SCALP  . Spinal stenosis      Allergies  Allergen Reactions  . Sulfa Antibiotics Rash     Current Outpatient Medications  Medication Sig Dispense Refill  . aspirin 325 MG tablet Take 325 mg by mouth daily.    . Biotin 10 MG CAPS Take 1 capsule by mouth daily.    . calcium-vitamin D (OSCAL 500/200 D-3) 500-200 MG-UNIT tablet Take 1 tablet by mouth daily with breakfast.    . clobetasol (TEMOVATE) 0.05 % external solution Apply 1 application topically 2 (two) times daily.    . diclofenac (VOLTAREN) 75 MG EC tablet  Take 1 tablet (75 mg total) by mouth 2 (two) times daily. For muscle and  Joint pain 60 tablet 2  . DOCOSAHEXAENOIC ACID PO Take 1 capsule by mouth daily.    . furosemide (LASIX) 20 MG tablet TAKE ONE TABLET BY MOUTH DAILY AS NEEDED. 30 tablet 0  . gabapentin (NEURONTIN) 300 MG capsule Take 300 mg by mouth 3 (three) times daily.     . Glucosamine-Chondroitin 1500-1200 MG/30ML LIQD Take 1 capsule by mouth daily.    Marland Kitchen. MELATONIN ER PO Take 12 mg by mouth at bedtime.    . metoprolol tartrate (LOPRESSOR) 25 MG tablet TAKE 1/2 TABLET BY MOUTH 2 TIMES A DAY. 90 tablet 6  . potassium chloride SA (K-DUR,KLOR-CON) 20 MEQ tablet TAKE ONE TABLET BY MOUTH DAILY WHILE TAKING FUROSEMIDE. 30 tablet 0  . sertraline (ZOLOFT) 100 MG tablet Take 100 mg by mouth daily.    . traMADol (ULTRAM) 50 MG tablet Take by mouth every 6 (six) hours as needed.     No current facility-administered medications for this visit.      Past Surgical History:  Procedure Laterality Date  . BREAST EXCISIONAL BIOPSY Right 2001   negative  . BREAST EXCISIONAL BIOPSY Right 2002   negative     Allergies  Allergen Reactions  . Sulfa Antibiotics Rash  Family History  Problem Relation Age of Onset  . Breast cancer Sister 30  . Breast cancer Maternal Aunt 70  . Breast cancer Maternal Aunt 70  . Breast cancer Maternal Aunt 80  . Breast cancer Maternal Aunt 70  . Hypertension Mother      Social History [name] reports that she has never smoked. She has never used smokeless tobacco. [name] reports that she does not drink alcohol.   Review of Systems CONSTITUTIONAL: No weight loss, fever, chills, weakness or fatigue.  HEENT: Eyes: No visual loss, blurred vision, double vision or yellow sclerae.No hearing loss, sneezing, congestion, runny nose or sore throat.  SKIN: No rash or itching.  CARDIOVASCULAR: per hpi RESPIRATORY: No shortness of breath, cough or sputum.  GASTROINTESTINAL: No anorexia, nausea,  vomiting or diarrhea. No abdominal pain or blood.  GENITOURINARY: No burning on urination, no polyuria NEUROLOGICAL: No headache, dizziness, syncope, paralysis, ataxia, numbness or tingling in the extremities. No change in bowel or bladder control.  MUSCULOSKELETAL: No muscle, back pain, joint pain or stiffness.  LYMPHATICS: No enlarged nodes. No history of splenectomy.  PSYCHIATRIC: No history of depression or anxiety.  ENDOCRINOLOGIC: No reports of sweating, cold or heat intolerance. No polyuria or polydipsia.  Marland Kitchen   Physical Examination Vitals:   09/23/17 1539  BP: 110/64  Pulse: 66  SpO2: 97%   Vitals:   09/23/17 1539  Weight: 244 lb 12.8 oz (111 kg)  Height: 5' 4.5" (1.638 m)    Gen: resting comfortably, no acute distress HEENT: no scleral icterus, pupils equal round and reactive, no palptable cervical adenopathy,  CV: RRR, no m/r/g, no jvd Resp: Clear to auscultation bilaterally GI: abdomen is soft, non-tender, non-distended, normal bowel sounds, no hepatosplenomegaly MSK: extremities are warm, no edema.  Skin: warm, no rash Neuro:  no focal deficits Psych: appropriate affect   Diagnostic Studies 10/2015 echo Study Conclusions  - Left ventricle: The cavity size was normal. Wall thickness was increased in a pattern of mild LVH. Systolic function was normal. The estimated ejection fraction was in the range of 60% to 65%. Wall motion was normal; there were no regional wall motion abnormalities. Features are consistent with a pseudonormal left ventricular filling pattern, with concomitant abnormal relaxation and increased filling pressure (grade 2 diastolic dysfunction). - Aortic valve: Mildly calcified annulus. Trileaflet. There was very mild stenosis. Peak velocity (S): 200 cm/s. Mean gradient (S): 8 mm Hg. Valve area (VTI): 1.69 cm^2. Valve area (Vmax): 1.72 cm^2. Valve area (Vmean): 1.79 cm^2. - Mitral valve: Calcified annulus.    10/2015  Heart monitor  Telemetry tracings show sinus rhythm  Reported symptoms correlate with sinus rhythm and sinus tachycardia with occasional PVCs  No significant arrhythmias  EKG in clinic today shows SR, LAFB.       Assessment and Plan  1.Chest pain - recent symptoms, we will plan for a nuclear stress test to further evaluate.  - EKG today SR, LAFB, poor R wave progression    2. Palpitations -monitor with just occasonal PVCs. - overall doing well, continue to monitor  3. Leg edema - controlled, continue to monitor.      Antoine Poche, M.D.

## 2017-09-23 NOTE — Telephone Encounter (Signed)
Pre-cert Verification for the following procedure   EXERCISE NUCLEAR STRESS TEST- CHEST [facility]IN [facility]

## 2017-09-23 NOTE — Patient Instructions (Signed)
Medication Instructions:  Your physician recommends that you continue on your current medications as directed. Please refer to the Current Medication list given to you today.  Labwork: NONE  Testing/Procedures: Your physician has requested that you have en exercise stress myoview. For further information please visit [email or web address]. Please follow instruction sheet, as given.  Follow-Up: Your physician recommends that you schedule a follow-up appointment PENDING TEST RESULTS  Any Other Special Instructions Will Be Listed Below (If Applicable).  If you need a refill on your cardiac medications before your next appointment, please call your pharmacy. 

## 2017-09-25 ENCOUNTER — Encounter (HOSPITAL_BASED_OUTPATIENT_CLINIC_OR_DEPARTMENT_OTHER)
Admission: RE | Admit: 2017-09-25 | Discharge: 2017-09-25 | Disposition: A | Discharge status: Home / Self Care | Payer: BC Managed Care – PPO | Source: Ambulatory Visit | Attending: Cardiology | Admitting: Cardiology

## 2017-09-25 ENCOUNTER — Encounter (HOSPITAL_COMMUNITY)
Admission: RE | Admit: 2017-09-25 | Discharge: 2017-09-25 | Disposition: A | Discharge status: Home / Self Care | Payer: BC Managed Care – PPO | Source: Ambulatory Visit | Attending: Cardiology | Admitting: Cardiology

## 2017-09-25 DIAGNOSIS — R079 Chest pain, unspecified: Secondary | ICD-10-CM

## 2017-09-25 LAB — NM MYOCAR MULTI W/SPECT W/WALL MOTION / EF
CHL CUP MPHR: 158 {beats}/min
CHL CUP NUCLEAR SDS: 2
CHL CUP RESTING HR STRESS: 59 {beats}/min
CSEPEDS: 58 s
CSEPHR: 89 %
Estimated workload: 7 METS
Exercise duration (min): 5 min
LV sys vol: 29 mL
LVDIAVOL: 83 mL (ref 46–106)
Peak HR: 142 {beats}/min
RATE: 0.48
RPE: 15
SRS: 2
SSS: 4
TID: 1.81

## 2017-09-25 MED ORDER — REGADENOSON 0.4 MG/5ML IV SOLN
INTRAVENOUS | Status: AC
  Filled 2017-09-25: qty 5

## 2017-09-25 MED ORDER — TECHNETIUM TC 99M TETROFOSMIN IV KIT
10.0000 | PACK | Freq: Once | INTRAVENOUS | Status: AC | PRN
  Administered 2017-09-25: 10 via INTRAVENOUS

## 2017-09-25 MED ORDER — TECHNETIUM TC 99M TETROFOSMIN IV KIT
30.0000 | PACK | Freq: Once | INTRAVENOUS | Status: AC | PRN
  Administered 2017-09-25: 30 via INTRAVENOUS

## 2017-09-25 MED ORDER — SODIUM CHLORIDE 0.9% FLUSH
INTRAVENOUS | Status: AC
  Administered 2017-09-25: 10 mL via INTRAVENOUS
  Filled 2017-09-25: qty 10

## 2017-10-01 ENCOUNTER — Encounter: Payer: Self-pay | Admitting: Cardiology

## 2017-10-01 ENCOUNTER — Telehealth: Payer: Self-pay | Admitting: *Deleted

## 2017-10-01 NOTE — Telephone Encounter (Signed)
-----   Message from Antoine PocheJonathan F Branch, MD sent at 09/28/2017  1:22 PM EDT ----- Stress test looks good, no evidence of any blockages as the cause of her symptoms. She should f/u with pcp to discuss noncardiac causes. F/u with us 1 month to reevalaute symptoms   J BrancH MD

## 2017-10-01 NOTE — Telephone Encounter (Signed)
Pt aware and voice understanding - routed to pcp - pt will call back to schedule 1 month f/u

## 2017-10-08 ENCOUNTER — Other Ambulatory Visit: Payer: Self-pay | Admitting: Cardiology

## 2017-10-11 NOTE — Procedures (Signed)
[facility] Bristyn Kulesza A. Gerilyn Pilgrimoonquah, MD     [email or web address]        NOCTURNAL POLYSOMNOGRAM    LOCATION: SLEEP LAB FACILITY: [facility]   PHYSICIAN: Filimon Miranda A. Gerilyn Pilgrimoonquah, M.D.   DATE OF STUDY: 09/23/2017    INDICATIONS: Fatigued and witnessed apnea  MEDICATIONS:  Prior to Admission medications   Medication Sig Start Date End Date Taking? Authorizing Provider  aspirin 325 MG tablet Take 325 mg by mouth daily.    [provider]  Biotin 10 MG CAPS Take 1 capsule by mouth daily.    [provider]  CALCIUM-VITAMIN D PO Take 1 tablet by mouth daily. Calcium w/ Vitamin D3 5,000 units    [provider]  clobetasol (TEMOVATE) 0.05 % external solution Apply 1 application topically as needed.     [provider]  diclofenac (VOLTAREN) 75 MG EC tablet Take 1 tablet (75 mg total) by mouth 2 (two) times daily. For muscle and  Joint pain 01/28/16   Mechele ClaudeStacks, Warren, MD  docusate sodium (COLACE) 100 MG capsule Take 100 mg by mouth at bedtime as needed for mild constipation.    [provider]  ferrous sulfate 325 (65 FE) MG EC tablet Take 325 mg by mouth daily.    [provider]  furosemide (LASIX) 20 MG tablet TAKE ONE TABLET BY MOUTH DAILY AS NEEDED. 10/09/17   Antoine PocheBranch, Jonathan F, MD  gabapentin (NEURONTIN) 300 MG capsule Take 300 mg by mouth 3 (three) times daily.     [provider]  Glucosamine-Chondroitin 1500-1200 MG/30ML LIQD Take 1 capsule by mouth daily.    [provider]  MAGNESIUM GLUCONATE PO Take 1 tablet by mouth daily.    [provider]  MELATONIN ER PO Take 12 mg by mouth at bedtime.    [provider]  metoprolol tartrate (LOPRESSOR) 25 MG tablet TAKE 1/2 TABLET BY MOUTH 2 TIMES A DAY. 06/26/17   Antoine PocheBranch, Jonathan F, MD  nitroGLYCERIN (NITROSTAT) 0.4 MG SL tablet Place 1 tablet (0.4 mg total) under the tongue every 5 (five) minutes as needed for chest pain. 09/23/17 12/22/17  Antoine PocheBranch,  Jonathan F, MD  Omega-3 Fatty Acids (FISH OIL PO) Take 1 capsule by mouth daily.    [provider]  potassium chloride SA (K-DUR,KLOR-CON) 20 MEQ tablet TAKE ONE TABLET BY MOUTH DAILY WHILE TAKING FUROSEMIDE. 07/01/17   Antoine PocheBranch, Jonathan F, MD  sertraline (ZOLOFT) 100 MG tablet Take 100 mg by mouth daily.    [provider]  traMADol (ULTRAM) 50 MG tablet Take by mouth every 6 (six) hours as needed.    [provider]      DATA:  this is a split night study with the initial study being a diagnostic and the 2nd portion a titration study.  Total recording time and the diagnostic is 250 minutes and the treatment portion 143 minutes.  Diagnostic architecture data:  N 16%, N2 78%, N3 0% and REM sleep 5.5%.Titration data:  N1 3.4%, N2 27.4% N3 0% and REM sleep 69%.  The diagnostic AHI is 22. Low his oxygen saturation is 65%.  The patient was titrated between 5 and 12%.  Ten works best.  IMPRESSION:  1.  Moderate obstructive sleep apnea syndrome is documented.  Titration study done showed the optimal pressure CPAP of 10.  Thanks for this referral.  Logyn Dedominicis A. Gerilyn Pilgrimoonquah, M.D. Diplomat, Biomedical engineerAmerican Board of Sleep Medicine.  ELECTRONICALLY SIGNED ON:  10/11/2017, 3:41 PM [facility] PH: [phone]   FX: [phone] ACCREDITED BY THE AMERICAN ACADEMY OF SLEEP MEDICINE

## 2017-10-20 ENCOUNTER — Ambulatory Visit: Payer: Self-pay | Admitting: Physician Assistant

## 2017-11-02 ENCOUNTER — Telehealth: Payer: Self-pay | Admitting: Cardiology

## 2017-11-02 NOTE — Telephone Encounter (Signed)
I had not ordered the study and thus the results have not been sent to me. From what I can tell they have Dr Juanetta GoslingHawkins name as the ordering provider, and I don't see a result for the study. Had she seen Dr Juanetta GoslingHawkins recently, she would need to touch base with his office   Dominga FerryJ Demarqus Jocson MD

## 2017-11-02 NOTE — Telephone Encounter (Signed)
Results of sleep study / tg  °

## 2017-11-03 NOTE — Telephone Encounter (Signed)
Patient informed via vm. 

## 2017-11-11 ENCOUNTER — Other Ambulatory Visit (HOSPITAL_COMMUNITY): Payer: Self-pay | Admitting: Adult Health Nurse Practitioner

## 2017-11-11 ENCOUNTER — Ambulatory Visit (HOSPITAL_COMMUNITY)
Admission: RE | Admit: 2017-11-11 | Discharge: 2017-11-11 | Disposition: A | Discharge status: Home / Self Care | Payer: BC Managed Care – PPO | Source: Ambulatory Visit | Attending: Adult Health Nurse Practitioner | Admitting: Adult Health Nurse Practitioner

## 2017-11-11 DIAGNOSIS — M25532 Pain in left wrist: Secondary | ICD-10-CM | POA: Diagnosis not present

## 2017-12-11 IMAGING — DX DG KNEE 1-2V*L*
2 series · 2 of 2 positions shown · non-contrast
Comparison: None.

CLINICAL DATA: Patient tripped over a rolling back in parking lot.
Contusions to left knee and back pain.

EXAM:
LEFT KNEE - 1-2 VIEW

[knee ap]
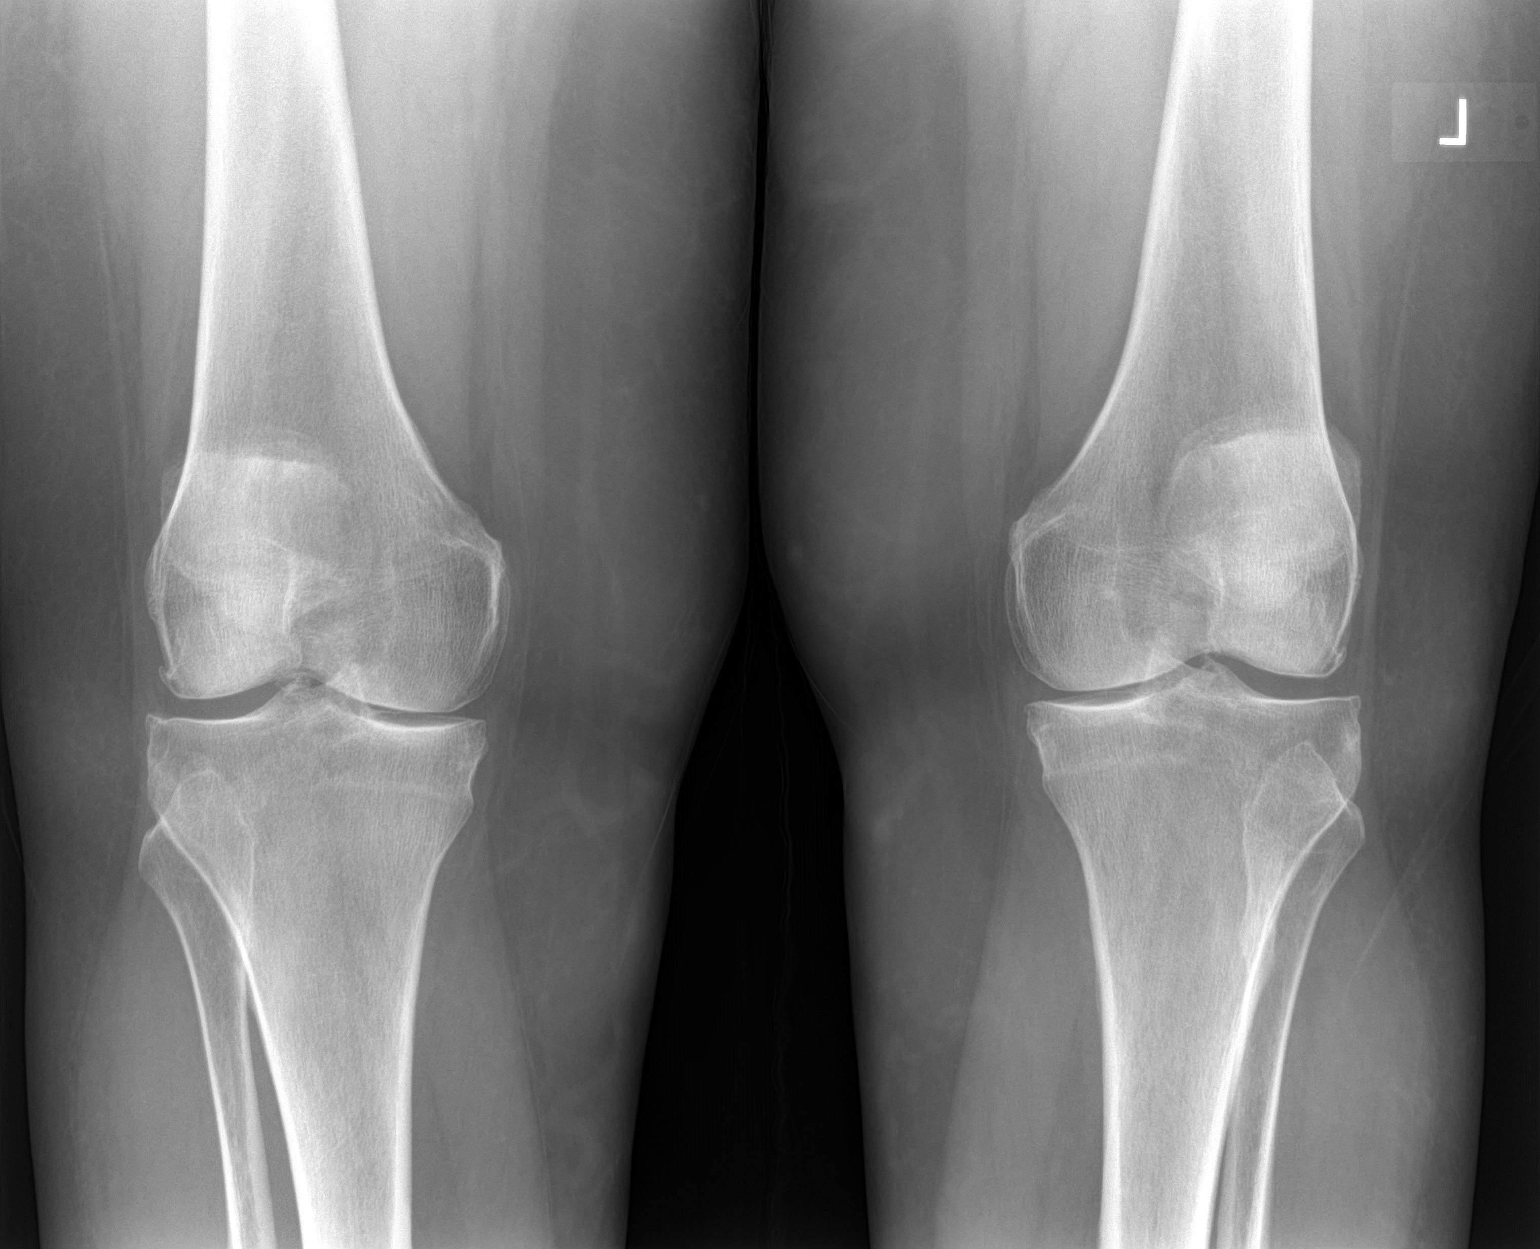

[knee lat]
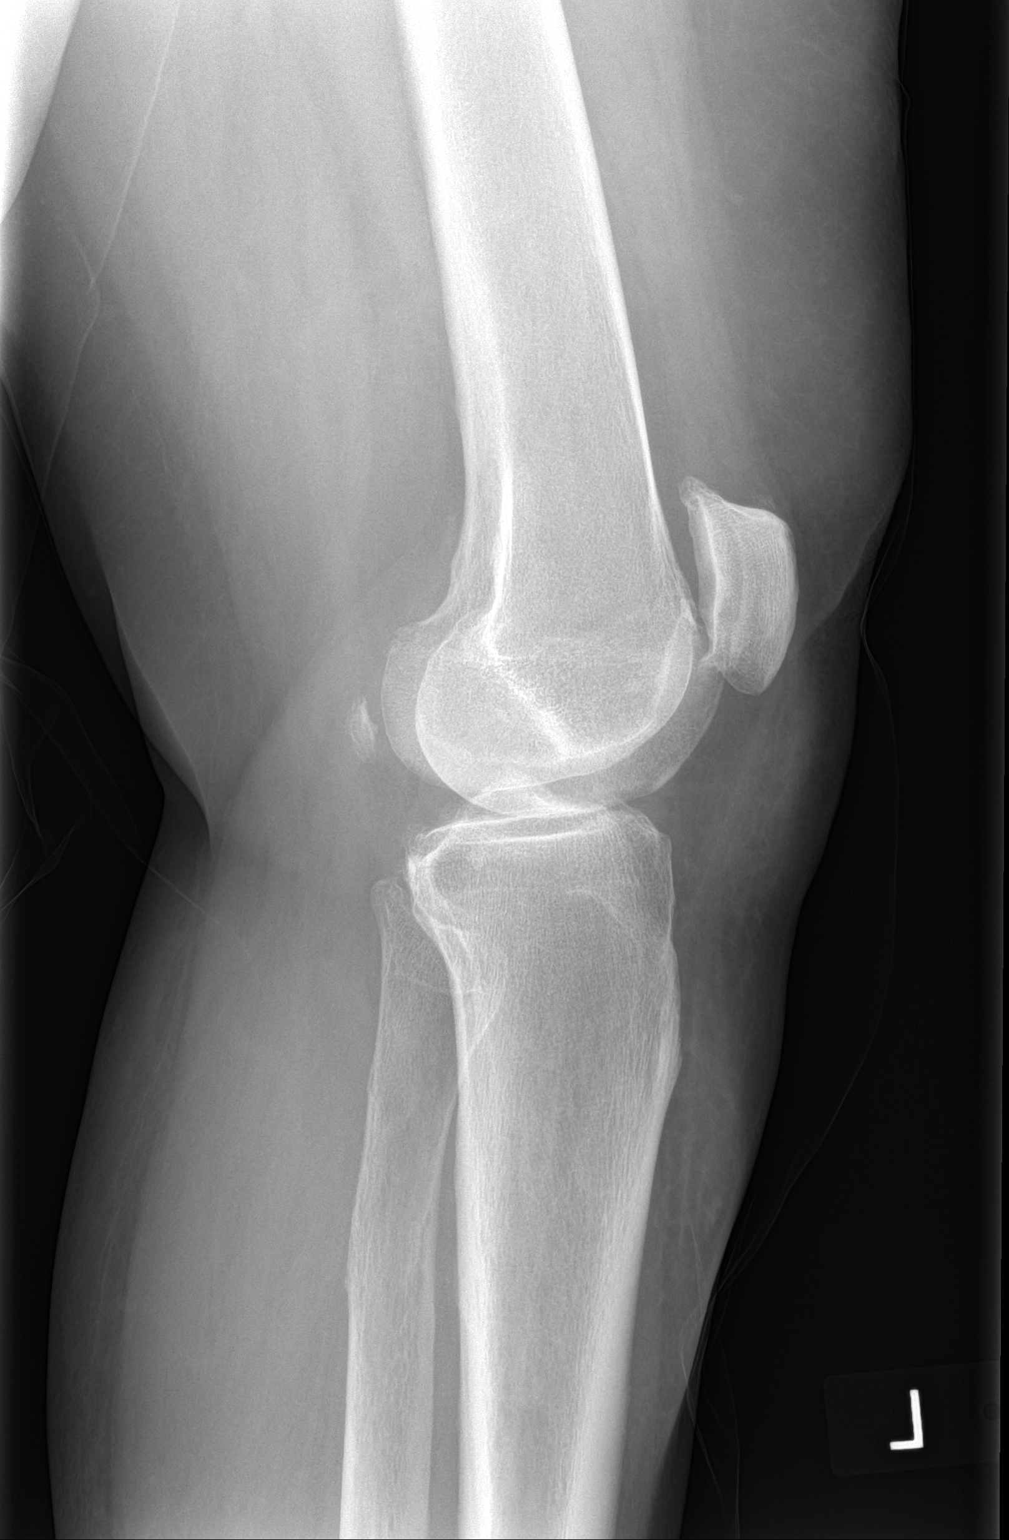

[2 of 2 positions shown; findings below may reference images not displayed]

FINDINGS: Two-view exam left knee shows no fracture. Loss of joint space noted
medial compartment. Hypertrophic spurring visible in all 3
compartments. No joint effusion.
IMPRESSION: No acute findings.

Tricompartmental degenerative change.

## 2017-12-11 IMAGING — DX DG HAND COMPLETE 3+V*R*
3 series · 3 of 3 positions shown · non-contrast
Comparison: None.

CLINICAL DATA: Fall.  Multiple contusions.

EXAM:
RIGHT HAND - COMPLETE 3+ VIEW

[hand pa]
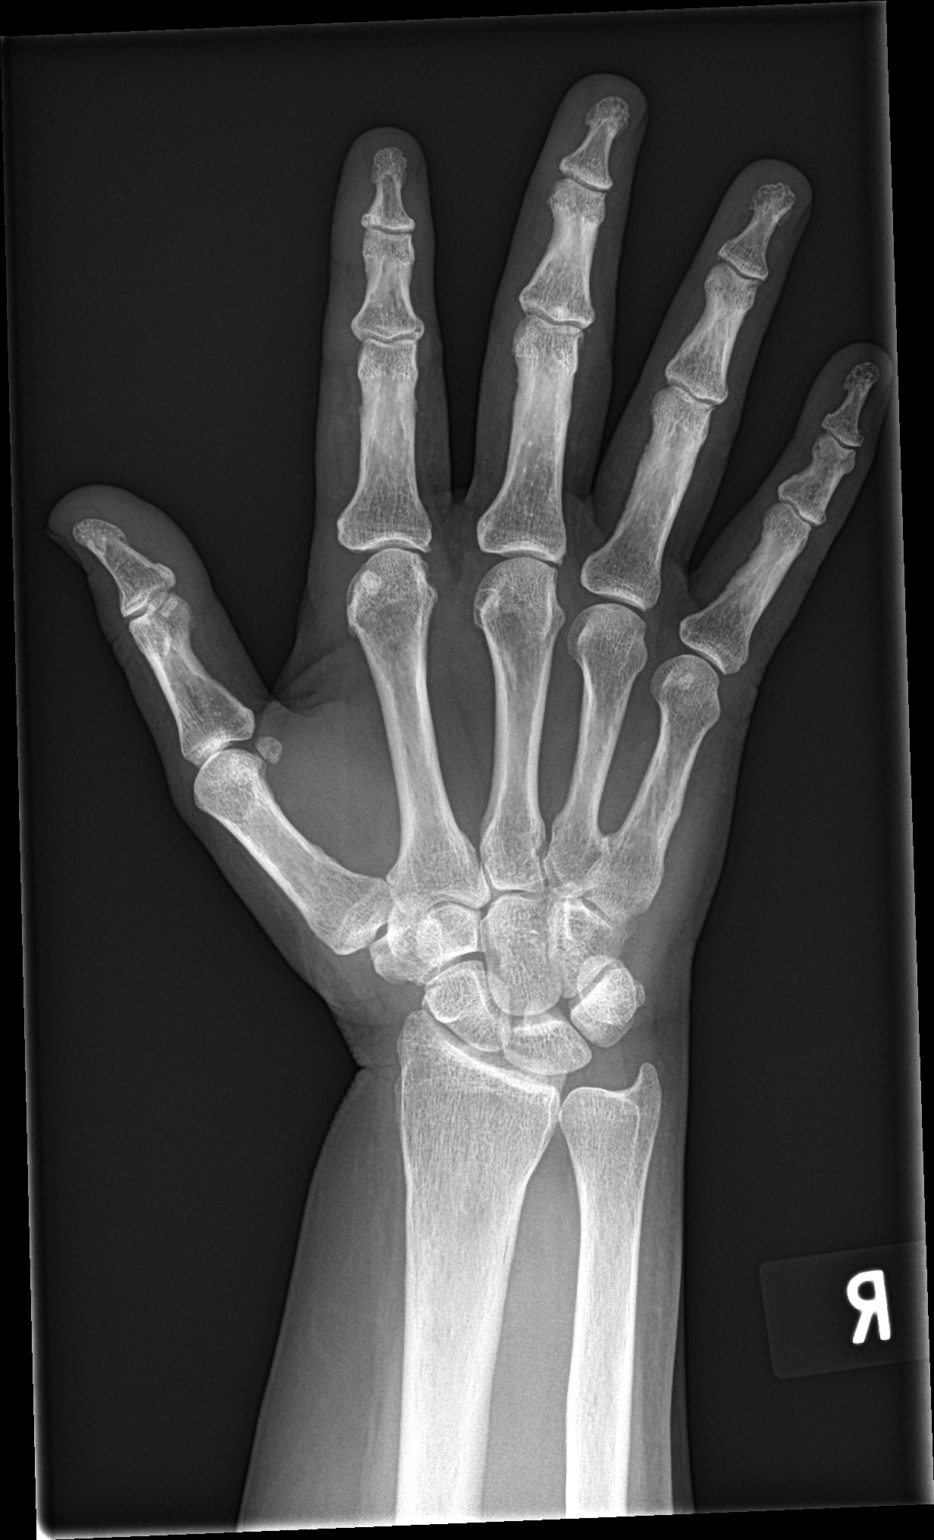

[hand obl]
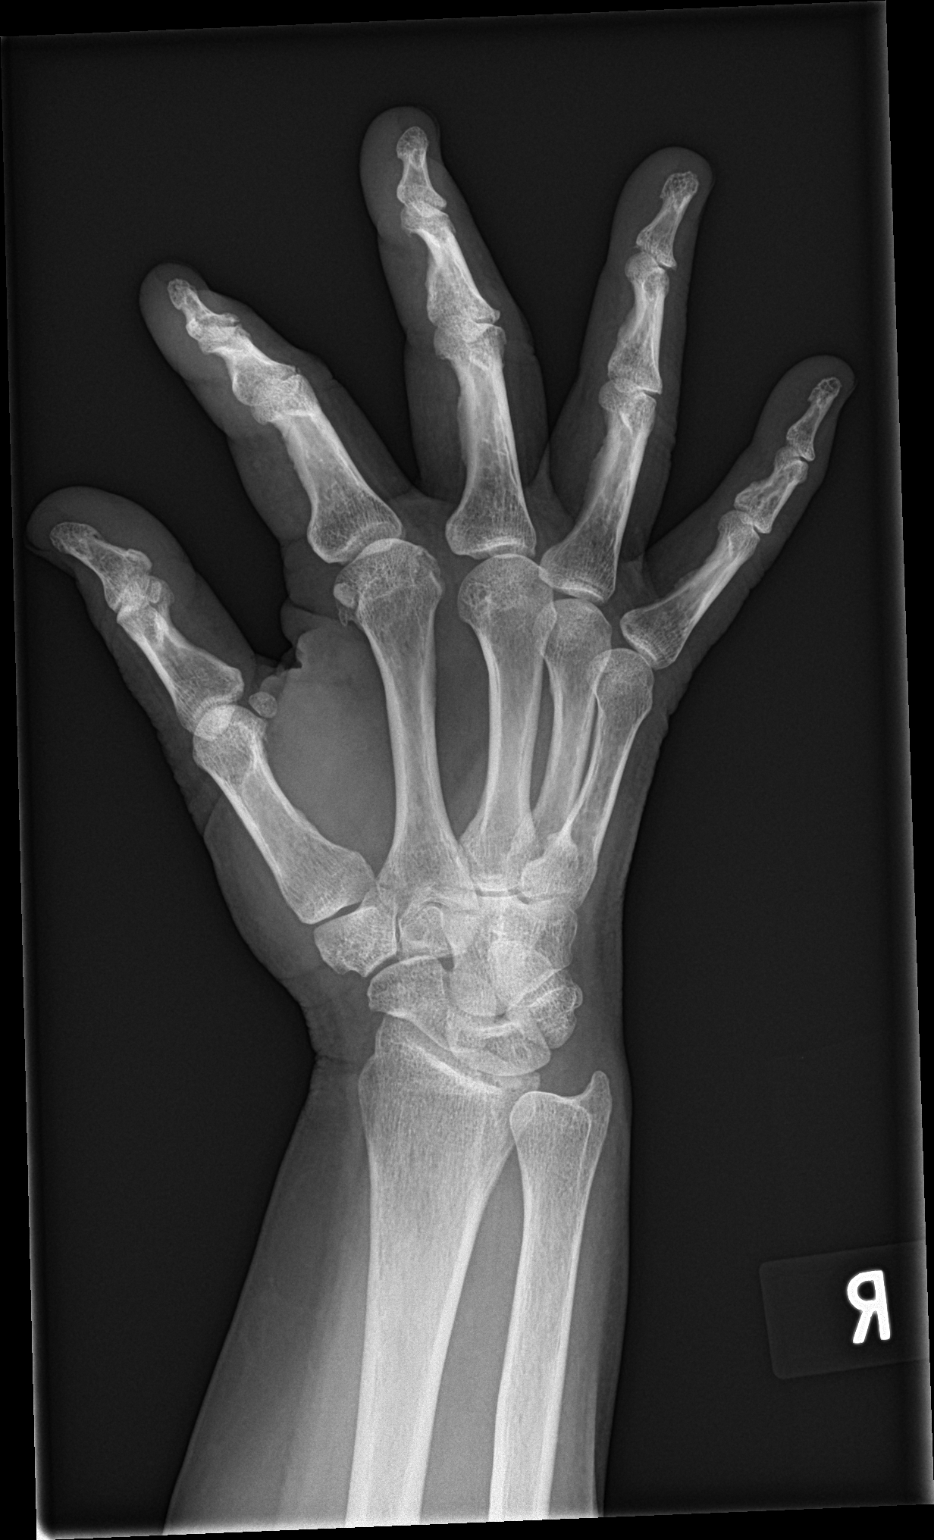

[hand lat]
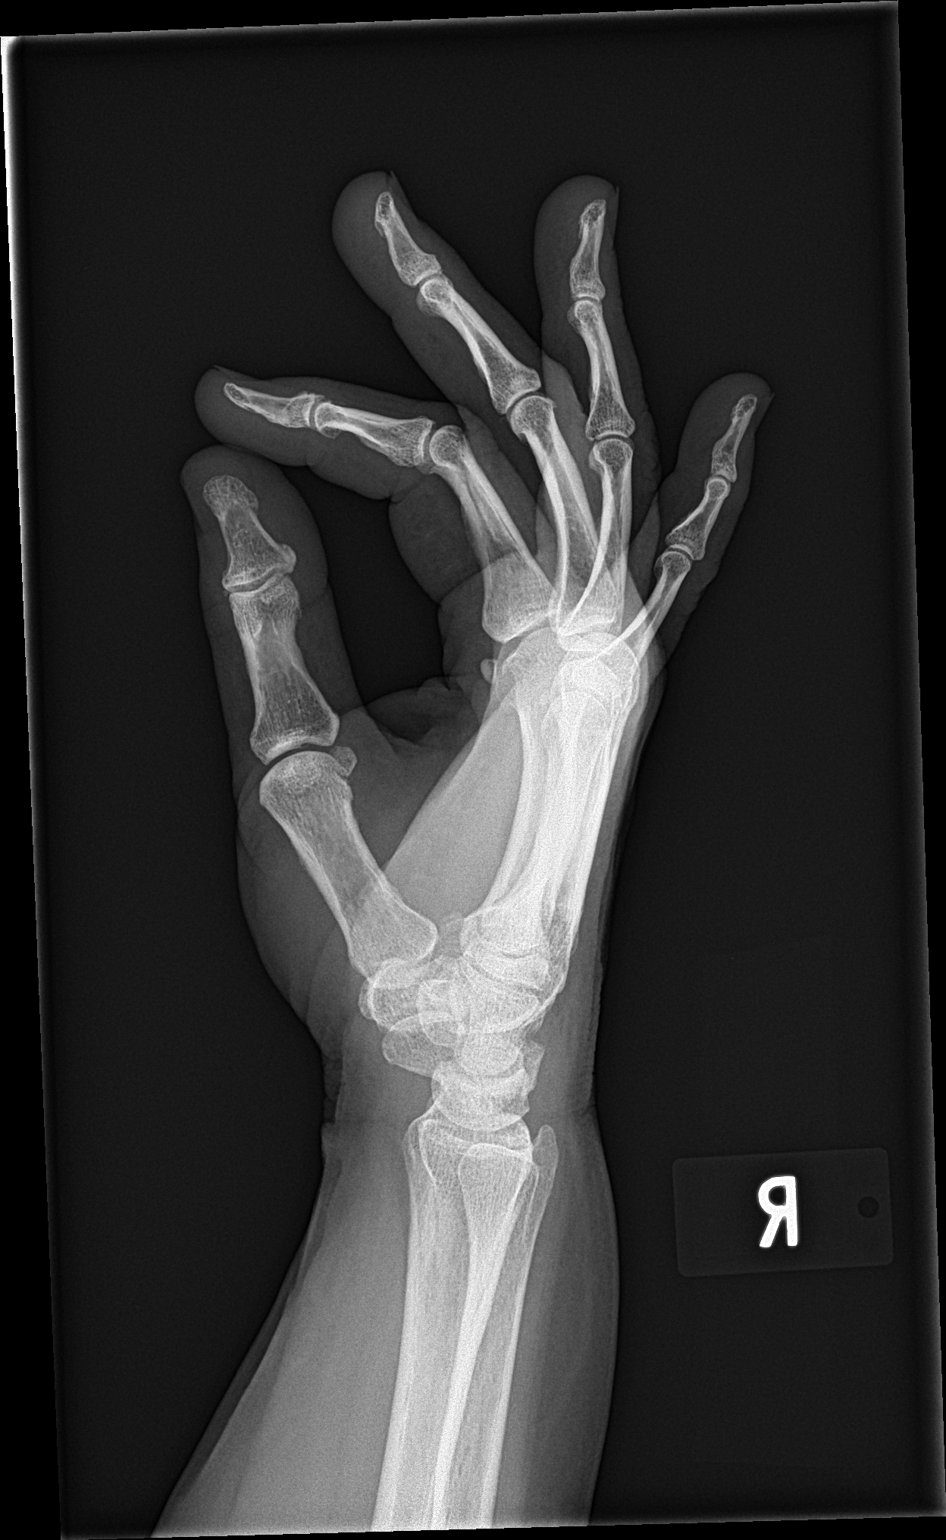

[3 of 3 positions shown; findings below may reference images not displayed]

FINDINGS: No fracture, dislocation or suspicious focal osseous lesion. Mild
osteoarthritis in the second and third metacarpophalangeal joints.
Mild osteoarthritis in the distal interphalangeal joint of the right
second finger. No radiopaque foreign body.
IMPRESSION: No fracture or malalignment.  Mild polyarticular osteoarthritis.

## 2018-01-21 ENCOUNTER — Other Ambulatory Visit: Payer: Self-pay | Admitting: Cardiology

## 2018-06-08 ENCOUNTER — Telehealth: Payer: Self-pay | Admitting: Cardiology

## 2018-06-08 NOTE — Telephone Encounter (Signed)
Patient wonders if her meds need to be changed. She took a whole tablet 25 mg of lopressor last night but did not notice any change in BP, HR in the 80's.Has f/u apt this month with [place]

## 2018-06-08 NOTE — Telephone Encounter (Signed)
Pt is wanting to speak w/ someone concerning her Blood pressure. Her reading this morning was 168/80 and last night 176/83. Last week it was in the 160's

## 2018-06-09 MED ORDER — AMLODIPINE BESYLATE 5 MG PO TABS: 5.0000 mg | ORAL_TABLET | Freq: Every day | ORAL | 11 refills | Status: DC

## 2018-06-09 NOTE — Telephone Encounter (Signed)
The lopressor is more for her palpitations, its only a fairly mild bp pill. This is the first time I recall her having issues with her blood pressures. If consistently that high we would need add an additional bp pill for her, I don't think lopressor higher dose will bring her bp down. Start norvac 5mg  daily, reassess with her appt with Chucky May MD

## 2018-06-09 NOTE — Telephone Encounter (Signed)
Pt notified and voiced understanding 

## 2018-06-23 ENCOUNTER — Telehealth: Payer: Self-pay | Admitting: Student

## 2018-06-23 MED ORDER — POTASSIUM CHLORIDE CRYS ER 20 MEQ PO TBCR: 20.0000 meq | EXTENDED_RELEASE_TABLET | Freq: Every day | ORAL | 3 refills | Status: DC | PRN

## 2018-06-23 MED ORDER — FUROSEMIDE 20 MG PO TABS: 20.0000 mg | ORAL_TABLET | Freq: Every day | ORAL | 3 refills | Status: DC | PRN

## 2018-06-23 NOTE — Telephone Encounter (Signed)
    Attempted to reach patient to see if she needs to keep her scheduled appointment for 3/19 or if this can he rescheduled. LVM to call back. Appears appointment was overall for BP control. If no current symptoms, can likely adjust medications over the phone unless she has additional concerns.   Signed, Ellsworth Lennox, PA-C 06/23/2018, 3:44 PM Pager: [phone]

## 2018-06-23 NOTE — Telephone Encounter (Signed)
Returned your call- she left a lengthy voicemail, but her phone cut out through the entire message. Requested a call back.

## 2018-06-23 NOTE — Telephone Encounter (Signed)
       Cardiac Questionnaire:    Since your last visit or hospitalization:    1. Have you been having new or worsening chest pain? No   2. Have you been having new or worsening shortness of breath? No 3. Have you been having new or worsening leg swelling, wt gain, or increase in abdominal girth (pants fitting more tightly)? No   4. Have you had any passing out spells? No    *A YES to any of these questions would result in the appointment being kept. *If all the answers to these questions are NO, we should indicate that given the current situation regarding the worldwide coronarvirus pandemic, at the recommendation of the CDC, we are looking to limit gatherings in our waiting area, and thus will reschedule their appointment beyond four weeks from today.   _____________    Did discuss BP with the patient as this was the reason for her visit. Readings have improved and SBP is typically in the 130's now. No side effects to the Amlodipine. Encouraged her to continue to follow BP at home and report back if this remained elevated as Amlodipine could be further titrated if needed.   Can cancel visit for 3/19 and reschedule in 4-6 weeks.   Signed, Ellsworth Lennox, PA-C 06/23/2018, 5:11 PM Pager: [phone]

## 2018-06-24 ENCOUNTER — Ambulatory Visit: Payer: Self-pay | Admitting: Student

## 2018-07-13 ENCOUNTER — Other Ambulatory Visit: Payer: Self-pay | Admitting: Cardiology

## 2018-08-03 ENCOUNTER — Telehealth: Payer: Self-pay | Admitting: Student

## 2018-08-03 NOTE — Telephone Encounter (Signed)
Virtual Visit Pre-Appointment Phone Call  "(Name), I am calling you today to discuss your upcoming appointment. We are currently trying to limit exposure to the virus that causes COVID-19 by seeing patients at home rather than in the office."  1. "What is the BEST phone number to call the day of the visit?" - include this in appointment notes  2. Do you have or have access to (through a family member/friend) a smartphone with video capability that we can use for your visit?" a. If yes - list this number in appt notes as cell (if different from BEST phone #) and list the appointment type as a VIDEO visit in appointment notes b. If no - list the appointment type as a PHONE visit in appointment notes  3. Confirm consent - "In the setting of the current Covid19 crisis, you are scheduled for a (phone or video) visit with your provider on (date) at (time).  Just as we do with many in-office visits, in order for you to participate in this visit, we must obtain consent.  If you'd like, I can send this to your mychart (if signed up) or email for you to review.  Otherwise, I can obtain your verbal consent now.  All virtual visits are billed to your insurance company just like a normal visit would be.  By agreeing to a virtual visit, we'd like you to understand that the technology does not allow for your provider to perform an examination, and thus may limit your provider's ability to fully assess your condition. If your provider identifies any concerns that need to be evaluated in person, we will make arrangements to do so.  Finally, though the technology is pretty good, we cannot assure that it will always work on either your or our end, and in the setting of a video visit, we may have to convert it to a phone-only visit.  In either situation, we cannot ensure that we have a secure connection.  Are you willing to proceed?" STAFF: Did the patient verbally acknowledge consent to telehealth visit? Document  YES/NO here: Yes  4. Advise patient to be prepared - "Two hours prior to your appointment, go ahead and check your blood pressure, pulse, oxygen saturation, and your weight (if you have the equipment to check those) and write them all down. When your visit starts, your provider will ask you for this information. If you have an Apple Watch or Kardia device, please plan to have heart rate information ready on the day of your appointment. Please have a pen and paper handy nearby the day of the visit as well."  5. Give patient instructions for MyChart download to smartphone OR [email or web address] as below if video visit (depending on what platform provider is using)  6. Inform patient they will receive a phone call 15 minutes prior to their appointment time (may be from unknown caller ID) so they should be prepared to answer    TELEPHONE CALL NOTE  Virginia Marks has been deemed a candidate for a follow-up tele-health visit to limit community exposure during the Covid-19 pandemic. I spoke with the patient via phone to ensure availability of phone/video source, confirm preferred email & phone number, and discuss instructions and expectations.  I reminded Virginia Marks to be prepared with any vital sign and/or heart rhythm information that could potentially be obtained via home monitoring, at the time of her visit. I reminded Virginia Marks to expect a phone call prior to her visit.  Virginia Marks 08/03/2018 4:01 PM

## 2018-08-05 ENCOUNTER — Encounter: Payer: Self-pay | Admitting: Student

## 2018-08-05 ENCOUNTER — Telehealth (INDEPENDENT_AMBULATORY_CARE_PROVIDER_SITE_OTHER): Payer: BC Managed Care – PPO | Admitting: Student

## 2018-08-05 ENCOUNTER — Other Ambulatory Visit: Payer: Self-pay | Admitting: Internal Medicine

## 2018-08-05 VITALS — BP 134/60 | HR 57 | Wt 242.0 lb

## 2018-08-05 DIAGNOSIS — M542 Cervicalgia: Secondary | ICD-10-CM

## 2018-08-05 DIAGNOSIS — R002 Palpitations: Secondary | ICD-10-CM

## 2018-08-05 DIAGNOSIS — Z7189 Other specified counseling: Secondary | ICD-10-CM

## 2018-08-05 DIAGNOSIS — I10 Essential (primary) hypertension: Secondary | ICD-10-CM | POA: Diagnosis not present

## 2018-08-05 DIAGNOSIS — R1310 Dysphagia, unspecified: Secondary | ICD-10-CM

## 2018-08-05 DIAGNOSIS — R600 Localized edema: Secondary | ICD-10-CM

## 2018-08-05 DIAGNOSIS — G4733 Obstructive sleep apnea (adult) (pediatric): Secondary | ICD-10-CM

## 2018-08-05 MED ORDER — AMLODIPINE BESYLATE 5 MG PO TABS: 5.0000 mg | ORAL_TABLET | Freq: Every day | ORAL | 3 refills | Status: DC

## 2018-08-05 NOTE — Patient Instructions (Signed)
Medication Instructions:  Your physician recommends that you continue on your current medications as directed. Please refer to the Current Medication list given to you today.   Labwork: NONE   Testing/Procedures: NONE  Follow-Up: Your physician wants you to follow-up in: 6 Months with Dr. Branch. You will receive a reminder letter in the mail two months in advance. If you don't receive a letter, please call our office to schedule the follow-up appointment.   Any Other Special Instructions Will Be Listed Below (If Applicable).     If you need a refill on your cardiac medications before your next appointment, please call your pharmacy. Thank you for choosing [facility]!    

## 2018-08-05 NOTE — Progress Notes (Signed)
Virtual Visit via Telephone Note   This visit type was conducted due to national recommendations for restrictions regarding the COVID-19 Pandemic (e.g. social distancing) in an effort to limit this patient's exposure and mitigate transmission in our community.  Due to her co-morbid illnesses, this patient is at least at moderate risk for complications without adequate follow up.  This format is felt to be most appropriate for this patient at this time.  The patient did not have access to video technology/had technical difficulties with video requiring transitioning to audio format only (telephone).  All issues noted in this document were discussed and addressed.  No physical exam could be performed with this format.  Please refer to the patient's chart for her  consent to telehealth for [facility].   Evaluation Performed:  Follow-up visit  Date:  08/05/2018   ID:  Virginia Marks, DOB 11-18-1955, MRN [id number]  Patient Location: Home Provider Location: Home  PCP:  Benita Stabile, MD  Cardiologist:  Dina Rich, MD  Electrophysiologist:  None   Chief Complaint: Follow-Up Visit  History of Present Illness:    Virginia Marks is a 63 y.o. female with past medical history of HTN, palpitations, and OSA who presents for a telehealth visit.   She was last examined by Dr. Wyline Mood in 09/2017 and reported episodes of chest pain for the past two months which could occur at rest or with activity and a NST was recommended for further evaluation. This showed no evidence of ischemia or scar, overall being a low-risk study.   In the interim, she called the office in 06/2018 reporting elevated BP readings. Was continued on Lopressor with Amlodipine 5mg  daily being added to her medication regimen.    In talking with the patient today, she reports doing well from a cardiac perspective since her last office visit. 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 Says she has been able to increase her activity due to her flexible schedule and is walking 1-2 times per day without any anginal symptoms. Was previously having severe back pain which was limiting this as well but symptoms continue to improve. Denies any recent chest pain or palpitations. No orthopnea, PND, dizziness, or presyncope. Does have occasional lower extremity edema which she utilizes Lasix for.   Reports good compliance with her CPAP. Does not check her BP regularly but it was well-controlled at 134/60 when checked prior to her visit.   The patient does not have symptoms concerning for COVID-19 infection (fever, chills, cough, or new shortness of breath).    Past Medical History:  Diagnosis Date  . Hyperlipidemia   . Hypertension   . Psoriasis    SCALP  . Spinal stenosis    Past Surgical History:  Procedure Laterality Date  . BREAST EXCISIONAL BIOPSY Right 2001   negative  . BREAST EXCISIONAL BIOPSY Right 2002   negative     Current Meds  Medication Sig  . amLODipine (NORVASC) 5 MG tablet Take 1 tablet (5 mg total) by mouth daily.  . Ascorbic Acid (VITA-C PO) Take by mouth.  Marland Kitchen aspirin 325 MG tablet Take 325 mg by mouth daily.  . Biotin 10 MG CAPS Take 1 capsule by mouth daily.  Marland Kitchen CALCIUM-VITAMIN D PO Take 1 tablet by mouth daily. Calcium w/ Vitamin D3 5,000 units  . clobetasol (TEMOVATE) 0.05 % external solution Apply 1 application topically as needed.   . diclofenac (VOLTAREN) 75  MG EC tablet Take 1 tablet (75 mg total) by mouth 2 (two) times daily. For muscle and  Joint pain  . docusate sodium (COLACE) 100 MG capsule Take 100 mg by mouth at bedtime as needed for mild constipation.  . ferrous sulfate 325 (65 FE) MG EC tablet Take 325 mg by mouth daily.  . furosemide (LASIX) 20 MG tablet Take 1 tablet (20 mg total) by mouth daily as needed. (Patient taking differently: Take 20 mg by mouth daily as needed (Takes another 20 mg PRN for swelling). )  .  gabapentin (NEURONTIN) 300 MG capsule Take 300 mg by mouth 3 (three) times daily.   . Glucosamine-Chondroitin 1500-1200 MG/30ML LIQD Take 1 capsule by mouth daily.  . metoprolol tartrate (LOPRESSOR) 25 MG tablet TAKE 1/2 TABLET BY MOUTH 2 TIMES A DAY.  . nitroGLYCERIN (NITROSTAT) 0.4 MG SL tablet Place 1 tablet (0.4 mg total) under the tongue every 5 (five) minutes as needed for chest pain.  . Omega-3 Fatty Acids (FISH OIL PO) Take 1 capsule by mouth daily.  . potassium chloride SA (K-DUR,KLOR-CON) 20 MEQ tablet Take 1 tablet (20 mEq total) by mouth daily as needed.  . sertraline (ZOLOFT) 50 MG tablet Take 50 mg by mouth daily.  Marland Kitchen. tiZANidine (ZANAFLEX) 4 MG tablet   . [DISCONTINUED] amLODipine (NORVASC) 5 MG tablet Take 1 tablet (5 mg total) by mouth daily.     Allergies:   Sulfa antibiotics   Social History   Tobacco Use  . Smoking status: Never Smoker  . Smokeless tobacco: Never Used  Substance Use Topics  . Alcohol use: No  . Drug use: Not on file     Family Hx: The patient's family history includes Breast cancer (age of onset: 10660) in her sister; Breast cancer (age of onset: 1770) in her maternal aunt, maternal aunt, and maternal aunt; Breast cancer (age of onset: 4380) in her maternal aunt; Hypertension in her mother.  ROS:   Please see the history of present illness.     All other systems reviewed and are negative.   Prior CV studies:   The following studies were reviewed today:  Echocardiogram: 10/2015 Study Conclusions  - Left ventricle: The cavity size was normal. Wall thickness was   increased in a pattern of mild LVH. Systolic function was normal.   The estimated ejection fraction was in the range of 60% to 65%.   Wall motion was normal; there were no regional wall motion   abnormalities. Features are consistent with a pseudonormal left   ventricular filling pattern, with concomitant abnormal relaxation   and increased filling pressure (grade 2 diastolic  dysfunction). - Aortic valve: Mildly calcified annulus. Trileaflet. There was   very mild stenosis. Peak velocity (S): 200 cm/s. Mean gradient   (S): 8 mm Hg. Valve area (VTI): 1.69 cm^2. Valve area (Vmax):   1.72 cm^2. Valve area (Vmean): 1.79 cm^2. - Mitral valve: Calcified annulus.  Event Monitor: 10/2015  Telemetry tracings show sinus rhythm  Reported symptoms correlate with sinus rhythm and sinus tachycardia with occasional PVCs  No significant arrhythmias   NST: 09/2017  Blood pressure demonstrated a hypertensive response to exercise.  There was no ST segment deviation noted during stress. Diffuse nonspecific T wave abnormalities seen throughout study.  The study is normal. No myocardial ischemia or scar. Rest images affected by artifact.  This is a low risk study.  Nuclear stress EF: 66%.  Labs/Other Tests and Data Reviewed:    EKG:  No  ECG reviewed.  Recent Labs: No results found for requested labs within last 8760 hours.   Recent Lipid Panel No results found for: CHOL, TRIG, HDL, CHOLHDL, LDLCALC, LDLDIRECT  Wt Readings from Last 3 Encounters:  08/05/18 242 lb (109.8 kg)  09/23/17 244 lb 12.8 oz (111 kg)  06/26/17 242 lb 6.4 oz (110 kg)     Objective:    Vital Signs:  BP 134/60   Pulse (!) 57   Wt 242 lb (109.8 kg)   BMI 40.90 kg/m    General: Pleasant female sounding in NAD Psych: Normal affect. Neuro: Alert and oriented X 3.  Lungs:  Resp regular and unlabored while talking on the phone.  ASSESSMENT & PLAN:    1. HTN - she does not check BP regularly but this has improved with the addition of Amlodipine. BP was 134/60 when checked earlier today. Continue current medication regimen with Amlodipine  daily and Lopressor 12.5mg  BID.   2. Palpitations - prior monitoring has shown occasional PVC's as outlined above. She denies any recent symptoms. Limits caffeine intake.  - continue Lopressor 12.5mg  BID. Would not further titrate at this time  given resting HR in the high-50's.   3. Lower Extremity Edema - reports symptoms have been at baseline. Weight has declined by 2 lbs since her last visit. She continues to take Lasix  as needed for edema and takes K+ supplementation with this.   4. OSA - followed by Dr. Juanetta Gosling. Continued compliance with CPAP encouraged.   5. COVID-19 Education - The signs and symptoms of COVID-19 were discussed with the patient. The importance of social distancing was discussed today.  Time:   Today, I have spent 23 minutes with the patient with telehealth technology discussing the above problems.     Medication Adjustments/Labs and Tests Ordered: Current medicines are reviewed at length with the patient today.  Concerns regarding medicines are outlined above.   Tests Ordered: No orders of the defined types were placed in this encounter.   Medication Changes: Meds ordered this encounter  Medications  . amLODipine (NORVASC) 5 MG tablet    Sig: Take 1 tablet (5 mg total) by mouth daily.    Dispense:  90 tablet    Refill:  3    Order Specific Question:   Supervising Provider    Answer:   Lars Masson [[id number]]    Disposition:  Follow up with Dr. Wyline Mood in 6 months.   Signed, Ellsworth Lennox, PA-C  08/05/2018 12:53 PM    [facility]

## 2018-08-23 ENCOUNTER — Other Ambulatory Visit: Payer: Self-pay

## 2018-08-23 ENCOUNTER — Ambulatory Visit (HOSPITAL_COMMUNITY)
Admission: RE | Admit: 2018-08-23 | Discharge: 2018-08-23 | Disposition: A | Discharge status: Home / Self Care | Payer: BC Managed Care – PPO | Source: Ambulatory Visit | Attending: Internal Medicine | Admitting: Internal Medicine

## 2018-08-23 DIAGNOSIS — R1310 Dysphagia, unspecified: Secondary | ICD-10-CM

## 2018-12-23 NOTE — Progress Notes (Signed)
This visit type was conducted due to national recommendations for restrictions regarding the COVID-19 Pandemic (e.g. social distancing) in an effort to limit this patient's exposure and mitigate transmission in our community.  Due to her co-morbid illnesses, this patient is at least at moderate risk for complications without adequate follow up.  This format is felt to be most appropriate for this patient at this time.  The patient did not have access to video technology/had technical difficulties with video requiring transitioning to audio format only (telephone).  All issues noted in this document were discussed and addressed.  No physical exam could be performed with this format.  Please refer to the patient's chart for her  consent to telehealth for [facility].    Cardiology Video  Office Note   Date:  12/24/2018   ID:  Virginia Marks, DOB 1955/09/20, MRN [id number]  PCP:  Benita Stabile, MD  Cardiologist:  Dr. Wyline Mood    Chief Complaint  Patient presents with  . Shortness of Breath  . Hypertension      History of Present Illness: Virginia Marks is a 63 y.o. female who presents for SOB HTN  past medical history of HTN, palpitations, and OSA who presents for a telehealth visit.   She was last examined by Dr. Wyline Mood in 09/2017 and reported episodes of chest pain for the past two months which could occur at rest or with activity and a NST was recommended for further evaluation. This showed no evidence of ischemia or scar, overall being a low-risk study.   In the interim, she called the office in 06/2018 reporting elevated BP readings. Was continued on Lopressor with Amlodipine 5mg  daily being added to her medication regimen.    In talking with the patient today,  Was previously having severe back pain which was limiting this as well but symptoms continue to improve. On that visit no recent chest pain or palpitations. No orthopnea, PND, dizziness, or presyncope. Does have  occasional lower extremity edema which she utilizes Lasix for.   Reports good compliance with her CPAP. Does not check her BP regularly but it was well-controlled at 134/60 when checked prior to her visit.   Today on VIdeo visit she tells me that over last 3-4 weeks she had increased BP and SOB.  Her BP was in 150s to 170s. She was seen by her PCP and ARB added and now BP controlled.  Her SOB has improved but still present.  She admits may be due to her wt and inactivity due to back pain.  Only chest pain she has had has been some time ago and she took NTG - she did have a nuc study as above that was normal.  Her back pain has increased and she is using a walker.  This helps with her pain.  She is to be seen at [facility] for eval.   Past Medical History:  Diagnosis Date  . Hyperlipidemia   . Hypertension   . Psoriasis    SCALP  . Spinal stenosis     Past Surgical History:  Procedure Laterality Date  . BREAST EXCISIONAL BIOPSY Right 2001   negative  . BREAST EXCISIONAL BIOPSY Right 2002   negative     Current Outpatient Medications  Medication Sig Dispense Refill  . amLODipine (NORVASC) 5 MG tablet Take 1 tablet (5 mg total) by mouth daily. 90 tablet 3  . Ascorbic Acid (VITA-C PO) Take by mouth.    Marland Kitchen aspirin 325 MG  tablet Take 325 mg by mouth daily.    . Biotin 10 MG CAPS Take 1 capsule by mouth daily.    Marland Kitchen. CALCIUM-VITAMIN D PO Take 1 tablet by mouth daily. Calcium w/ Vitamin D3 5,000 units    . clobetasol (TEMOVATE) 0.05 % external solution Apply 1 application topically as needed.     . diclofenac (VOLTAREN) 75 MG EC tablet Take 1 tablet (75 mg total) by mouth 2 (two) times daily. For muscle and  Joint pain 60 tablet 2  . docusate sodium (COLACE) 100 MG capsule Take 100 mg by mouth at bedtime as needed for mild constipation.    . DULoxetine (CYMBALTA) 60 MG capsule     . ferrous sulfate 325 (65 FE) MG EC tablet Take 325 mg by mouth daily.    . furosemide (LASIX) 20 MG tablet Take 20  mg by mouth daily.    Marland Kitchen. gabapentin (NEURONTIN) 300 MG capsule Take 300 mg by mouth 3 (three) times daily.     . Glucosamine-Chondroitin 1500-1200 MG/30ML LIQD Take 1 capsule by mouth daily.    Marland Kitchen. levothyroxine (SYNTHROID) 50 MCG tablet     . metoprolol tartrate (LOPRESSOR) 25 MG tablet TAKE 1/2 TABLET BY MOUTH 2 TIMES A DAY. 90 tablet 3  . nitroGLYCERIN (NITROSTAT) 0.4 MG SL tablet Place 1 tablet (0.4 mg total) under the tongue every 5 (five) minutes as needed for chest pain. 25 tablet 3  . olmesartan (BENICAR) 20 MG tablet     . Omega-3 Fatty Acids (FISH OIL PO) Take 1 capsule by mouth daily.    Marland Kitchen. omeprazole (PRILOSEC) 40 MG capsule Take 40 mg by mouth daily.     . potassium chloride SA (K-DUR,KLOR-CON) 20 MEQ tablet Take 1 tablet (20 mEq total) by mouth daily as needed. 90 tablet 3  . tiZANidine (ZANAFLEX) 4 MG tablet      No current facility-administered medications for this visit.     Allergies:   Sulfa antibiotics    Social History:  The patient  reports that she has never smoked. She has never used smokeless tobacco. She reports that she does not drink alcohol.   Family History:  The patient's family history includes Breast cancer (age of onset: 1460) in her sister; Breast cancer (age of onset: 2770) in her maternal aunt, maternal aunt, and maternal aunt; Breast cancer (age of onset: 2780) in her maternal aunt; Hypertension in her mother.    ROS:  General:no colds or fevers, no weight changes Skin:no rashes or ulcers HEENT:no blurred vision, no congestion CV:see HPI PUL:see HPI GI:no diarrhea constipation or melena, no indigestion GU:no hematuria, no dysuria MS:no joint pain, no claudication, + back pain and walking with a walker now Neuro:no syncope, no lightheadedness Endo:no diabetes, no thyroid disease  Wt Readings from Last 3 Encounters:  12/24/18 248 lb (112.5 kg)  08/05/18 242 lb (109.8 kg)  09/23/17 244 lb 12.8 oz (111 kg)     PHYSICAL EXAM: VS:  BP 129/67   Pulse 78    Ht 5' 4.5" (1.638 m)   Wt 248 lb (112.5 kg)   BMI 41.91 kg/m  , BMI Body mass index is 41.91 kg/m. General:Pleasant affect, NAD Lungs:some SOB with taking.   Neuro:alert and oriented X 3, MAE, follows commands    EKG:  EKG is NOT ordered today. The ekg from 09/24/18 SR with LAFB no acute changes.    Recent Labs: No results found for requested labs within last 8760 hours.  Lipid Panel No results found for: CHOL, TRIG, HDL, CHOLHDL, VLDL, LDLCALC, LDLDIRECT     Other studies Reviewed: Additional studies/ records that were reviewed today include: . Echo 10/29/15  Study Conclusions  - Left ventricle: The cavity size was normal. Wall thickness was   increased in a pattern of mild LVH. Systolic function was normal.   The estimated ejection fraction was in the range of 60% to 65%.   Wall motion was normal; there were no regional wall motion   abnormalities. Features are consistent with a pseudonormal left   ventricular filling pattern, with concomitant abnormal relaxation   and increased filling pressure (grade 2 diastolic dysfunction). - Aortic valve: Mildly calcified annulus. Trileaflet. There was   very mild stenosis. Peak velocity (S): 200 cm/s. Mean gradient   (S): 8 mm Hg. Valve area (VTI): 1.69 cm^2. Valve area (Vmax):   1.72 cm^2. Valve area (Vmean): 1.79 cm^2. - Mitral valve: Calcified annulus.   Nuc study 09/25/17  Blood pressure demonstrated a hypertensive response to exercise.  There was no ST segment deviation noted during stress. Diffuse nonspecific T wave abnormalities seen throughout study.  The study is normal. No myocardial ischemia or scar. Rest images affected by artifact.  This is a low risk study.  Nuclear stress EF: 66%.    ASSESSMENT AND PLAN:  1.  SOB, mostly with exertion.  Was increased with higher BP but has improved now BP better controlled.  She had stress test 1 year ago and no ischemia.  No chest pain.  Will do echo to eval LV  function.  If abnormal will decide if cath or stress test needed.  2.  HTN was uncontrolled now improved with addition of olmesartan 20 mg .  Continue along with BB amlodipine and lasix.   3.   OSA followed by Dr. Luan Pulling  4.  Back pain -Dr. Carloyn Manner retired and she will be seen at [facility], now walking with walker to help lighten back pain.   Current medicines are reviewed with the patient today.  The patient Has no concerns regarding medicines.  The following changes have been made:  See above Labs/ tests ordered today include:see above  Disposition:   FU:  see above  Signed, Cecilie Kicks, NP  12/24/2018 1:51 PM    [facility] [place], [place], [place], [place] Phone: [phone]; Fax: [phone]

## 2018-12-24 ENCOUNTER — Other Ambulatory Visit: Payer: Self-pay

## 2018-12-24 ENCOUNTER — Encounter: Payer: Self-pay | Admitting: Cardiology

## 2018-12-24 ENCOUNTER — Telehealth (INDEPENDENT_AMBULATORY_CARE_PROVIDER_SITE_OTHER): Payer: BC Managed Care – PPO | Admitting: Cardiology

## 2018-12-24 VITALS — BP 129/67 | HR 78 | Ht 64.5 in | Wt 248.0 lb

## 2018-12-24 DIAGNOSIS — R0602 Shortness of breath: Secondary | ICD-10-CM | POA: Diagnosis not present

## 2018-12-24 DIAGNOSIS — M549 Dorsalgia, unspecified: Secondary | ICD-10-CM

## 2018-12-24 DIAGNOSIS — G8929 Other chronic pain: Secondary | ICD-10-CM

## 2018-12-24 DIAGNOSIS — I10 Essential (primary) hypertension: Secondary | ICD-10-CM

## 2018-12-24 DIAGNOSIS — G4733 Obstructive sleep apnea (adult) (pediatric): Secondary | ICD-10-CM

## 2018-12-24 NOTE — Patient Instructions (Signed)
Medication Instructions:  Your physician recommends that you continue on your current medications as directed. Please refer to the Current Medication list given to you today.  If you need a refill on your cardiac medications before your next appointment, please call your pharmacy.   Lab work: NONE   If you have labs (blood work) drawn today and your tests are completely normal, you will receive your results only by: Marland Kitchen MyChart Message (if you have MyChart) OR . A paper copy in the mail If you have any lab test that is abnormal or we need to change your treatment, we will call you to review the results.  Testing/Procedures: Your physician has requested that you have an echocardiogram. Echocardiography is a painless test that uses sound waves to create images of your heart. It provides your doctor with information about the size and shape of your heart and how well your heart's chambers and valves are working. This procedure takes approximately one hour. There are no restrictions for this procedure.    Follow-Up: At [facility], you and your health needs are our priority.  As part of our continuing mission to provide you with exceptional heart care, we have created designated Provider Care Teams.  These Care Teams include your primary Cardiologist (physician) and Advanced Practice Providers (APPs -  Physician Assistants and Nurse Practitioners) who all work together to provide you with the care you need, when you need it. You will need a follow up appointment in 2-3 months.  Please call our office 2 months in advance to schedule this appointment.  You may see Carlyle Dolly, MD or one of the following Advanced Practice Providers on your designated Care Team:   Bernerd Pho, PA-C [facility]) . Ermalinda Barrios, PA-C ([place])  Any Other Special Instructions Will Be Listed Below (If Applicable). Thank you for choosing [facility]!

## 2018-12-24 NOTE — Addendum Note (Signed)
Addended by: Levonne Hubert on: 12/24/2018 04:07 PM   Modules accepted: Orders

## 2019-01-05 ENCOUNTER — Other Ambulatory Visit: Payer: Self-pay

## 2019-01-05 ENCOUNTER — Ambulatory Visit (HOSPITAL_COMMUNITY)
Admission: RE | Admit: 2019-01-05 | Discharge: 2019-01-05 | Disposition: A | Discharge status: Home / Self Care | Payer: BC Managed Care – PPO | Source: Ambulatory Visit | Attending: Cardiology | Admitting: Cardiology

## 2019-01-05 DIAGNOSIS — R0602 Shortness of breath: Secondary | ICD-10-CM | POA: Diagnosis present

## 2019-01-05 NOTE — Progress Notes (Signed)
*  PRELIMINARY RESULTS* Echocardiogram 2D Echocardiogram has been performed.  Leavy Cella 01/05/2019, 3:56 PM

## 2019-01-13 ENCOUNTER — Encounter (HOSPITAL_COMMUNITY): Payer: Self-pay

## 2019-01-13 ENCOUNTER — Emergency Department (HOSPITAL_COMMUNITY)
Admission: EM | Admit: 2019-01-13 | Discharge: 2019-01-14 | Disposition: A | Discharge status: Home / Self Care | Payer: BC Managed Care – PPO | Attending: Emergency Medicine | Admitting: Emergency Medicine

## 2019-01-13 ENCOUNTER — Other Ambulatory Visit: Payer: Self-pay

## 2019-01-13 DIAGNOSIS — R519 Headache, unspecified: Secondary | ICD-10-CM | POA: Diagnosis not present

## 2019-01-13 DIAGNOSIS — Z7982 Long term (current) use of aspirin: Secondary | ICD-10-CM | POA: Diagnosis not present

## 2019-01-13 DIAGNOSIS — Z79899 Other long term (current) drug therapy: Secondary | ICD-10-CM | POA: Insufficient documentation

## 2019-01-13 DIAGNOSIS — I10 Essential (primary) hypertension: Secondary | ICD-10-CM | POA: Diagnosis not present

## 2019-01-13 DIAGNOSIS — R609 Edema, unspecified: Secondary | ICD-10-CM | POA: Diagnosis present

## 2019-01-13 DIAGNOSIS — R600 Localized edema: Secondary | ICD-10-CM | POA: Diagnosis not present

## 2019-01-13 NOTE — ED Triage Notes (Signed)
Pt arrives from home via [facility] c/o persistent headache. Pt reports elevated blood pressure at home, and lower extremity swelling. This has been going on for past few  Months.

## 2019-01-14 ENCOUNTER — Emergency Department (HOSPITAL_COMMUNITY): Payer: BC Managed Care – PPO

## 2019-01-14 LAB — CBC WITH DIFFERENTIAL/PLATELET
Abs Immature Granulocytes: 0.03 10*3/uL (ref 0.00–0.07)
Basophils Absolute: 0 10*3/uL (ref 0.0–0.1)
Basophils Relative: 1 %
Eosinophils Absolute: 0.6 10*3/uL — ABNORMAL HIGH (ref 0.0–0.5)
Eosinophils Relative: 8 %
HCT: 37.9 % (ref 36.0–46.0)
Hemoglobin: 12.1 g/dL (ref 12.0–15.0)
Immature Granulocytes: 0 %
Lymphocytes Relative: 23 %
Lymphs Abs: 2 10*3/uL (ref 0.7–4.0)
MCH: 29.5 pg (ref 26.0–34.0)
MCHC: 31.9 g/dL (ref 30.0–36.0)
MCV: 92.4 fL (ref 80.0–100.0)
Monocytes Absolute: 0.6 10*3/uL (ref 0.1–1.0)
Monocytes Relative: 7 %
Neutro Abs: 5.2 10*3/uL (ref 1.7–7.7)
Neutrophils Relative %: 61 %
Platelets: 263 10*3/uL (ref 150–400)
RBC: 4.1 MIL/uL (ref 3.87–5.11)
RDW: 13.6 % (ref 11.5–15.5)
WBC: 8.5 10*3/uL (ref 4.0–10.5)
nRBC: 0 % (ref 0.0–0.2)

## 2019-01-14 LAB — URINALYSIS, ROUTINE W REFLEX MICROSCOPIC
Bilirubin Urine: NEGATIVE
Glucose, UA: NEGATIVE mg/dL
Hgb urine dipstick: NEGATIVE
Ketones, ur: NEGATIVE mg/dL
Leukocytes,Ua: NEGATIVE
Nitrite: NEGATIVE
Protein, ur: NEGATIVE mg/dL
Specific Gravity, Urine: 1.015 (ref 1.005–1.030)
pH: 6 (ref 5.0–8.0)

## 2019-01-14 LAB — COMPREHENSIVE METABOLIC PANEL
ALT: 38 U/L (ref 0–44)
AST: 25 U/L (ref 15–41)
Albumin: 3.4 g/dL — ABNORMAL LOW (ref 3.5–5.0)
Alkaline Phosphatase: 68 U/L (ref 38–126)
Anion gap: 5 (ref 5–15)
BUN: 18 mg/dL (ref 8–23)
CO2: 28 mmol/L (ref 22–32)
Calcium: 8.6 mg/dL — ABNORMAL LOW (ref 8.9–10.3)
Chloride: 106 mmol/L (ref 98–111)
Creatinine, Ser: 0.79 mg/dL (ref 0.44–1.00)
GFR calc Af Amer: >60 mL/min (ref >60)
GFR calc non Af Amer: >60 mL/min (ref >60)
Glucose, Bld: 203 mg/dL — ABNORMAL HIGH (ref 70–99)
Potassium: 3.9 mmol/L (ref 3.5–5.1)
Sodium: 139 mmol/L (ref 135–145)
Total Bilirubin: 0.3 mg/dL (ref 0.3–1.2)
Total Protein: 6.2 g/dL — ABNORMAL LOW (ref 6.5–8.1)

## 2019-01-14 LAB — TROPONIN I (HIGH SENSITIVITY)
Troponin I (High Sensitivity): 3 ng/L (ref <18)
Troponin I (High Sensitivity): 3 ng/L (ref <18)

## 2019-01-14 LAB — BRAIN NATRIURETIC PEPTIDE: B Natriuretic Peptide: 51 pg/mL (ref 0.0–100.0)

## 2019-01-14 MED ORDER — DIPHENHYDRAMINE HCL 50 MG/ML IJ SOLN
25.0000 mg | Freq: Once | INTRAMUSCULAR | Status: AC
  Administered 2019-01-14: 01:00:00 25 mg via INTRAVENOUS
  Filled 2019-01-14: qty 1

## 2019-01-14 MED ORDER — METOCLOPRAMIDE HCL 5 MG/ML IJ SOLN
10.0000 mg | Freq: Once | INTRAMUSCULAR | Status: AC
  Administered 2019-01-14: 01:00:00 10 mg via INTRAVENOUS
  Filled 2019-01-14: qty 2

## 2019-01-14 MED ORDER — FUROSEMIDE 10 MG/ML IJ SOLN
40.0000 mg | Freq: Once | INTRAMUSCULAR | Status: AC
  Administered 2019-01-14: 03:00:00 40 mg via INTRAVENOUS
  Filled 2019-01-14: qty 4

## 2019-01-14 NOTE — ED Notes (Signed)
ED Provider at bedside. 

## 2019-01-14 NOTE — Discharge Instructions (Addendum)
Please double your lasix on Saturday, Sunday and Monday. Follow up with dr. Harl Bowie or someone in his office nect week sometime for reeval. Don't take your lasix today, Friday, as you have already had a significant dose here in ED.

## 2019-01-14 NOTE — ED Provider Notes (Signed)
Emergency Department Provider Note   I have reviewed the triage vital signs and the nursing notes.   HISTORY  Chief Complaint Headache   HPI Virginia Marks is a 63 y.o. female Presents to the emergency department with multiple complaints.  Patient states that she has had slowly worsening bilateral lower extremity edema for least a couple weeks now with some weight gain over that time as well.  She recently started her thyroid medicine and increase the dose as well.  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  Tonight she noticed how swollen her legs were and then she started having a headache.  She took her blood pressure at those that it was high initially after that she started to have some chest pressure as well no shortness of breath, nausea, vomiting, diaphoresis or lightheadedness.  No radiation anywhere.  Patient presents here for these complaints.  No neurologic changes.   No other associated or modifying symptoms.    Past Medical History:  Diagnosis Date   Hyperlipidemia    Hypertension    Psoriasis    SCALP   Spinal stenosis     Patient Active Problem List   Diagnosis Date Noted   Contusion, knee and lower leg, left, initial encounter 02/05/2016   Contusion, forearm and elbow, right, initial encounter 02/05/2016   Groin strain 02/05/2016   Upper back strain 02/05/2016   Hyperlipidemia 10/24/2015   Benign essential HTN 10/24/2015   Psoriasis 10/24/2015   Spinal stenosis 10/24/2015    Past Surgical History:  Procedure Laterality Date   BREAST EXCISIONAL BIOPSY Right 2001   negative   BREAST EXCISIONAL BIOPSY Right 2002   negative    Current Outpatient Rx   Order #: [id number] Class: Normal   Order #: [id number] Class: Historical Med   Order #: [id number] Class: Historical Med   Order #: [id number] Class: Historical Med   Order #: [id number] Class: Historical Med   Order #: [id number] Class:  Historical Med   Order #: [id number] Class: Normal   Order #: [id number] Class: Historical Med   Order #: [id number] Class: Historical Med   Order #: [id number] Class: Historical Med   Order #: [id number] Class: Historical Med   Order #: [id number] Class: Historical Med   Order #: [id number] Class: Historical Med   Order #: [id number] Class: Historical Med   Order #: [id number] Class: Normal   Order #: [id number] Class: Normal   Order #: [id number] Class: Historical Med   Order #: [id number] Class: Historical Med   Order #: [id number] Class: Historical Med   Order #: [id number] Class: Normal   Order #: [id number] Class: Historical Med    Allergies Sulfa antibiotics  Family History  Problem Relation Age of Onset   Breast cancer Sister 76   Breast cancer Maternal Aunt 70   Breast cancer Maternal Aunt 70   Breast cancer Maternal Aunt 80   Breast cancer Maternal Aunt 21   Hypertension Mother     Social History Social History   Tobacco Use   Smoking status: Never Smoker   Smokeless tobacco: Never Used  Substance Use Topics   Alcohol use: No   Drug use: Never    Review of Systems  All other systems negative except as documented in the HPI. All pertinent positives and negatives as reviewed in the HPI. ____________________________________________   PHYSICAL EXAM:  VITAL SIGNS: ED Triage Vitals  Enc Vitals Group     BP 01/13/19 2338 (!) 145/74     Pulse Rate 01/13/19 2338 81  Resp 01/13/19 2338 17     Temp 01/13/19 2338 97.8 F (36.6 C)     Temp Source 01/13/19 2338 Oral     SpO2 01/13/19 2338 99 %     Weight 01/13/19 2339 252 lb (114.3 kg)     Height 01/13/19 2339 5' 4.5" (1.638 m)    Constitutional: Alert and oriented. Well appearing and in no acute distress. Eyes: Conjunctivae are normal. PERRL. EOMI. Head: Atraumatic. Nose: No congestion/rhinnorhea. Mouth/Throat: Mucous membranes are moist.  Oropharynx non-erythematous. Neck: No stridor.  No meningeal  signs.   Cardiovascular: Normal rate, regular rhythm. Good peripheral circulation. Grossly normal heart sounds.   Respiratory: Normal respiratory effort.  No retractions. Lungs CTAB. Gastrointestinal: Soft and nontender. No distention.  Musculoskeletal: No lower extremity tenderness but has 2+ edema BLE above knees. No gross deformities of extremities. Neurologic:  No altered mental status, able to give full seemingly accurate history.  Face is symmetric, EOM's intact, pupils equal and reactive, vision intact, tongue and uvula midline without deviation. Upper and Lower extremity motor 5/5, intact pain perception in distal extremities, 2+ reflexes in biceps, patella and achilles tendons. Skin:  Skin is warm, dry and intact. No rash noted.  ____________________________________________   LABS (all labs ordered are listed, but only abnormal results are displayed)  Labs Reviewed  CBC WITH DIFFERENTIAL/PLATELET - Abnormal; Notable for the following components:      Result Value   Eosinophils Absolute 0.6 (*)    All other components within normal limits  COMPREHENSIVE METABOLIC PANEL - Abnormal; Notable for the following components:   Glucose, Bld 203 (*)    Calcium 8.6 (*)    Total Protein 6.2 (*)    Albumin 3.4 (*)    All other components within normal limits  URINALYSIS, ROUTINE W REFLEX MICROSCOPIC  BRAIN NATRIURETIC PEPTIDE  TROPONIN I (HIGH SENSITIVITY)  TROPONIN I (HIGH SENSITIVITY)   ____________________________________________  EKG   EKG Interpretation  Date/Time:  Thursday January 13 2019 23:37:26 EDT Ventricular Rate:  80 PR Interval:    QRS Duration: 102 QT Interval:  393 QTC Calculation: 454 R Axis:   -34 Text Interpretation:  Sinus rhythm Left axis deviation Low voltage, precordial leads Consider anterior infarct No old tracing to compare Confirmed by Marily MemosMesner, Bernisha Verma [phone]([id number]) on 01/14/2019 1:28:41 AM     ____________________________________________  RADIOLOGY  Dg  Chest 2 View  Result Date: 01/14/2019 CLINICAL DATA:  Headache and hypertension, chest pain EXAM: CHEST - 2 VIEW COMPARISON:  07/12/2016 FINDINGS: The heart size and mediastinal contours are within normal limits. Both lungs are clear. The visualized skeletal structures are unremarkable. IMPRESSION: No active cardiopulmonary disease. Electronically Signed   By: Deatra RobinsonKevin  Herman M.D.   On: 01/14/2019 01:38   Ct Head Wo Contrast  Result Date: 01/14/2019 CLINICAL DATA:  Headache and high blood pressure EXAM: CT HEAD WITHOUT CONTRAST TECHNIQUE: Contiguous axial images were obtained from the base of the skull through the vertex without intravenous contrast. COMPARISON:  None. FINDINGS: Brain: There is no mass, hemorrhage or extra-axial collection. The size and configuration of the ventricles and extra-axial CSF spaces are normal. The brain parenchyma is normal, without acute or chronic infarction. Vascular: No abnormal hyperdensity of the major intracranial arteries or dural venous sinuses. No intracranial atherosclerosis. Skull: The visualized skull base, calvarium and extracranial soft tissues are normal. Sinuses/Orbits: No fluid levels or advanced mucosal thickening of the visualized paranasal sinuses. No mastoid or middle ear effusion. The orbits are normal. IMPRESSION: Normal head CT.  Electronically Signed   By: Ulyses Jarred M.D.   On: 01/14/2019 01:37    ____________________________________________   PROCEDURES  Procedure(s) performed:   Procedures   ____________________________________________   INITIAL IMPRESSION / ASSESSMENT AND PLAN / ED COURSE  Likely fluid overload with hypertension as cause for chest pressure and headache. Doubt acs at this time. Will check labs and initiate diuresis. reeval for disposition.  reeval with significant diuresis, improvement in chest symptoms (resolved) and significant improvement in headache. Now also complaining of leg cramping for last 1-1.5 weeks.  Lytes ok, renal fxn ok, can follow up with PCP for this.   Will reeval soon for likely discharge.   Persistent significant imprvement in symptoms, diuresed well. Will double up at home and follow up with cardiologist for same. One episode of hypoxia while sleeping but patient supposed to be on bipap so doubt it is from pulmonary edema or other significant issue requiring further workup.    Pertinent labs & imaging results that were available during my care of the patient were reviewed by me and considered in my medical decision making (see chart for details).  A medical screening exam was performed and I feel the patient has had an appropriate workup for their chief complaint at this time and likelihood of emergent condition existing is low. They have been counseled on decision, discharge, follow up and which symptoms necessitate immediate return to the emergency department. They or their family verbally stated understanding and agreement with plan and discharged in stable condition.   ____________________________________________  FINAL CLINICAL IMPRESSION(S) / ED DIAGNOSES  Final diagnoses:  Peripheral edema     MEDICATIONS GIVEN DURING THIS VISIT:  Medications  metoCLOPramide (REGLAN) injection 10 mg (10 mg Intravenous Given 01/14/19 0128)  diphenhydrAMINE (BENADRYL) injection 25 mg (25 mg Intravenous Given 01/14/19 0128)  furosemide (LASIX) injection 40 mg (40 mg Intravenous Given 01/14/19 0259)     NEW OUTPATIENT MEDICATIONS STARTED DURING THIS VISIT:  New Prescriptions   No medications on file    Note:  This note was prepared with assistance of Dragon voice recognition software. Occasional wrong-word or sound-a-like substitutions may have occurred due to the inherent limitations of voice recognition software.   Lashaunta Sicard, Corene Cornea, MD 01/14/19 [phone]

## 2019-01-14 NOTE — ED Notes (Signed)
Patient transported to CT 

## 2019-01-17 ENCOUNTER — Ambulatory Visit: Payer: BC Managed Care – PPO | Admitting: Physician Assistant

## 2019-01-17 ENCOUNTER — Other Ambulatory Visit: Payer: Self-pay

## 2019-01-17 ENCOUNTER — Encounter: Payer: Self-pay | Admitting: Physician Assistant

## 2019-01-17 VITALS — BP 111/62 | HR 67 | Temp 96.6°F | Ht 64.5 in | Wt 252.0 lb

## 2019-01-17 DIAGNOSIS — I5032 Chronic diastolic (congestive) heart failure: Secondary | ICD-10-CM

## 2019-01-17 DIAGNOSIS — R002 Palpitations: Secondary | ICD-10-CM

## 2019-01-17 DIAGNOSIS — F419 Anxiety disorder, unspecified: Secondary | ICD-10-CM

## 2019-01-17 DIAGNOSIS — I10 Essential (primary) hypertension: Secondary | ICD-10-CM | POA: Diagnosis not present

## 2019-01-17 DIAGNOSIS — Z87898 Personal history of other specified conditions: Secondary | ICD-10-CM | POA: Diagnosis not present

## 2019-01-17 MED ORDER — FUROSEMIDE 40 MG PO TABS: 40.0000 mg | ORAL_TABLET | Freq: Every day | ORAL | 3 refills | Status: DC

## 2019-01-17 NOTE — Patient Instructions (Signed)
Medication Instructions:  INCREASE LASIX 40 MG DAILY   Labwork: 2 WEEKS  BMET   Testing/Procedures: NONE  You have been [facility] to [facility] You have been referred to PSYCHOLOGIST     Follow-Up: Your physician recommends that you schedule a follow-up appointment in: 2 MONTHS      Any Other Special Instructions Will Be Listed Below (If Applicable). Two Gram Sodium Diet 2000 mg  What is Sodium? Sodium is a mineral found naturally in many foods. The most significant source of sodium in the diet is table salt, which is about 40% sodium.  Processed, convenience, and preserved foods also contain a large amount of sodium.  The body needs only 500 mg of sodium daily to function,  A normal diet provides more than enough sodium even if you do not use salt.  Why Limit Sodium? A build up of sodium in the body can cause thirst, increased blood pressure, shortness of breath, and water retention.  Decreasing sodium in the diet can reduce edema and risk of heart attack or stroke associated with high blood pressure.  Keep in mind that there are many other factors involved in these health problems.  Heredity, obesity, lack of exercise, cigarette smoking, stress and what you eat all play a role.  General Guidelines:  Do not add salt at the table or in cooking.  One teaspoon of salt contains over 2 grams of sodium.  Read food labels  Avoid processed and convenience foods  Ask your dietitian before eating any foods not dicussed in the menu planning guidelines  Consult your physician if you wish to use a salt substitute or a sodium containing medication such as antacids.  Limit milk and milk products to 16 oz (2 cups) per day.  Shopping Hints:  READ LABELS!! "Dietetic" does not necessarily mean low sodium.  Salt and other sodium ingredients are often added to foods during processing.   Menu Planning Guidelines Food Group Choose More Often Avoid  Beverages (see also the milk group  All fruit juices, low-sodium, salt-free vegetables juices, low-sodium carbonated beverages Regular vegetable or tomato juices, commercially softened water used for drinking or cooking  Breads and Cereals Enriched white, wheat, rye and pumpernickel bread, hard rolls and dinner rolls; muffins, cornbread and waffles; most dry cereals, cooked cereal without added salt; unsalted crackers and breadsticks; low sodium or homemade bread crumbs Bread, rolls and crackers with salted tops; quick breads; instant hot cereals; pancakes; commercial bread stuffing; self-rising flower and biscuit mixes; regular bread crumbs or cracker crumbs  Desserts and Sweets Desserts and sweets mad with mild should be within allowance Instant pudding mixes and cake mixes  Fats Butter or margarine; vegetable oils; unsalted salad dressings, regular salad dressings limited to 1 Tbs; light, sour and heavy cream Regular salad dressings containing bacon fat, bacon bits, and salt pork; snack dips made with instant soup mixes or processed cheese; salted nuts  Fruits Most fresh, frozen and canned fruits Fruits processed with salt or sodium-containing ingredient (some dried fruits are processed with sodium sulfites        Vegetables Fresh, frozen vegetables and low- sodium canned vegetables Regular canned vegetables, sauerkraut, pickled vegetables, and others prepared in brine; frozen vegetables in sauces; vegetables seasoned with ham, bacon or salt pork  Condiments, Sauces, Miscellaneous  Salt substitute with physician's approval; pepper, herbs, spices; vinegar, lemon or lime juice; hot pepper sauce; garlic powder, onion powder, low sodium soy sauce (1 Tbs.); low sodium condiments (ketchup, chili sauce,  mustard) in limited amounts (1 tsp.) fresh ground horseradish; unsalted tortilla chips, pretzels, potato chips, popcorn, salsa (1/4 cup) Any seasoning made with salt including garlic salt, celery salt, onion salt, and seasoned salt; sea salt,  rock salt, kosher salt; meat tenderizers; monosodium glutamate; mustard, regular soy sauce, barbecue, sauce, chili sauce, teriyaki sauce, steak sauce, Worcestershire sauce, and most flavored vinegars; canned gravy and mixes; regular condiments; salted snack foods, olives, picles, relish, horseradish sauce, catsup   Food preparation: Try these seasonings Meats:    Pork Sage, onion Serve with applesauce  Chicken Poultry seasoning, thyme, parsley Serve with cranberry sauce  Lamb Curry powder, rosemary, garlic, thyme Serve with mint sauce or jelly  Veal Marjoram, basil Serve with current jelly, cranberry sauce  Beef Pepper, bay leaf Serve with dry mustard, unsalted chive butter  Fish Bay leaf, dill Serve with unsalted lemon butter, unsalted parsley butter  Vegetables:    Asparagus Lemon juice   Broccoli Lemon juice   Carrots Mustard dressing parsley, mint, nutmeg, glazed with unsalted butter and sugar   Green beans Marjoram, lemon juice, nutmeg,dill seed   Tomatoes Basil, marjoram, onion   Spice /blend for [employer]"Salt Shaker" 4 tsp ground thyme 1 tsp ground sage 3 tsp ground rosemary 4 tsp ground marjoram   Test your knowledge 1. A product that says "Salt Free" may still contain sodium. True or False 2. Garlic Powder and Hot Pepper Sauce an be used as alternative seasonings.True or False 3. Processed foods have more sodium than fresh foods.  True or False 4. Canned Vegetables have less sodium than froze True or False  WAYS TO DECREASE YOUR SODIUM INTAKE 1. Avoid the use of added salt in cooking and at the table.  Table salt (and other prepared seasonings which contain salt) is probably one of the greatest sources of sodium in the diet.  Unsalted foods can gain flavor from the sweet, sour, and butter taste sensations of herbs and spices.  Instead of using salt for seasoning, try the following seasonings with the foods listed.  Remember: how you use them to enhance natural food flavors is limited only  by your creativity... Allspice-Meat, fish, eggs, fruit, peas, red and yellow vegetables Almond Extract-Fruit baked goods Anise Seed-Sweet breads, fruit, carrots, beets, cottage cheese, cookies (tastes like licorice) Basil-Meat, fish, eggs, vegetables, rice, vegetables salads, soups, sauces Bay Leaf-Meat, fish, stews, poultry Burnet-Salad, vegetables (cucumber-like flavor) Caraway Seed-Bread, cookies, cottage cheese, meat, vegetables, cheese, rice Cardamon-Baked goods, fruit, soups Celery Powder or seed-Salads, salad dressings, sauces, meatloaf, soup, bread.Do not use  celery salt Chervil-Meats, salads, fish, eggs, vegetables, cottage cheese (parsley-like flavor) Chili Power-Meatloaf, chicken cheese, corn, eggplant, egg dishes Chives-Salads cottage cheese, egg dishes, soups, vegetables, sauces Cilantro-Salsa, casseroles Cinnamon-Baked goods, fruit, pork, lamb, chicken, carrots Cloves-Fruit, baked goods, fish, pot roast, green beans, beets, carrots Coriander-Pastry, cookies, meat, salads, cheese (lemon-orange flavor) Cumin-Meatloaf, fish,cheese, eggs, cabbage,fruit pie (caraway flavor) [employer]ersCurry Powder-Meat, fruit, eggs, fish, poultry, cottage cheese, vegetables Dill Seed-Meat, cottage cheese, poultry, vegetables, fish, salads, bread Fennel Seed-Bread, cookies, apples, pork, eggs, fish, beets, cabbage, cheese, Licorice-like flavor Garlic-(buds or powder) Salads, meat, poultry, fish, bread, butter, vegetables, potatoes.Do not  use garlic salt Ginger-Fruit, vegetables, baked goods, meat, fish, poultry Horseradish Root-Meet, vegetables, butter Lemon Juice or Extract-Vegetables, fruit, tea, baked goods, fish salads Mace-Baked goods fruit, vegetables, fish, poultry (taste like nutmeg) Maple Extract-Syrups Marjoram-Meat, chicken, fish, vegetables, breads, green salads (taste like Sage) Mint-Tea, lamb, sherbet, vegetables, desserts, carrots, cabbage Mustard, Dry or Seed-Cheese, eggs, meats,  vegetables, poultry Nutmeg-Baked goods, fruit, chicken, eggs, vegetables, desserts Onion Powder-Meat, fish, poultry, vegetables, cheese, eggs, bread, rice salads (Do not use   Onion salt) Orange Extract-Desserts, baked goods Oregano-Pasta, eggs, cheese, onions, pork, lamb, fish, chicken, vegetables, green salads Paprika-Meat, fish, poultry, eggs, cheese, vegetables Parsley Flakes-Butter, vegetables, meat fish, poultry, eggs, bread, salads (certain forms may   Contain sodium Pepper-Meat fish, poultry, vegetables, eggs Peppermint Extract-Desserts, baked goods Poppy Seed-Eggs, bread, cheese, fruit dressings, baked goods, noodles, vegetables, cottage  [employer], poultry, meat, fish, cauliflower, turnips,eggs bread Saffron-Rice, bread, veal, chicken, fish, eggs Sage-Meat, fish, poultry, onions, eggplant, tomateos, pork, stews Savory-Eggs, salads, poultry, meat, rice, vegetables, soups, pork Tarragon-Meat, poultry, fish, eggs, butter, vegetables (licorice-like flavor)  Thyme-Meat, poultry, fish, eggs, vegetables, (clover-like flavor), sauces, soups Tumeric-Salads, butter, eggs, fish, rice, vegetables (saffron-like flavor) Vanilla Extract-Baked goods, candy Vinegar-Salads, vegetables, meat marinades Walnut Extract-baked goods, candy  2. Choose your Foods Wisely   The following is a list of foods to avoid which are high in sodium:  Meats-Avoid all smoked, canned, salt cured, dried and kosher meat and fish as well as Anchovies   [employer] meats:Bologna, Liverwurst, Pastrami Canned meat or fish  Marinated herring Caviar    Pepperoni Corned Beef   Pizza Dried chipped beef  Salami Frozen breaded fish or meat Salt pork Frankfurters or hot dogs  Sardines Gefilte fish   Sausage Ham (boiled ham, Proscuitto Smoked butt    spiced ham)   Spam      TV Dinners Vegetables Canned vegetables (Regular) Relish Canned  mushrooms  Sauerkraut Olives    Tomato juice Pickles  Bakery and Dessert Products Canned puddings  Cream pies Cheesecake   Decorated cakes Cookies  Beverages/Juices Tomato juice, regular  Gatorade   V-8 vegetable juice, regular  Breads and Cereals Biscuit mixes   Salted potato chips, corn chips, pretzels Bread stuffing mixes  Salted crackers and rolls Pancake and waffle mixes Self-rising flour  Seasonings Accent    Meat sauces Barbecue sauce  Meat tenderizer Catsup    Monosodium glutamate (MSG) Celery salt   Onion salt Chili sauce   Prepared mustard Garlic salt   Salt, seasoned salt, sea salt Gravy mixes   Soy sauce Horseradish   Steak sauce Ketchup   Tartar sauce Lite salt    Teriyaki sauce Marinade mixes   Worcestershire sauce  Others Baking powder   Cocoa and cocoa mixes Baking soda   Commercial casserole mixes Candy-caramels, chocolate  Dehydrated soups    Bars, fudge,nougats  Instant rice and pasta mixes Canned broth or soup  Maraschino cherries Cheese, aged and processed cheese and cheese spreads  Learning Assessment Quiz  Indicated T (for True) or F (for False) for each of the following statements:  1. _____ Fresh fruits and vegetables and unprocessed grains are generally low in sodium 2. _____ Water may contain a considerable amount of sodium, depending on the source 3. _____ You can always tell if a food is high in sodium by tasting it 4. _____ Certain laxatives my be high in sodium and should be avoided unless prescribed   by a physician or pharmacist 5. _____ Salt substitutes may be used freely by anyone on a sodium restricted diet 6. _____ Sodium is present in table salt, food additives and as a natural component of   most foods 7. _____ Table salt is approximately 90% sodium 8. _____ Limiting sodium intake may help prevent excess fluid accumulation in the body  9. _____ On a sodium-restricted diet, seasonings such as bouillon soy sauce, and    cooking  wine should be used in place of table salt 10. _____ On an ingredient list, a product which lists monosodium glutamate as the first   ingredient is an appropriate food to include on a low sodium diet  Circle the best answer(s) to the following statements (Hint: there may be more than one correct answer)  11. On a low-sodium diet, some acceptable snack items are:    A. Olives  F. Bean dip   K. Grapefruit juice    B. Salted Pretzels G. Commercial Popcorn   L. Canned peaches    C. Carrot Sticks  H. Bouillon   M. Unsalted nuts   D. [place] fries  I. Peanut butter crackers N. Salami   E. Sweet pickles J. Tomato Juice   O. Pizza  12.  Seasonings that may be used freely on a reduced - sodium diet include   A. Lemon wedges F.Monosodium glutamate K. Celery seed    B.Soysauce   G. Pepper   L. Mustard powder   C. Sea salt  H. Cooking wine  M. Onion flakes   D. Vinegar  E. Prepared horseradish N. Salsa   E. Sage   J. Worcestershire sauce  O. Chutney     If you need a refill on your cardiac medications before your next appointment, please call your pharmacy.

## 2019-01-17 NOTE — Progress Notes (Signed)
Cardiology Office Note    Date:  01/17/2019   ID:  Virginia Marks, DOB 12/10/1955, MRN [id number]  PCP:  Benita Stabile, MD  Cardiologist: Dina Rich, MD EPS: None  Chief Complaint  Patient presents with   Hospitalization Follow-up    History of Present Illness:  Virginia Marks is a 63 y.o. female with history of HTN, Palpitations with PVC's, OSA on BiPap, chest pain 09/2017 with normal NST.  Went to ER 01/14/19 with increased edema and weight gain. BP running high at home and then had some chest pressure. Chest pain resolved with diuresis and BP control after Lasix 40 mg IV given. BNP 51 other labs normal, troponin negative.  Complains of BP running high for a few months. Was started on Olmesartan and ordered an echo done 01/05/19 Normal LVEF 01/05/19, diastolic dysfunction. 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 She is working 90 hrs/week. Eating out a lot-usually [place]. Has an exchange student coming to stay with her next week. Ex-Husband who was going to move back in died suddenly of a heart attack.  Past Medical History:  Diagnosis Date   Hyperlipidemia    Hypertension    Psoriasis    SCALP   Spinal stenosis     Past Surgical History:  Procedure Laterality Date   BREAST EXCISIONAL BIOPSY Right 2001   negative   BREAST EXCISIONAL BIOPSY Right 2002   negative    Current Medications: Current Meds  Medication Sig   Ascorbic Acid (VITA-C PO) Take by mouth.   aspirin 325 MG tablet Take 325 mg by mouth daily.   Biotin 10 MG CAPS Take 1 capsule by mouth daily.   buPROPion (WELLBUTRIN XL) 150 MG 24 hr tablet    CALCIUM-VITAMIN D PO Take 1 tablet by mouth daily. Calcium w/ Vitamin D3 5,000 units   clobetasol (TEMOVATE) 0.05 % external solution Apply 1 application topically as needed.    diclofenac (VOLTAREN) 75 MG EC tablet Take 1 tablet (75 mg total) by mouth 2 (two) times daily. For muscle and  Joint  pain   docusate sodium (COLACE) 100 MG capsule Take 100 mg by mouth at bedtime as needed for mild constipation.   DULoxetine (CYMBALTA) 60 MG capsule    ferrous sulfate 325 (65 FE) MG EC tablet Take 325 mg by mouth daily.   gabapentin (NEURONTIN) 300 MG capsule Take 300 mg by mouth 3 (three) times daily.    Glucosamine-Chondroitin 1500-1200 MG/30ML LIQD Take 1 capsule by mouth daily.   levothyroxine (SYNTHROID) 75 MCG tablet    metoprolol tartrate (LOPRESSOR) 25 MG tablet TAKE 1/2 TABLET BY MOUTH 2 TIMES A DAY.   nitroGLYCERIN (NITROSTAT) 0.4 MG SL tablet Place 1 tablet (0.4 mg total) under the tongue every 5 (five) minutes as needed for chest pain.   olmesartan (BENICAR) 20 MG tablet    Omega-3 Fatty Acids (FISH OIL PO) Take 1 capsule by mouth daily.   omeprazole (PRILOSEC) 40 MG capsule Take 40 mg by mouth daily.    potassium chloride SA (K-DUR,KLOR-CON) 20 MEQ tablet Take 1 tablet (20 mEq total) by mouth daily as needed.   tiZANidine (ZANAFLEX) 4 MG tablet    [DISCONTINUED] furosemide (LASIX) 20 MG tablet Take 20 mg by mouth daily.     Allergies:   Sulfa antibiotics   Social History   Socioeconomic History   Marital status: Divorced    Spouse name: Not on file  Number of children: Not on file   Years of education: Not on file   Highest education level: Not on file  Occupational History   Not on file  Social Needs   Financial resource strain: Not on file   Food insecurity    Worry: Not on file    Inability: Not on file   Transportation needs    Medical: Not on file    Non-medical: Not on file  Tobacco Use   Smoking status: Never Smoker   Smokeless tobacco: Never Used  Substance and Sexual Activity   Alcohol use: Yes    Alcohol/week: 1.0 standard drinks    Types: 1 Cans of beer per week   Drug use: Never   Sexual activity: Not Currently  Lifestyle   Physical activity    Days per week: Not on file    Minutes per session: Not on file    Stress: Not on file  Relationships   Social connections    Talks on phone: Not on file    Gets together: Not on file    Attends religious service: Not on file    Active member of club or organization: Not on file    Attends meetings of clubs or organizations: Not on file    Relationship status: Not on file  Other Topics Concern   Not on file  Social History Narrative   Not on file     Family History:  The patient's   family history includes Breast cancer (age of onset: 20) in her sister; Breast cancer (age of onset: 72) in her maternal aunt, maternal aunt, and maternal aunt; Breast cancer (age of onset: 37) in her maternal aunt; Hypertension in her mother.   ROS:   Please see the history of present illness.    ROS All other systems reviewed and are negative.   PHYSICAL EXAM:   VS:  BP 111/62    Pulse 67    Temp (!) 96.6 F (35.9 C) (Temporal)    Ht 5' 4.5" (1.638 m)    Wt 252 lb (114.3 kg)    SpO2 94%    BMI 42.59 kg/m   Physical Exam  GEN: Obese,  in no acute distress  Neck: no JVD, carotid bruits, or masses Cardiac:RRR; no murmurs, rubs, or gallops  Respiratory:  clear to auscultation bilaterally, normal work of breathing GI: soft, nontender, nondistended, + BS Ext: without cyanosis, clubbing, or edema, Good distal pulses bilaterally Neuro:  Alert and Oriented x 3,  Psych: euthymic mood, full affect  Wt Readings from Last 3 Encounters:  01/17/19 252 lb (114.3 kg)  01/13/19 252 lb (114.3 kg)  12/24/18 248 lb (112.5 kg)      Studies/Labs Reviewed:   EKG:  EKG is not ordered today.  The ekg reviewed from ED 01/14/19 NSR, low voltage, poor R wave progression, no acute change EKG 09/23/17 Recent Labs: 01/14/2019: ALT 38; B Natriuretic Peptide 51.0; BUN 18; Creatinine, Ser 0.79; Hemoglobin 12.1; Platelets 263; Potassium 3.9; Sodium 139   Lipid Panel No results found for: CHOL, TRIG, HDL, CHOLHDL, VLDL, LDLCALC, LDLDIRECT  Additional studies/ records that were reviewed  today include:  CXR, CT EKG and labs reviewed from ER visit 01/14/19   Echocardiogram: 10/2015 Study Conclusions   - Left ventricle: The cavity size was normal. Wall thickness was   increased in a pattern of mild LVH. Systolic function was normal.   The estimated ejection fraction was in the range of 60% to 65%.  Wall motion was normal; there were no regional wall motion   abnormalities. Features are consistent with a pseudonormal left   ventricular filling pattern, with concomitant abnormal relaxation   and increased filling pressure (grade 2 diastolic dysfunction). - Aortic valve: Mildly calcified annulus. Trileaflet. There was   very mild stenosis. Peak velocity (S): 200 cm/s. Mean gradient   (S): 8 mm Hg. Valve area (VTI): 1.69 cm^2. Valve area (Vmax):   1.72 cm^2. Valve area (Vmean): 1.79 cm^2. - Mitral valve: Calcified annulus.   Event Monitor: 10/2015  Telemetry tracings show sinus rhythm  Reported symptoms correlate with sinus rhythm and sinus tachycardia with occasional PVCs  No significant arrhythmias   NST: 09/2017  Blood pressure demonstrated a hypertensive response to exercise.  There was no ST segment deviation noted during stress. Diffuse nonspecific T wave abnormalities seen throughout study.  The study is normal. No myocardial ischemia or scar. Rest images affected by artifact.  This is a low risk study.  Nuclear stress EF: 66%.    ASSESSMENT:    1. Chronic diastolic CHF (congestive heart failure) ([place])   2. Essential hypertension   3. History of chest pain   4. Palpitations   5. Morbid obesity ([place])   6. Anxiety      PLAN:  In order of problems listed above:  Chronic diastolic CHF-grade 2 DD on echo 2017 with recent ER visit for edema treated with IV Lasix 40 mg x1 and lasix increased to 40 mg daily for 2 days but swelling coming back. Will increase lasix 40 mg daily kdur 20 meq daily. Check bmet in 2 weeks  HTN doing better on  olmesartan  Chest pain with CHF resolved with diuresis. troponins negative no EKG changes. Normal NST 09/2017  Palpitations with PVC's in past on low dose metoprolol. Baseline HR in 50's  OSA on Bipap  Obesity-exercise and weight loss program refer [facility] Dr. Adair Patter [facility]  Anxiety/stress-refer to Dr. Cheryln Manly     Medication Adjustments/Labs and Tests Ordered: Current medicines are reviewed at length with the patient today.  Concerns regarding medicines are outlined above.  Medication changes, Labs and Tests ordered today are listed in the Patient Instructions below. Patient Instructions   Medication Instructions:  INCREASE LASIX 40 MG DAILY   Labwork: 2 WEEKS  BMET   Testing/Procedures: NONE  You have been [facility] to [facility] You have been referred to PSYCHOLOGIST     Follow-Up: Your physician recommends that you schedule a follow-up appointment in: 2 MONTHS      Any Other Special Instructions Will Be Listed Below (If Applicable). Two Gram Sodium Diet 2000 mg  What is Sodium? Sodium is a mineral found naturally in many foods. The most significant source of sodium in the diet is table salt, which is about 40% sodium.  Processed, convenience, and preserved foods also contain a large amount of sodium.  The body needs only 500 mg of sodium daily to function,  A normal diet provides more than enough sodium even if you do not use salt.  Why Limit Sodium? A build up of sodium in the body can cause thirst, increased blood pressure, shortness of breath, and water retention.  Decreasing sodium in the diet can reduce edema and risk of heart attack or stroke associated with high blood pressure.  Keep in mind that there are many other factors involved in these health problems.  Heredity, obesity, lack of exercise, cigarette smoking, stress and what you eat  all play a role.  General Guidelines:  Do not add salt at the table or in cooking.  One teaspoon of  salt contains over 2 grams of sodium.  Read food labels  Avoid processed and convenience foods  Ask your dietitian before eating any foods not dicussed in the menu planning guidelines  Consult your physician if you wish to use a salt substitute or a sodium containing medication such as antacids.  Limit milk and milk products to 16 oz (2 cups) per day.  Shopping Hints:  READ LABELS!! "Dietetic" does not necessarily mean low sodium.  Salt and other sodium ingredients are often added to foods during processing.   Menu Planning Guidelines Food Group Choose More Often Avoid  Beverages (see also the milk group All fruit juices, low-sodium, salt-free vegetables juices, low-sodium carbonated beverages Regular vegetable or tomato juices, commercially softened water used for drinking or cooking  Breads and Cereals Enriched white, wheat, rye and pumpernickel bread, hard rolls and dinner rolls; muffins, cornbread and waffles; most dry cereals, cooked cereal without added salt; unsalted crackers and breadsticks; low sodium or homemade bread crumbs Bread, rolls and crackers with salted tops; quick breads; instant hot cereals; pancakes; commercial bread stuffing; self-rising flower and biscuit mixes; regular bread crumbs or cracker crumbs  Desserts and Sweets Desserts and sweets mad with mild should be within allowance Instant pudding mixes and cake mixes  Fats Butter or margarine; vegetable oils; unsalted salad dressings, regular salad dressings limited to 1 Tbs; light, sour and heavy cream Regular salad dressings containing bacon fat, bacon bits, and salt pork; snack dips made with instant soup mixes or processed cheese; salted nuts  Fruits Most fresh, frozen and canned fruits Fruits processed with salt or sodium-containing ingredient (some dried fruits are processed with sodium sulfites        Vegetables Fresh, frozen vegetables and low- sodium canned vegetables Regular canned vegetables,  sauerkraut, pickled vegetables, and others prepared in brine; frozen vegetables in sauces; vegetables seasoned with ham, bacon or salt pork  Condiments, Sauces, Miscellaneous  Salt substitute with physician's approval; pepper, herbs, spices; vinegar, lemon or lime juice; hot pepper sauce; garlic powder, onion powder, low sodium soy sauce (1 Tbs.); low sodium condiments (ketchup, chili sauce, mustard) in limited amounts (1 tsp.) fresh ground horseradish; unsalted tortilla chips, pretzels, potato chips, popcorn, salsa (1/4 cup) Any seasoning made with salt including garlic salt, celery salt, onion salt, and seasoned salt; sea salt, rock salt, kosher salt; meat tenderizers; monosodium glutamate; mustard, regular soy sauce, barbecue, sauce, chili sauce, teriyaki sauce, steak sauce, Worcestershire sauce, and most flavored vinegars; canned gravy and mixes; regular condiments; salted snack foods, olives, picles, relish, horseradish sauce, catsup   Food preparation: Try these seasonings Meats:    Pork Sage, onion Serve with applesauce  Chicken Poultry seasoning, thyme, parsley Serve with cranberry sauce  Lamb Curry powder, rosemary, garlic, thyme Serve with mint sauce or jelly  Veal Marjoram, basil Serve with current jelly, cranberry sauce  Beef Pepper, bay leaf Serve with dry mustard, unsalted chive butter  Fish Bay leaf, dill Serve with unsalted lemon butter, unsalted parsley butter  Vegetables:    Asparagus Lemon juice   Broccoli Lemon juice   Carrots Mustard dressing parsley, mint, nutmeg, glazed with unsalted butter and sugar   Green beans Marjoram, lemon juice, nutmeg,dill seed   Tomatoes Basil, marjoram, onion   Spice /blend for [employer]"Salt Shaker" 4 tsp ground thyme 1 tsp ground sage 3 tsp ground rosemary 4 tsp  ground marjoram   Test your knowledge 1. A product that says "Salt Free" may still contain sodium. True or False 2. Garlic Powder and Hot Pepper Sauce an be used as alternative  seasonings.True or False 3. Processed foods have more sodium than fresh foods.  True or False 4. Canned Vegetables have less sodium than froze True or False  WAYS TO DECREASE YOUR SODIUM INTAKE 1. Avoid the use of added salt in cooking and at the table.  Table salt (and other prepared seasonings which contain salt) is probably one of the greatest sources of sodium in the diet.  Unsalted foods can gain flavor from the sweet, sour, and butter taste sensations of herbs and spices.  Instead of using salt for seasoning, try the following seasonings with the foods listed.  Remember: how you use them to enhance natural food flavors is limited only by your creativity... Allspice-Meat, fish, eggs, fruit, peas, red and yellow vegetables Almond Extract-Fruit baked goods Anise Seed-Sweet breads, fruit, carrots, beets, cottage cheese, cookies (tastes like licorice) Basil-Meat, fish, eggs, vegetables, rice, vegetables salads, soups, sauces Bay Leaf-Meat, fish, stews, poultry Burnet-Salad, vegetables (cucumber-like flavor) Caraway Seed-Bread, cookies, cottage cheese, meat, vegetables, cheese, rice Cardamon-Baked goods, fruit, soups Celery Powder or seed-Salads, salad dressings, sauces, meatloaf, soup, bread.Do not use  celery salt Chervil-Meats, salads, fish, eggs, vegetables, cottage cheese (parsley-like flavor) Chili Power-Meatloaf, chicken cheese, corn, eggplant, egg dishes Chives-Salads cottage cheese, egg dishes, soups, vegetables, sauces Cilantro-Salsa, casseroles Cinnamon-Baked goods, fruit, pork, lamb, chicken, carrots Cloves-Fruit, baked goods, fish, pot roast, green beans, beets, carrots Coriander-Pastry, cookies, meat, salads, cheese (lemon-orange flavor) Cumin-Meatloaf, fish,cheese, eggs, cabbage,fruit pie (caraway flavor) [employer], fruit, eggs, fish, poultry, cottage cheese, vegetables Dill Seed-Meat, cottage cheese, poultry, vegetables, fish, salads, bread Fennel Seed-Bread, cookies,  apples, pork, eggs, fish, beets, cabbage, cheese, Licorice-like flavor Garlic-(buds or powder) Salads, meat, poultry, fish, bread, butter, vegetables, potatoes.Do not  use garlic salt Ginger-Fruit, vegetables, baked goods, meat, fish, poultry Horseradish Root-Meet, vegetables, butter Lemon Juice or Extract-Vegetables, fruit, tea, baked goods, fish salads Mace-Baked goods fruit, vegetables, fish, poultry (taste like nutmeg) Maple Extract-Syrups Marjoram-Meat, chicken, fish, vegetables, breads, green salads (taste like Sage) Mint-Tea, lamb, sherbet, vegetables, desserts, carrots, cabbage Mustard, Dry or Seed-Cheese, eggs, meats, vegetables, poultry Nutmeg-Baked goods, fruit, chicken, eggs, vegetables, desserts Onion Powder-Meat, fish, poultry, vegetables, cheese, eggs, bread, rice salads (Do not use   Onion salt) Orange Extract-Desserts, baked goods Oregano-Pasta, eggs, cheese, onions, pork, lamb, fish, chicken, vegetables, green salads Paprika-Meat, fish, poultry, eggs, cheese, vegetables Parsley Flakes-Butter, vegetables, meat fish, poultry, eggs, bread, salads (certain forms may   Contain sodium Pepper-Meat fish, poultry, vegetables, eggs Peppermint Extract-Desserts, baked goods Poppy Seed-Eggs, bread, cheese, fruit dressings, baked goods, noodles, vegetables, cottage  [employer], poultry, meat, fish, cauliflower, turnips,eggs bread Saffron-Rice, bread, veal, chicken, fish, eggs Sage-Meat, fish, poultry, onions, eggplant, tomateos, pork, stews Savory-Eggs, salads, poultry, meat, rice, vegetables, soups, pork Tarragon-Meat, poultry, fish, eggs, butter, vegetables (licorice-like flavor)  Thyme-Meat, poultry, fish, eggs, vegetables, (clover-like flavor), sauces, soups Tumeric-Salads, butter, eggs, fish, rice, vegetables (saffron-like flavor) Vanilla Extract-Baked goods, candy Vinegar-Salads, vegetables, meat marinades Walnut Extract-baked goods,  candy  2. Choose your Foods Wisely   The following is a list of foods to avoid which are high in sodium:  Meats-Avoid all smoked, canned, salt cured, dried and kosher meat and fish as well as Anchovies   [employer] meats:Bologna, Liverwurst, Pastrami Canned meat or fish  Marinated herring Caviar  Pepperoni Corned Beef   Pizza Dried chipped beef  Salami Frozen breaded fish or meat Salt pork Frankfurters or hot dogs  Sardines Gefilte fish   Sausage Ham (boiled ham, Proscuitto Smoked butt    spiced ham)   Spam      TV Dinners Vegetables Canned vegetables (Regular) Relish Canned mushrooms  Sauerkraut Olives    Tomato juice Pickles  Bakery and Dessert Products Canned puddings  Cream pies Cheesecake   Decorated cakes Cookies  Beverages/Juices Tomato juice, regular  Gatorade   V-8 vegetable juice, regular  Breads and Cereals Biscuit mixes   Salted potato chips, corn chips, pretzels Bread stuffing mixes  Salted crackers and rolls Pancake and waffle mixes Self-rising flour  Seasonings Accent    Meat sauces Barbecue sauce  Meat tenderizer Catsup    Monosodium glutamate (MSG) Celery salt   Onion salt Chili sauce   Prepared mustard Garlic salt   Salt, seasoned salt, sea salt Gravy mixes   Soy sauce Horseradish   Steak sauce Ketchup   Tartar sauce Lite salt    Teriyaki sauce Marinade mixes   Worcestershire sauce  Others Baking powder   Cocoa and cocoa mixes Baking soda   Commercial casserole mixes Candy-caramels, chocolate  Dehydrated soups    Bars, fudge,nougats  Instant rice and pasta mixes Canned broth or soup  Maraschino cherries Cheese, aged and processed cheese and cheese spreads  Learning Assessment Quiz  Indicated T (for True) or F (for False) for each of the following statements:  1. _____ Fresh fruits and vegetables and unprocessed grains are generally low in sodium 2. _____ Water may contain a considerable amount of sodium, depending on the  source 3. _____ You can always tell if a food is high in sodium by tasting it 4. _____ Certain laxatives my be high in sodium and should be avoided unless prescribed   by a physician or pharmacist 5. _____ Salt substitutes may be used freely by anyone on a sodium restricted diet 6. _____ Sodium is present in table salt, food additives and as a natural component of   most foods 7. _____ Table salt is approximately 90% sodium 8. _____ Limiting sodium intake may help prevent excess fluid accumulation in the body 9. _____ On a sodium-restricted diet, seasonings such as bouillon soy sauce, and    cooking wine should be used in place of table salt 10. _____ On an ingredient list, a product which lists monosodium glutamate as the first   ingredient is an appropriate food to include on a low sodium diet  Circle the best answer(s) to the following statements (Hint: there may be more than one correct answer)  11. On a low-sodium diet, some acceptable snack items are:    A. Olives  F. Bean dip   K. Grapefruit juice    B. Salted Pretzels G. Commercial Popcorn   L. Canned peaches    C. Carrot Sticks  H. Bouillon   M. Unsalted nuts   D. [place] fries  I. Peanut butter crackers N. Salami   E. Sweet pickles J. Tomato Juice   O. Pizza  12.  Seasonings that may be used freely on a reduced - sodium diet include   A. Lemon wedges F.Monosodium glutamate K. Celery seed    B.Soysauce   G. Pepper   L. Mustard powder   C. Sea salt  H. Cooking wine  M. Onion flakes   D. Vinegar  E. Prepared horseradish N. Salsa   E.  Sage   J. Worcestershire sauce  O. Chutney     If you need a refill on your cardiac medications before your next appointment, please call your pharmacy.      Signed, Jacolyn Reedy, PA-C  01/17/2019 2:50 PM    [facility] [address] [place], [place], [place]  [address] Phone: [phone]; Fax: [phone]

## 2019-02-05 LAB — BASIC METABOLIC PANEL
BUN: 16 mg/dL (ref 7–25)
CO2: 29 mmol/L (ref 20–32)
Calcium: 9.6 mg/dL (ref 8.6–10.4)
Chloride: 101 mmol/L (ref 98–110)
Creat: 0.87 mg/dL (ref 0.50–0.99)
Glucose, Bld: 176 mg/dL — ABNORMAL HIGH (ref 65–99)
Potassium: 4.2 mmol/L (ref 3.5–5.3)
Sodium: 140 mmol/L (ref 135–146)

## 2019-02-23 ENCOUNTER — Encounter: Payer: Self-pay | Admitting: Cardiology

## 2019-02-23 ENCOUNTER — Other Ambulatory Visit: Payer: Self-pay

## 2019-02-23 ENCOUNTER — Ambulatory Visit: Payer: BC Managed Care – PPO | Admitting: Cardiology

## 2019-02-23 VITALS — BP 120/71 | HR 68 | Temp 96.2°F | Ht 64.0 in | Wt 249.0 lb

## 2019-02-23 DIAGNOSIS — I5033 Acute on chronic diastolic (congestive) heart failure: Secondary | ICD-10-CM

## 2019-02-23 MED ORDER — FUROSEMIDE 40 MG PO TABS: 60.0000 mg | ORAL_TABLET | Freq: Every day | ORAL | 3 refills | Status: DC

## 2019-02-23 NOTE — Progress Notes (Signed)
Clinical Summary Virginia Marks is a 63 y.o.female seen today for follow up of the following medical problems. This is a focused visit on recent issues with diastolic HF.     1. LE edema/Chronic diastolic HF -2017 echo with LVEF 60-65%, grade II diastolic dysfunction - limiting sodium intake.Takes lasix as needed.  - no recent issues.   12/2018 echo LVEF 55-60%, indet diastolic function, normal RV - lasix increased to 40mg  daily at 01/17/19 f/u - swelling remains up and down. Home weights 246-248 lbs.  - limiting sodium intake.  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 Has 2 foreign students living with her that just moved back, awaiting more exchange students after the summer    Past Medical History:  Diagnosis Date  . Hyperlipidemia   . Hypertension   . Psoriasis    SCALP  . Spinal stenosis      Allergies  Allergen Reactions  . Sulfa Antibiotics Rash     Current Outpatient Medications  Medication Sig Dispense Refill  . Ascorbic Acid (VITA-C PO) Take by mouth.    Marland Kitchen. aspirin 325 MG tablet Take 325 mg by mouth daily.    . Biotin 10 MG CAPS Take 1 capsule by mouth daily.    Marland Kitchen. buPROPion (WELLBUTRIN XL) 150 MG 24 hr tablet     . CALCIUM-VITAMIN D PO Take 1 tablet by mouth daily. Calcium w/ Vitamin D3 5,000 units    . clobetasol (TEMOVATE) 0.05 % external solution Apply 1 application topically as needed.     . diclofenac (VOLTAREN) 75 MG EC tablet Take 1 tablet (75 mg total) by mouth 2 (two) times daily. For muscle and  Joint pain 60 tablet 2  . docusate sodium (COLACE) 100 MG capsule Take 100 mg by mouth at bedtime as needed for mild constipation.    . DULoxetine (CYMBALTA) 60 MG capsule     . ferrous sulfate 325 (65 FE) MG EC tablet Take 325 mg by mouth daily.    . furosemide (LASIX) 40 MG tablet Take 1 tablet (40 mg total) by mouth daily. 90 tablet 3   . gabapentin (NEURONTIN) 300 MG capsule Take 300 mg by mouth 3 (three) times daily.     . Glucosamine-Chondroitin 1500-1200 MG/30ML LIQD Take 1 capsule by mouth daily.    Marland Kitchen. levothyroxine (SYNTHROID) 75 MCG tablet     . metoprolol tartrate (LOPRESSOR) 25 MG tablet TAKE 1/2 TABLET BY MOUTH 2 TIMES A DAY. 90 tablet 3  . nitroGLYCERIN (NITROSTAT) 0.4 MG SL tablet Place 1 tablet (0.4 mg total) under the tongue every 5 (five) minutes as needed for chest pain. 25 tablet 3  . olmesartan (BENICAR) 20 MG tablet     . Omega-3 Fatty Acids (FISH OIL PO) Take 1 capsule by mouth daily.    Marland Kitchen. omeprazole (PRILOSEC) 40 MG capsule Take 40 mg by mouth daily.     . potassium chloride SA (K-DUR,KLOR-CON) 20 MEQ tablet Take 1 tablet (20 mEq total) by mouth daily as needed. 90 tablet 3  . tiZANidine (ZANAFLEX) 4 MG tablet      No current facility-administered medications for this visit.      Past Surgical History:  Procedure Laterality Date  . BREAST EXCISIONAL BIOPSY Right 2001   negative  . BREAST EXCISIONAL BIOPSY Right 2002   negative     Allergies  Allergen Reactions  . Sulfa Antibiotics Rash      Family History  Problem Relation Age of Onset  . Breast cancer Sister 85  . Breast cancer Maternal Aunt 70  . Breast cancer Maternal Aunt 70  . Breast cancer Maternal Aunt 80  . Breast cancer Maternal Aunt 70  . Hypertension Mother      Social History Virginia Marks reports that she has never smoked. She has never used smokeless tobacco. Virginia Marks reports current alcohol use of about 1.0 standard drinks of alcohol per week.   Review of Systems CONSTITUTIONAL: No weight loss, fever, chills, weakness or fatigue.  HEENT: Eyes: No visual loss, blurred vision, double vision or yellow sclerae.No hearing loss, sneezing, congestion, runny nose or sore throat.  SKIN: No rash or itching.  CARDIOVASCULAR: per hpi RESPIRATORY: No shortness of breath, cough or sputum.  GASTROINTESTINAL: No anorexia,  nausea, vomiting or diarrhea. No abdominal pain or blood.  GENITOURINARY: No burning on urination, no polyuria NEUROLOGICAL: No headache, dizziness, syncope, paralysis, ataxia, numbness or tingling in the extremities. No change in bowel or bladder control.  MUSCULOSKELETAL: No muscle, back pain, joint pain or stiffness.  LYMPHATICS: No enlarged nodes. No history of splenectomy.  PSYCHIATRIC: No history of depression or anxiety.  ENDOCRINOLOGIC: No reports of sweating, cold or heat intolerance. No polyuria or polydipsia.  Marland Kitchen   Physical Examination Today's Vitals   02/23/19 1336  BP: 120/71  Pulse: 68  Temp: (!) 96.2 F (35.7 C)  SpO2: 98%  Weight: 249 lb (112.9 kg)  Height: 5\' 4"  (1.626 m)   Body mass index is 42.74 kg/m.  Gen: resting comfortably, no acute distress HEENT: no scleral icterus, pupils equal round and reactive, no palptable cervical adenopathy,  CV: RRR, no m/r/g, no jvd Resp: Clear to auscultation bilaterally GI: abdomen is soft, non-tender, non-distended, normal bowel sounds, no hepatosplenomegaly MSK: extremities are warm, 1+ bilateral LE edema  Skin: warm, no rash Neuro:  no focal deficits Psych: appropriate affect   Diagnostic Studies  10/2015 echo Study Conclusions  - Left ventricle: The cavity size was normal. Wall thickness was increased in a pattern of mild LVH. Systolic function was normal. The estimated ejection fraction was in the range of 60% to 65%. Wall motion was normal; there were no regional wall motion abnormalities. Features are consistent with a pseudonormal left ventricular filling pattern, with concomitant abnormal relaxation and increased filling pressure (grade 2 diastolic dysfunction). - Aortic valve: Mildly calcified annulus. Trileaflet. There was very mild stenosis. Peak velocity (S): 200 cm/s. Mean gradient (S): 8 mm Hg. Valve area (VTI): 1.69 cm^2. Valve area (Vmax): 1.72 cm^2. Valve area (Vmean): 1.79 cm^2.  - Mitral valve: Calcified annulus.  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   09/2017 nuclear stress  Blood pressure demonstrated a hypertensive response to exercise.  There was no ST segment deviation noted during stress. Diffuse nonspecific T wave abnormalities seen throughout study.  The study is normal. No myocardial ischemia or scar. Rest images affected by artifact.  This is a low risk study.  Nuclear stress EF: 66%.   12/2018 echo IMPRESSIONS    1. Left ventricular ejection fraction, by visual estimation, is 55 to 60%. The left ventricle has normal function. There is mildly increased left ventricular hypertrophy.  2. Left ventricular diastolic Doppler parameters are indeterminate  pattern of LV diastolic filling.  3. Global right ventricle has normal systolic function.The right ventricular size is normal. No increase in right ventricular wall thickness.  4. Left atrial size was moderately dilated.  5. Right atrial size was normal.  6. The mitral valve is normal in structure. No evidence of mitral valve regurgitation. No evidence of mitral stenosis.  7. The tricuspid valve is not well visualized. Tricuspid valve regurgitation is trivial.  8. The aortic valve has an indeterminant number of cusps Aortic valve regurgitation was not visualized by color flow Doppler. Structurally normal aortic valve, with no evidence of sclerosis or stenosis.  9. The pulmonic valve was not well visualized. Pulmonic valve regurgitation is not visualized by color flow Doppler. 10. The inferior vena cava is normal in size with <50% respiratory variability, suggesting right atrial pressure of 8 mmHg.   Assessment and Plan   1.Acute on chronic diastolic HF - edema improving but not resovled, still appears mildly volume  overloaded - increase lasix to 60mg  daily, check bmet/Mg in 2 weeks      Arnoldo Lenis, M.D.

## 2019-02-23 NOTE — Patient Instructions (Signed)
Medication Instructions:  Increase lasix to 60 mg daily   Labwork: 2 weeks   Cbc bmet  Testing/Procedures: none  Follow-Up: Your physician recommends that you schedule a follow-up appointment in: 3 months    Any Other Special Instructions Will Be Listed Below (If Applicable).     If you need a refill on your cardiac medications before your next appointment, please call your pharmacy.

## 2019-03-09 ENCOUNTER — Other Ambulatory Visit: Payer: Self-pay

## 2019-03-09 ENCOUNTER — Other Ambulatory Visit (HOSPITAL_COMMUNITY)
Admission: RE | Admit: 2019-03-09 | Discharge: 2019-03-09 | Disposition: A | Discharge status: Home / Self Care | Payer: BC Managed Care – PPO | Source: Ambulatory Visit | Attending: Cardiology | Admitting: Cardiology

## 2019-03-09 DIAGNOSIS — I5032 Chronic diastolic (congestive) heart failure: Secondary | ICD-10-CM | POA: Insufficient documentation

## 2019-03-09 LAB — CBC WITH DIFFERENTIAL/PLATELET
Abs Immature Granulocytes: 0.02 10*3/uL (ref 0.00–0.07)
Basophils Absolute: 0 10*3/uL (ref 0.0–0.1)
Basophils Relative: 1 %
Eosinophils Absolute: 0.4 10*3/uL (ref 0.0–0.5)
Eosinophils Relative: 6 %
HCT: 41.1 % (ref 36.0–46.0)
Hemoglobin: 13.2 g/dL (ref 12.0–15.0)
Immature Granulocytes: 0 %
Lymphocytes Relative: 19 %
Lymphs Abs: 1.1 10*3/uL (ref 0.7–4.0)
MCH: 30.6 pg (ref 26.0–34.0)
MCHC: 32.1 g/dL (ref 30.0–36.0)
MCV: 95.1 fL (ref 80.0–100.0)
Monocytes Absolute: 0.5 10*3/uL (ref 0.1–1.0)
Monocytes Relative: 7 %
Neutro Abs: 4.1 10*3/uL (ref 1.7–7.7)
Neutrophils Relative %: 67 %
Platelets: 298 10*3/uL (ref 150–400)
RBC: 4.32 MIL/uL (ref 3.87–5.11)
RDW: 14.1 % (ref 11.5–15.5)
WBC: 6.1 10*3/uL (ref 4.0–10.5)
nRBC: 0 % (ref 0.0–0.2)

## 2019-03-09 LAB — BASIC METABOLIC PANEL
Anion gap: 10 (ref 5–15)
BUN: 14 mg/dL (ref 8–23)
CO2: 26 mmol/L (ref 22–32)
Calcium: 9 mg/dL (ref 8.9–10.3)
Chloride: 102 mmol/L (ref 98–111)
Creatinine, Ser: 0.83 mg/dL (ref 0.44–1.00)
GFR calc Af Amer: >60 mL/min (ref >60)
GFR calc non Af Amer: >60 mL/min (ref >60)
Glucose, Bld: 148 mg/dL — ABNORMAL HIGH (ref 70–99)
Potassium: 4.5 mmol/L (ref 3.5–5.1)
Sodium: 138 mmol/L (ref 135–145)

## 2019-03-18 ENCOUNTER — Telehealth: Payer: Self-pay | Admitting: Cardiology

## 2019-03-18 NOTE — Telephone Encounter (Signed)
Returned pt call, someone picked up phone as I could hear talking in the background. I hung up and tried to call back it was busy. I will try again later.

## 2019-03-18 NOTE — Telephone Encounter (Signed)
Would like to know lab results. °

## 2019-03-21 ENCOUNTER — Telehealth: Payer: Self-pay

## 2019-03-21 NOTE — Telephone Encounter (Signed)
Pt wants to know if she lessen her lasix dose.She states she feels dried out and urine out put is less.

## 2019-03-21 NOTE — Telephone Encounter (Signed)
-----   Message from Arnoldo Lenis, MD sent at 03/21/2019 10:34 AM EST ----- Normal labs   Zandra Abts MD

## 2019-03-22 MED ORDER — FUROSEMIDE 40 MG PO TABS: ORAL_TABLET | ORAL | 3 refills | Status: DC

## 2019-03-22 NOTE — Telephone Encounter (Signed)
Can take lasix 40mg  daily, can take additional 20mg  as needed for swelling   Zandra Abts MD

## 2019-03-22 NOTE — Telephone Encounter (Signed)
Pt.notified

## 2019-03-24 ENCOUNTER — Telehealth: Payer: Self-pay

## 2019-03-24 NOTE — Telephone Encounter (Signed)
Called pt. No answer. Left message for pt to return call.  

## 2019-03-24 NOTE — Telephone Encounter (Signed)
-----   Message from Jonathan F Branch, MD sent at 03/21/2019 10:34 AM EST ----- Normal labs   J Branch MD 

## 2019-04-04 ENCOUNTER — Other Ambulatory Visit: Payer: Self-pay | Admitting: Cardiology

## 2019-04-04 MED ORDER — FUROSEMIDE 40 MG PO TABS: ORAL_TABLET | ORAL | 3 refills | Status: DC

## 2019-04-04 NOTE — Telephone Encounter (Signed)
refill complete 

## 2019-04-04 NOTE — Telephone Encounter (Signed)
*  STAT* If patient is at the pharmacy, call can be transferred to refill team.   1. Which medications need to be refilled? furosemide (LASIX) 40 MG tablet   2. Which pharmacy/location (including street and city if local pharmacy) is medication to be sent to?[facility]  3. Do they need a 30 day or 90 day supply?

## 2019-05-20 ENCOUNTER — Other Ambulatory Visit: Payer: Self-pay | Admitting: Student

## 2019-06-12 ENCOUNTER — Ambulatory Visit: Payer: BC Managed Care – PPO | Attending: Internal Medicine

## 2019-06-12 DIAGNOSIS — Z23 Encounter for immunization: Secondary | ICD-10-CM | POA: Insufficient documentation

## 2019-06-12 NOTE — Progress Notes (Signed)
   Covid-19 Vaccination Clinic  Name:  Virginia Marks    MRN: [id number] DOB: 19-Jan-1956  06/12/2019  [name] was observed post Covid-19 immunization for 15 minutes without incident. She was provided with Vaccine Information Sheet and instruction to access the V-Safe system.   [name] was instructed to call 911 with any severe reactions post vaccine: Marland Kitchen Difficulty breathing  . Swelling of face and throat  . A fast heartbeat  . A bad rash all over body  . Dizziness and weakness   Immunizations Administered    Name Date Dose VIS Date Route   Pfizer COVID-19 Vaccine 06/12/2019  3:25 PM 0.3 mL 03/18/2019 Intramuscular   Manufacturer: [employer], [employer]   Lot: [id number]   NDC: [address]-7

## 2019-07-03 ENCOUNTER — Ambulatory Visit: Payer: BC Managed Care – PPO | Attending: Internal Medicine

## 2019-07-03 DIAGNOSIS — Z23 Encounter for immunization: Secondary | ICD-10-CM

## 2019-07-03 NOTE — Progress Notes (Signed)
   Covid-19 Vaccination Clinic  Name:  Virginia Marks    MRN: [id number] DOB: 1955/05/02  07/03/2019  Virginia Marks was observed post Covid-19 immunization for 15 minutes without incident. She was provided with Vaccine Information Sheet and instruction to access the V-Safe system.   Virginia Marks was instructed to call 911 with any severe reactions post vaccine: Marland Kitchen Difficulty breathing  . Swelling of face and throat  . A fast heartbeat  . A bad rash all over body  . Dizziness and weakness   Immunizations Administered    Name Date Dose VIS Date Route   Pfizer COVID-19 Vaccine 07/03/2019  1:49 PM 0.3 mL 03/18/2019 Intramuscular   Manufacturer: [employer], [employer]   Lot: [id number]   NDC: [address]-4

## 2019-07-20 ENCOUNTER — Telehealth: Payer: Self-pay | Admitting: Cardiology

## 2019-07-20 NOTE — Telephone Encounter (Signed)
I will forward to Dr.branch, labs in Media

## 2019-07-20 NOTE — Telephone Encounter (Signed)
Scanned labs from Dr. Marcelo Baldy office for Dr. Wyline Mood

## 2019-08-24 ENCOUNTER — Other Ambulatory Visit: Payer: Self-pay | Admitting: Cardiology

## 2019-12-27 ENCOUNTER — Ambulatory Visit: Payer: BC Managed Care – PPO | Admitting: Cardiology

## 2019-12-27 ENCOUNTER — Other Ambulatory Visit: Payer: Self-pay

## 2019-12-27 ENCOUNTER — Encounter: Payer: Self-pay | Admitting: Cardiology

## 2019-12-27 VITALS — BP 108/62 | HR 77 | Ht 64.0 in | Wt 239.0 lb

## 2019-12-27 DIAGNOSIS — R002 Palpitations: Secondary | ICD-10-CM | POA: Diagnosis not present

## 2019-12-27 DIAGNOSIS — I5032 Chronic diastolic (congestive) heart failure: Secondary | ICD-10-CM

## 2019-12-27 NOTE — Patient Instructions (Signed)
Medication Instructions:  Your physician recommends that you continue on your current medications as directed. Please refer to the Current Medication list given to you today.  *If you need a refill on your cardiac medications before your next appointment, please call your pharmacy*   Lab Work: None today If you have labs (blood work) drawn today and your tests are completely normal, you will receive your results only by: . MyChart Message (if you have MyChart) OR . A paper copy in the mail If you have any lab test that is abnormal or we need to change your treatment, we will call you to review the results.   Testing/Procedures: None today   Follow-Up: At [facility], you and your health needs are our priority.  As part of our continuing mission to provide you with exceptional heart care, we have created designated Provider Care Teams.  These Care Teams include your primary Cardiologist (physician) and Advanced Practice Providers (APPs -  Physician Assistants and Nurse Practitioners) who all work together to provide you with the care you need, when you need it.  We recommend signing up for the patient portal called "MyChart".  Sign up information is provided on this After Visit Summary.  MyChart is used to connect with patients for Virtual Visits (Telemedicine).  Patients are able to view lab/test results, encounter notes, upcoming appointments, etc.  Non-urgent messages can be sent to your provider as well.   To learn more about what you can do with MyChart, go to [email or web address].    Your next appointment:   6 month(s)  The format for your next appointment:   In Person  Provider:   Jonathan Branch, MD   Other Instructions None       Thank you for choosing [facility] !         

## 2019-12-27 NOTE — Progress Notes (Signed)
Clinical Summary Ms. Coomer is a 64 y.o.female seen today for follow up of the following medical problems.   1. LE edema/Chronic diastolic HF -2017 echo with LVEF 60-65%, grade II diastolic dysfunction - limiting sodium intake.Takes lasix as needed.   12/2018 echo LVEF 55-60%, indet diastolic function, normal RV  - some recent increase in SOB/DOE.  - no recent edema. Takes lasix 40mg  daily.     2. Chest pain - started about 2 months,4 episodes - last episode while driving on Monday. Woke up that morning mild pain under left breast.  - sharp pain midchest to left chest pain, 10/10 in severity. She reports chronic SOB, unsure if worst during episode. Feeling of heart racing.  - pain lasted 3 minutes, resolved on its own. - other episodes often at night  - has been walking her dog x 0.5 miles a few times a day, sometimes can have some SOB with it  CAD risk factors: HTN, HL     3. Palpitations - heart monitor 10/2015 with SR, sinus tach and occasional PVCs -no recent significant symptoms.    4. OSA screen - previously referred to Dr 11/2015 - has not been able to see - + snoring, unsure apneic of episodes, prior daytime somnolence that is resolved.    5. 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 Has 2 foreign students living with herthat just moved back, awaiting more exchange students after the summer 3 dogs 2 exchanged students at hte house, age 13 and 25.     Past Medical History:  Diagnosis Date  . Hyperlipidemia   . Hypertension   . Psoriasis    SCALP  . Spinal stenosis      Allergies  Allergen Reactions  . Sulfa Antibiotics Rash     Current Outpatient Medications  Medication Sig Dispense Refill  . Ascorbic Acid (VITA-C PO) Take by mouth.    12 aspirin 325 MG tablet Take 325 mg by mouth daily.    . Biotin 10  MG CAPS Take 1 capsule by mouth daily.    Marland Kitchen buPROPion (WELLBUTRIN XL) 150 MG 24 hr tablet     . CALCIUM-VITAMIN D PO Take 1 tablet by mouth daily. Calcium w/ Vitamin D3 5,000 units    . clobetasol (TEMOVATE) 0.05 % external solution Apply 1 application topically as needed.     . diclofenac (VOLTAREN) 75 MG EC tablet Take 1 tablet (75 mg total) by mouth 2 (two) times daily. For muscle and  Joint pain 60 tablet 2  . docusate sodium (COLACE) 100 MG capsule Take 100 mg by mouth at bedtime as needed for mild constipation.    . DULoxetine (CYMBALTA) 60 MG capsule     . ferrous sulfate 325 (65 FE) MG EC tablet Take 325 mg by mouth daily.    . furosemide (LASIX) 40 MG tablet Take 40 mg daily, may take additional 20 mg for swelling 100 tablet 3  . gabapentin (NEURONTIN) 300 MG capsule Take 300 mg by mouth 3 (three) times daily.     . Glucosamine-Chondroitin 1500-1200 MG/30ML LIQD Take 1 capsule by mouth daily.    Marland Kitchen levothyroxine (SYNTHROID) 75 MCG tablet     . metoprolol tartrate (LOPRESSOR) 25 MG tablet TAKE 1/2 TABLET BY MOUTH 2 TIMES A DAY. 90 tablet 1  . nitroGLYCERIN (NITROSTAT) 0.4 MG SL tablet Place 1  tablet (0.4 mg total) under the tongue every 5 (five) minutes as needed for chest pain. 25 tablet 3  . olmesartan (BENICAR) 20 MG tablet     . Omega-3 Fatty Acids (FISH OIL PO) Take 1 capsule by mouth daily.    Marland Kitchen omeprazole (PRILOSEC) 40 MG capsule Take 40 mg by mouth daily.     . potassium chloride SA (KLOR-CON) 20 MEQ tablet TAKE ONE TABLET BY MOUTH DAILY WHILE TAKING FUROSEMIDE. 90 tablet 2  . tiZANidine (ZANAFLEX) 4 MG tablet      No current facility-administered medications for this visit.     Past Surgical History:  Procedure Laterality Date  . BREAST EXCISIONAL BIOPSY Right 2001   negative  . BREAST EXCISIONAL BIOPSY Right 2002   negative     Allergies  Allergen Reactions  . Sulfa Antibiotics Rash      Family History  Problem Relation Age of Onset  . Breast cancer Sister  29  . Breast cancer Maternal Aunt 70  . Breast cancer Maternal Aunt 70  . Breast cancer Maternal Aunt 80  . Breast cancer Maternal Aunt 70  . Hypertension Mother      Social History Ms. Bender reports that she has never smoked. She has never used smokeless tobacco. Ms. Mesmer reports current alcohol use of about 1.0 standard drink of alcohol per week.   Review of Systems CONSTITUTIONAL: No weight loss, fever, chills, weakness or fatigue.  HEENT: Eyes: No visual loss, blurred vision, double vision or yellow sclerae.No hearing loss, sneezing, congestion, runny nose or sore throat.  SKIN: No rash or itching.  CARDIOVASCULAR: per hpi RESPIRATORY: per hpi GASTROINTESTINAL: No anorexia, nausea, vomiting or diarrhea. No abdominal pain or blood.  GENITOURINARY: No burning on urination, no polyuria NEUROLOGICAL: No headache, dizziness, syncope, paralysis, ataxia, numbness or tingling in the extremities. No change in bowel or bladder control.  MUSCULOSKELETAL: No muscle, back pain, joint pain or stiffness.  LYMPHATICS: No enlarged nodes. No history of splenectomy.  PSYCHIATRIC: No history of depression or anxiety.  ENDOCRINOLOGIC: No reports of sweating, cold or heat intolerance. No polyuria or polydipsia.  Marland Kitchen   Physical Examination Today's Vitals   12/27/19 1448  BP: 108/62  Pulse: 77  SpO2: 97%  Weight: 239 lb (108.4 kg)  Height: 5\' 4"  (1.626 m)   Body mass index is 41.02 kg/m.  Gen: resting comfortably, no acute distress HEENT: no scleral icterus, pupils equal round and reactive, no palptable cervical adenopathy,  CV: RRR, no m/r/g, no jvd Resp: Clear to auscultation bilaterally GI: abdomen is soft, non-tender, non-distended, normal bowel sounds, no hepatosplenomegaly MSK: extremities are warm, no edema.  Skin: warm, no rash Neuro:  no focal deficits Psych: appropriate affect   Diagnostic Studies 10/2015 echo Study Conclusions  - Left ventricle: The cavity size was  normal. Wall thickness was increased in a pattern of mild LVH. Systolic function was normal. The estimated ejection fraction was in the range of 60% to 65%. Wall motion was normal; there were no regional wall motion abnormalities. Features are consistent with a pseudonormal left ventricular filling pattern, with concomitant abnormal relaxation and increased filling pressure (grade 2 diastolic dysfunction). - Aortic valve: Mildly calcified annulus. Trileaflet. There was very mild stenosis. Peak velocity (S): 200 cm/s. Mean gradient (S): 8 mm Hg. Valve area (VTI): 1.69 cm^2. Valve area (Vmax): 1.72 cm^2. Valve area (Vmean): 1.79 cm^2. - Mitral valve: Calcified annulus.  10/2015 Heart monitor  Telemetry tracings show sinus rhythm  Reported symptoms correlate  with sinus rhythm and sinus tachycardia with occasional PVCs  No significant arrhythmias  EKG in clinic today shows SR, LAFB.   09/2017 nuclear stress  Blood pressure demonstrated a hypertensive response to exercise.  There was no ST segment deviation noted during stress. Diffuse nonspecific T wave abnormalities seen throughout study.  The study is normal. No myocardial ischemia or scar. Rest images affected by artifact.  This is a low risk study.  Nuclear stress EF: 66%.   12/2018 echo IMPRESSIONS   1. Left ventricular ejection fraction, by visual estimation, is 55 to 60%. The left ventricle has normal function. There is mildly increased left ventricular hypertrophy. 2. Left ventricular diastolic Doppler parameters are indeterminate pattern of LV diastolic filling. 3. Global right ventricle has normal systolic function.The right ventricular size is normal. No increase in right ventricular wall thickness. 4. Left atrial size was moderately dilated. 5. Right atrial size was normal. 6. The mitral valve is normal in structure. No evidence of mitral valve regurgitation. No evidence of mitral  stenosis. 7. The tricuspid valve is not well visualized. Tricuspid valve regurgitation is trivial. 8. The aortic valve has an indeterminant number of cusps Aortic valve regurgitation was not visualized by color flow Doppler. Structurally normal aortic valve, with no evidence of sclerosis or stenosis. 9. The pulmonic valve was not well visualized. Pulmonic valve regurgitation is not visualized by color flow Doppler. 10. The inferior vena cava is normal in size with <50% respiratory variability, suggesting right atrial pressure of 8 mmHg.     Assessment and Plan  1 Chronic diastolic HF - continue oral lasix, counseled may take additional 20mg  as needed for swelling or SOB  2. Palpitations - doing well, continue to monitor     , M.D.

## 2019-12-30 DIAGNOSIS — R32 Unspecified urinary incontinence: Secondary | ICD-10-CM | POA: Insufficient documentation

## 2019-12-30 DIAGNOSIS — M5136 Other intervertebral disc degeneration, lumbar region: Secondary | ICD-10-CM | POA: Insufficient documentation

## 2019-12-30 DIAGNOSIS — M533 Sacrococcygeal disorders, not elsewhere classified: Secondary | ICD-10-CM | POA: Insufficient documentation

## 2020-01-06 ENCOUNTER — Ambulatory Visit: Payer: BC Managed Care – PPO | Admitting: Urology

## 2020-01-20 ENCOUNTER — Encounter: Payer: Self-pay | Admitting: Urology

## 2020-01-20 ENCOUNTER — Ambulatory Visit (INDEPENDENT_AMBULATORY_CARE_PROVIDER_SITE_OTHER): Payer: BC Managed Care – PPO | Admitting: Urology

## 2020-01-20 ENCOUNTER — Other Ambulatory Visit: Payer: Self-pay

## 2020-01-20 VITALS — BP 127/64 | HR 94 | Temp 98.4°F | Ht 64.0 in | Wt 239.0 lb

## 2020-01-20 DIAGNOSIS — N3281 Overactive bladder: Secondary | ICD-10-CM | POA: Diagnosis not present

## 2020-01-20 DIAGNOSIS — N3946 Mixed incontinence: Secondary | ICD-10-CM

## 2020-01-20 DIAGNOSIS — N3642 Intrinsic sphincter deficiency (ISD): Secondary | ICD-10-CM | POA: Diagnosis not present

## 2020-01-20 DIAGNOSIS — R32 Unspecified urinary incontinence: Secondary | ICD-10-CM | POA: Diagnosis not present

## 2020-01-20 LAB — URINALYSIS, ROUTINE W REFLEX MICROSCOPIC
Bilirubin, UA: NEGATIVE
Ketones, UA: NEGATIVE
Leukocytes,UA: NEGATIVE
Nitrite, UA: NEGATIVE
Protein,UA: NEGATIVE
RBC, UA: NEGATIVE
Specific Gravity, UA: 1.02 (ref 1.005–1.030)
Urobilinogen, Ur: 0.2 mg/dL (ref 0.2–1.0)
pH, UA: 5.5 (ref 5.0–7.5)

## 2020-01-20 LAB — BLADDER SCAN AMB NON-IMAGING: Scan Result: 17

## 2020-01-20 MED ORDER — GEMTESA 75 MG PO TABS: 75.0000 mg | ORAL_TABLET | Freq: Every day | ORAL | 11 refills | Status: DC

## 2020-01-20 NOTE — Progress Notes (Signed)
PVR =17   Urological Symptom Review  Patient is experiencing the following symptoms: Frequent urination Hard to postpone urination Get up at night to urinate Leakage of urine   Review of Systems  Gastrointestinal (upper)  : Negative for upper GI symptoms  Gastrointestinal (lower) : Negative for lower GI symptoms  Constitutional : Fatigue  Skin: Itching  Eyes: Negative for eye symptoms  Ear/Nose/Throat : Negative for Ear/Nose/Throat symptoms  Hematologic/Lymphatic: Easy bruising  Cardiovascular : Leg swelling Chest pain Negative for cardiovascular symptoms  Respiratory : Shortness of breath  Endocrine: Excessive thirst  Musculoskeletal: Back pain Joint pain  Neurological: Negative for neurological symptoms  Psychologic: Depression

## 2020-01-20 NOTE — Progress Notes (Signed)
Subjective: 1. Mixed stress and urge urinary incontinence   2. Overactive bladder   3. Intrinsic sphincter deficiency      Virginia Marks is a 64 yo female who is sent in consultation by Dr. Margo AyeHall for a several year history of progressive urge incontinence.  She was given Gemtesa 75mg  and it worked really well but was too expensive.  She had not been tried on any other meds.   She has a PVR of 17ml.  She has had no history of UTI's, stones or GU surgery.  She wears about 4ppd.   She has frequency and nocturia x 2.  She has had no hematuria or dysuria.   She has a history of CHF and takes lasix for that.    She is on [place]Farxiga for diabetes.   She has 2+ glucose in the urine today.  Her Cr was 0.83 in 12/20.    ROS:  ROS  Allergies  Allergen Reactions  . Sulfa Antibiotics Rash    Past Medical History:  Diagnosis Date  . Acid reflux   . Depression   . Diabetes (HCC)   . Gout   . Heart disease   . High cholesterol   . Hyperlipidemia   . Hypertension   . Hypothyroidism   . Psoriasis    SCALP  . Sleep apnea   . Spinal stenosis     Past Surgical History:  Procedure Laterality Date  .  back surgery    . ABDOMINAL HYSTERECTOMY    . BREAST EXCISIONAL BIOPSY Right 2001   negative  . BREAST EXCISIONAL BIOPSY Right 2002   negative  . CHOLECYSTECTOMY    . ROTATOR CUFF REPAIR  2015    Social History   Socioeconomic History  . Marital status: Divorced    Spouse name: Not on file  . Number of children: Not on file  . Years of education: Not on file  . Highest education level: Not on file  Occupational History  . Occupation: Runner, broadcasting/film/videoteacher  Tobacco Use  . Smoking status: Never Smoker  . Smokeless tobacco: Never Used  Vaping Use  . Vaping Use: Never used  Substance and Sexual Activity  . Alcohol use: Yes    Alcohol/week: 1.0 standard drink    Types: 1 Cans of beer per week  . Drug use: Never  . Sexual activity: Not Currently  Other Topics Concern  . Not on file  Social History  Narrative  . Not on file   Social Determinants of Health   Financial Resource Strain:   . Difficulty of Paying Living Expenses: Not on file  Food Insecurity:   . Worried About Programme researcher, broadcasting/film/videounning Out of Food in the Last Year: Not on file  . Ran Out of Food in the Last Year: Not on file  Transportation Needs:   . Lack of Transportation (Medical): Not on file  . Lack of Transportation (Non-Medical): Not on file  Physical Activity:   . Days of Exercise per Week: Not on file  . Minutes of Exercise per Session: Not on file  Stress:   . Feeling of Stress : Not on file  Social Connections:   . Frequency of Communication with Friends and Family: Not on file  . Frequency of Social Gatherings with Friends and Family: Not on file  . Attends Religious Services: Not on file  . Active Member of Clubs or Organizations: Not on file  . Attends BankerClub or Organization Meetings: Not on file  . Marital Status: Not on  file  Intimate Partner Violence:   . Fear of Current or Ex-Partner: Not on file  . Emotionally Abused: Not on file  . Physically Abused: Not on file  . Sexually Abused: Not on file    Family History  Problem Relation Age of Onset  . Breast cancer Sister 47  . Breast cancer Maternal Aunt 70  . Breast cancer Maternal Aunt 70  . Breast cancer Maternal Aunt 80  . Breast cancer Maternal Aunt 70  . Hypertension Mother   . Pulmonary fibrosis Father     Anti-infectives: Anti-infectives (From admission, onward)   None      Current Outpatient Medications  Medication Sig Dispense Refill  . Biotin 10 MG CAPS Take 1 capsule by mouth daily.    Marland Kitchen buPROPion (WELLBUTRIN XL) 150 MG 24 hr tablet Take 150 mg by mouth daily.     Marland Kitchen CALCIUM-VITAMIN D PO Take 1 tablet by mouth daily. Calcium w/ Vitamin D3 5,000 units    . dapagliflozin propanediol (FARXIGA) 10 MG TABS tablet     . diclofenac (VOLTAREN) 75 MG EC tablet Take 1 tablet (75 mg total) by mouth 2 (two) times daily. For muscle and  Joint pain 60  tablet 2  . docusate sodium (COLACE) 100 MG capsule Take 100 mg by mouth at bedtime as needed for mild constipation.    . DULoxetine (CYMBALTA) 60 MG capsule Take 60 mg by mouth daily.     . folic acid (FOLVITE) 1 MG tablet Take 1 mg by mouth daily.    . furosemide (LASIX) 40 MG tablet Take 40 mg daily, may take additional 20 mg for swelling 100 tablet 3  . gabapentin (NEURONTIN) 300 MG capsule Take 300 mg by mouth 3 (three) times daily.     . Glucosamine-Chondroitin 1500-1200 MG/30ML LIQD Take 1 capsule by mouth daily.    Marland Kitchen levothyroxine (SYNTHROID) 75 MCG tablet Take 75 mcg by mouth daily before breakfast.     . methotrexate (RHEUMATREX) 2.5 MG tablet Take 15 mg by mouth once a week.    . metoprolol tartrate (LOPRESSOR) 25 MG tablet TAKE 1/2 TABLET BY MOUTH 2 TIMES A DAY. 90 tablet 1  . olmesartan (BENICAR) 20 MG tablet Take 20 mg by mouth at bedtime.     . Omega-3 Fatty Acids (FISH OIL PO) Take 1 capsule by mouth daily.    Marland Kitchen omeprazole (PRILOSEC) 40 MG capsule Take 40 mg by mouth daily.     . potassium chloride SA (KLOR-CON) 20 MEQ tablet TAKE ONE TABLET BY MOUTH DAILY WHILE TAKING FUROSEMIDE. 90 tablet 2  . pravastatin (PRAVACHOL) 20 MG tablet Take 20 mg by mouth daily.    . clobetasol (TEMOVATE) 0.05 % external solution Apply 1 application topically as needed.  (Patient not taking: Reported on 12/27/2019)    . nitroGLYCERIN (NITROSTAT) 0.4 MG SL tablet Place 1 tablet (0.4 mg total) under the tongue every 5 (five) minutes as needed for chest pain. 25 tablet 3  . Vibegron (GEMTESA) 75 MG TABS Take 75 mg by mouth daily. 30 tablet 11   No current facility-administered medications for this visit.     Objective: Vital signs in last 24 hours: BP 127/64   Pulse 94   Temp 98.4 F (36.9 C)   Ht 5\' 4"  (1.626 m)   Wt 239 lb (108.4 kg)   BMI 41.02 kg/m   Intake/Output from previous day: No intake/output data recorded. Intake/Output this shift: @IOTHISSHIFT @   Physical Exam Vitals  reviewed.  Constitutional:      Appearance: Normal appearance. She is obese.  Abdominal:     Palpations: Abdomen is soft. There is no mass.     Tenderness: There is no abdominal tenderness.     Hernia: No hernia is present.  Genitourinary:    Comments: Nl External genitalia. Moderate introital stenosis with moderate to severe vaginal mucosal atrophy No cystocele or rectocele. Urethral Meatus has mild stenosis and she leaks readily with coughing.   Uterus and cervix were not palpable. No adnexal masses.  Musculoskeletal:        General: No swelling or tenderness. Normal range of motion.  Skin:    General: Skin is dry.  Neurological:     General: No focal deficit present.     Mental Status: She is alert and oriented to person, place, and time.  Psychiatric:        Mood and Affect: Mood normal.        Behavior: Behavior normal.     Lab Results:  Results for orders placed or performed in visit on 01/20/20 (from the past 24 hour(s))  Urinalysis, Routine w reflex microscopic     Status: Abnormal   Collection Time: 01/20/20  3:24 PM  Result Value Ref Range   Specific Gravity, UA 1.020 1.005 - 1.030   pH, UA 5.5 5.0 - 7.5   Color, UA Yellow Yellow   Appearance Ur Clear Clear   Leukocytes,UA Negative Negative   Protein,UA Negative Negative/Trace   Glucose, UA 2+ (A) Negative   Ketones, UA Negative Negative   RBC, UA Negative Negative   Bilirubin, UA Negative Negative   Urobilinogen, Ur 0.2 0.2 - 1.0 mg/dL   Nitrite, UA Negative Negative   Microscopic Examination Comment    Narrative   Performed at:  [address] - [facility] [address], [place], [place]  [id number] Lab Director: Chinita Pester MT, Phone:  [phone]    BMET No results for input(s): NA, K, CL, CO2, GLUCOSE, BUN, CREATININE, CALCIUM in the last 72 hours. PT/INR No results for input(s): LABPROT, INR in the last 72 hours. ABG No results for input(s): PHART, HCO3 in the last 72 hours.  Invalid  input(s): PCO2, PO2  Studies/Results: PVR 59ml     Assessment/Plan: OAB with urge predominant MUI.   She did well with Leslye Peer but it was not initially approved by her insurance.  I have given her additional samples and sent a new script.  She is a poor candidate for anticholinergics with her history of CHF and she is on meds with moderate interaction risks with Myrbetriq so the Leslye Peer is the safest and most appropriate option.    Intrinsic sphincter deficiency with SUI.  Despite her urge predominant incontinence she has exam findings consistent with ISD.   She may be a candidate for the Bulkamid procedure.  I will discuss a referral for that procedure.     Meds ordered this encounter  Medications  . Vibegron (GEMTESA) 75 MG TABS    Sig: Take 75 mg by mouth daily.    Dispense:  30 tablet    Refill:  11    She has CHF and shouldn't have anticholinergics and Myrbetriq has a moderate interaction warning with metoprolol and duloxetine.     Orders Placed This Encounter  Procedures  . Urinalysis, Routine w reflex microscopic  . Ambulatory referral to Urology    Referral Priority:   Routine    Referral Type:   Consultation    Referral  Reason:   Specialty Services Required    Referred to Provider:   Noel Christmas, MD    Requested Specialty:   Urology    Number of Visits Requested:   1  . BLADDER SCAN AMB NON-IMAGING     Return in about 3 months (around 04/21/2020).    CC: Dr. Benita Stabile.      Bjorn Pippin 01/20/2020 [phone]

## 2020-03-07 ENCOUNTER — Other Ambulatory Visit: Payer: Self-pay | Admitting: Urology

## 2020-03-22 ENCOUNTER — Telehealth: Payer: Self-pay

## 2020-03-22 NOTE — Telephone Encounter (Signed)
Pt called asking about getting a PA for [place]. I called [employer] and her PA was approved from 11/2019-11/2020.  I called patient and informed her insurance approved her medication. Pt states pharmacy said medication was still $400. I did tell the patient the insurance will send over the PA again and we would send to pharmacy. I also offered pt to go on [place] website and apply for drug savings card. Pt voiced understanding.

## 2020-03-23 ENCOUNTER — Other Ambulatory Visit (HOSPITAL_COMMUNITY)
Admission: RE | Admit: 2020-03-23 | Discharge: 2020-03-23 | Disposition: A | Discharge status: Home / Self Care | Payer: BC Managed Care – PPO | Source: Ambulatory Visit | Attending: Urology | Admitting: Urology

## 2020-03-23 DIAGNOSIS — Z20822 Contact with and (suspected) exposure to covid-19: Secondary | ICD-10-CM | POA: Insufficient documentation

## 2020-03-23 DIAGNOSIS — Z01812 Encounter for preprocedural laboratory examination: Secondary | ICD-10-CM | POA: Insufficient documentation

## 2020-03-24 LAB — SARS CORONAVIRUS 2 (TAT 6-24 HRS): SARS Coronavirus 2: NEGATIVE

## 2020-03-26 ENCOUNTER — Encounter (HOSPITAL_BASED_OUTPATIENT_CLINIC_OR_DEPARTMENT_OTHER): Payer: Self-pay | Admitting: Urology

## 2020-03-26 ENCOUNTER — Other Ambulatory Visit: Payer: Self-pay

## 2020-03-26 NOTE — H&P (Signed)
CC/HPI: cc: Mixed urinary incontinence   02/29/20: 64 year old woman with mixed urinary incontinence referred by Dr. Annabell Howells. Patient underwent urodynamics which showed positive stress urodynamics with moderate leakage. She did not have significant detrusor overactivity but was on gemtesa at the time. Patient is waiting for insurance approval for the gemtesa and has since run out. 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 She is wearing 4-5 size 7 pads daily. She has episodes of complete incontinence. She has had hysterectomy in the past.   UDS SUMMARY  Ms. Dikes held a max capacity of approx. 370 mls. Her 1st sensation was felt at 89 mls. There was positive SUI with moderate leakage. No obvious instability was noted during the study (pt currently on Gemtesa). She does describe episodes of high volume leakage when going from a seated to a standing position. She was able to generate a voluntary contraction and void. Increased EMG activity was noted during voiding. PVR was approx. 50 mls. Mild trabeculation was noted. No reflux was seen.     ALLERGIES: sulfa    MEDICATIONS: None   GU PSH: Complex cystometrogram, w/ void pressure and urethral pressure profile studies, any technique - 02/09/2020 Complex Uroflow - 02/09/2020 Emg surf Electrd - 02/09/2020 Inject For cystogram - 02/09/2020 Intrabd voidng Press - 02/09/2020     NON-GU PSH: None   GU PMH: Mixed incontinence - 02/09/2020 Urinary Frequency - 02/09/2020 Urinary Urgency - 02/09/2020    NON-GU PMH: None   FAMILY HISTORY: None   SOCIAL HISTORY: None   REVIEW OF SYSTEMS:    GU Review Female:   Patient denies frequent urination, hard to postpone urination, burning /pain with urination, get up at night to urinate, leakage of urine, stream starts and stops, trouble starting your stream, have to strain to urinate, and being pregnant.  Gastrointestinal (Upper):   Patient denies nausea, vomiting, and indigestion/  heartburn.  Gastrointestinal (Lower):   Patient denies diarrhea and constipation.  Constitutional:   Patient denies fever, night sweats, weight loss, and fatigue.  Skin:   Patient denies skin rash/ lesion and itching.  Eyes:   Patient denies blurred vision and double vision.  Ears/ Nose/ Throat:   Patient denies sore throat and sinus problems.  Hematologic/Lymphatic:   Patient denies swollen glands and easy bruising.  Cardiovascular:   Patient denies chest pains and leg swelling.  Respiratory:   Patient denies cough and shortness of breath.  Endocrine:   Patient denies excessive thirst.  Musculoskeletal:   Patient denies back pain and joint pain.  Neurological:   Patient denies headaches and dizziness.  Psychologic:   Patient denies depression and anxiety.   VITAL SIGNS:      02/29/2020 10:01 AM  Weight 231 lb / 104.78 kg  Height 64 in / 162.56 cm  BP 125/63 mmHg  Pulse 74 /min  Temperature 97.1 F / 36.1 C  BMI 39.6 kg/m   GU PHYSICAL EXAMINATION:    External Genitalia: No hirsutism, no rash, no scarring, no cyst, no erythematous lesion, no papular lesion, no blanched lesion, no warty lesion. No edema.  Urethral Meatus: Normal size. Normal position. No discharge.   Vagina: Mild vaginal atrophy. No stenosis. No significant pelvic organ prolapse. + SUI with cough in dorsal lithotomy.   MULTI-SYSTEM PHYSICAL EXAMINATION:    Constitutional: Well-nourished. No physical deformities. Normally developed. Good grooming.  Neck: Neck symmetrical, not swollen. Normal tracheal position.  Respiratory: No labored breathing, no use of accessory muscles.  Cardiovascular: Normal temperature  Skin: No paleness, no jaundice, no cyanosis. No lesion, no ulcer, no rash.  Neurologic / Psychiatric: Oriented to time, oriented to place, oriented to person. No depression, no anxiety, no agitation.  Gastrointestinal: No rigidity  Eyes: Normal conjunctivae. Normal eyelids.  Ears, Nose, Mouth, and Throat:  Left ear no scars, no lesions, no masses. Right ear no scars, no lesions, no masses. Nose no scars, no lesions, no masses. Normal hearing. Normal lips.  Musculoskeletal: Normal gait and station of head and neck.     Complexity of Data:  Records Review:   Previous Patient Records, POC Tool  Urine Test Review:   Urinalysis  Urodynamics Review:   Review Urodynamics Tests  Notes:                     01/27/2020: BUN 20, creatinine 0.85   PROCEDURES:          Urinalysis Dipstick Dipstick Cont'd  Color: Yellow Bilirubin: Neg mg/dL  Appearance: Clear Ketones: Neg mg/dL  Specific Gravity: 6.144 Blood: Neg ery/uL  pH: 5.5 Protein: Neg mg/dL  Glucose: 3+ mg/dL Urobilinogen: 0.2 mg/dL    Nitrites: Neg    Leukocyte Esterase: Neg leu/uL    ASSESSMENT:      ICD-10 Details  1 GU:   Mixed incontinence - N39.46 Chronic, Worsening  2   Urinary Frequency - R35.0 Chronic, Stable  3   Urinary Urgency - R39.15 Chronic, Stable  4   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 Given the severity of her symptoms we discussed treatment options including mid urethral sling vs urethral bulking agent. I also offered patient pelvic floor physical therapy as conservative measure 1st. Patient is extremely bothered by the stress urinary continence and would like to proceed with mid urethral sling placement which is reasonable given how much leakage is occurring. The risks and benefits of a lynx mid urethral sling were discussed with the patient. These include but are not limited to UTI, retention, mesh erosion/estrusion,  pain, need for mesh removal, failure, need for future treatment, injury to adjacent structures. I would like for patient to begin using vaginal estrogen cream now to help the integrity of her vaginal mucosa prior to the sling procedure. 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 We discussed postoperative limitations including no heavy lifting, strenuous activity or sexual activity for 6 weeks following the sling placement.   We also discussed that she will likely need to remain on gemtesa as she has evidence of both urge and stress incontinence.   cc: Bjorn Pippin, MD  cc: Marquis Lunch, MD

## 2020-03-26 NOTE — Progress Notes (Signed)
Spoke w/ via phone for pre-op interview--- PT Lab needs dos---- Istat              Lab results------ current ekg in epic/ chart COVID test ------ done 03-23-2020 negative results in epic Arrive at ------- 0700 NPO after MN NO Solid Food.  Clear liquids from MN until--- 0600 Medications to take morning of surgery ----- Gabapentin, Wellbutrin, Lopressor, Synthroid, Prilosec Diabetic medication ----- take diabetic medication as ususal night before surgery Patient Special Instructions ----- n/a Pre-Op special Istructions ----- n/a Patient verbalized understanding of instructions that were given at this phone interview. Patient denies shortness of breath, chest pain, fever, cough at this phone interview.   Anesthesia :  HTN;  Chronic diastolic dhf;  OSA w/ cpap;  DM2; Psoriatic arthritis.  Pt denies any cardiac s&s, sob, and no peripheral swelling.  Stated last took nitro approx. 5 months ago.  PCP: Dr Pollyann Kennedy Cardiologist : Dr Shela Commons. Wyline Mood (lov 12-27-2019 epic) Rheumatologist:  Dr Dierdre Forth Chest x-ray :  01-14-2019 epic EKG : 01-03-2020 epic Echo : 01-05-2019 epic Stress test: 09-25-2017 epic Cardiac Cath :   no Activity level:  States occasional sob w/ exertion Sleep Study/ CPAP :  YES/ YES Fasting Blood Sugar :      / Checks Blood Sugar -- times a day:   Stated does not check at home Blood Thinner/ Instructions Maurice Small Dose:  NO ASA / Instructions/ Last Dose :  NO

## 2020-03-27 ENCOUNTER — Encounter (HOSPITAL_BASED_OUTPATIENT_CLINIC_OR_DEPARTMENT_OTHER)
Admission: RE | Disposition: A | Discharge status: Home / Self Care | Payer: Self-pay | Source: Ambulatory Visit | Attending: Urology

## 2020-03-27 ENCOUNTER — Ambulatory Visit (HOSPITAL_BASED_OUTPATIENT_CLINIC_OR_DEPARTMENT_OTHER): Payer: BC Managed Care – PPO | Admitting: Anesthesiology

## 2020-03-27 ENCOUNTER — Other Ambulatory Visit: Payer: Self-pay

## 2020-03-27 ENCOUNTER — Ambulatory Visit (HOSPITAL_BASED_OUTPATIENT_CLINIC_OR_DEPARTMENT_OTHER)
Admission: RE | Admit: 2020-03-27 | Discharge: 2020-03-27 | Disposition: A | Discharge status: Home / Self Care | Payer: BC Managed Care – PPO | Source: Ambulatory Visit | Attending: Urology | Admitting: Urology

## 2020-03-27 ENCOUNTER — Encounter (HOSPITAL_BASED_OUTPATIENT_CLINIC_OR_DEPARTMENT_OTHER): Payer: Self-pay | Admitting: Urology

## 2020-03-27 DIAGNOSIS — N3946 Mixed incontinence: Secondary | ICD-10-CM | POA: Diagnosis not present

## 2020-03-27 DIAGNOSIS — N952 Postmenopausal atrophic vaginitis: Secondary | ICD-10-CM | POA: Diagnosis not present

## 2020-03-27 DIAGNOSIS — N393 Stress incontinence (female) (male): Secondary | ICD-10-CM

## 2020-03-27 DIAGNOSIS — Z882 Allergy status to sulfonamides status: Secondary | ICD-10-CM | POA: Diagnosis not present

## 2020-03-27 HISTORY — DX: Type 2 diabetes mellitus without complications: E11.9

## 2020-03-27 HISTORY — DX: Localized edema: R60.0

## 2020-03-27 HISTORY — PX: CYSTOSCOPY: SHX5120

## 2020-03-27 HISTORY — DX: Arthropathic psoriasis, unspecified: L40.50

## 2020-03-27 HISTORY — DX: Overactive bladder: N32.81

## 2020-03-27 HISTORY — DX: Obstructive sleep apnea (adult) (pediatric): G47.33

## 2020-03-27 HISTORY — DX: Dependence on other enabling machines and devices: Z99.89

## 2020-03-27 HISTORY — DX: Presence of external hearing-aid: Z97.4

## 2020-03-27 HISTORY — DX: Postlaminectomy syndrome, not elsewhere classified: M96.1

## 2020-03-27 HISTORY — PX: PUBOVAGINAL SLING: SHX1035

## 2020-03-27 HISTORY — DX: Gastro-esophageal reflux disease without esophagitis: K21.9

## 2020-03-27 HISTORY — DX: Stress incontinence (female) (male): N39.3

## 2020-03-27 LAB — POCT I-STAT, CHEM 8
BUN: 23 mg/dL (ref 8–23)
Calcium, Ion: 1.08 mmol/L — ABNORMAL LOW (ref 1.15–1.40)
Chloride: 106 mmol/L (ref 98–111)
Creatinine, Ser: 0.8 mg/dL (ref 0.44–1.00)
Glucose, Bld: 137 mg/dL — ABNORMAL HIGH (ref 70–99)
HCT: 46 % (ref 36.0–46.0)
Hemoglobin: 15.6 g/dL — ABNORMAL HIGH (ref 12.0–15.0)
Potassium: 4 mmol/L (ref 3.5–5.1)
Sodium: 137 mmol/L (ref 135–145)
TCO2: 21 mmol/L — ABNORMAL LOW (ref 22–32)

## 2020-03-27 LAB — GLUCOSE, CAPILLARY: Glucose-Capillary: 153 mg/dL — ABNORMAL HIGH (ref 70–99)

## 2020-03-27 SURGERY — CREATION, PUBOVAGINAL SLING
Anesthesia: General | Site: Vagina

## 2020-03-27 MED ORDER — ESTRADIOL 0.1 MG/GM VA CREA
TOPICAL_CREAM | VAGINAL | Status: DC | PRN
  Administered 2020-03-27: 1 via VAGINAL

## 2020-03-27 MED ORDER — CEPHALEXIN 500 MG PO CAPS: 500.0000 mg | ORAL_CAPSULE | Freq: Four times a day (QID) | ORAL | 0 refills | Status: AC

## 2020-03-27 MED ORDER — ACETAMINOPHEN 10 MG/ML IV SOLN: 1000.0000 mg | Freq: Once | INTRAVENOUS | Status: DC | PRN

## 2020-03-27 MED ORDER — TRAMADOL HCL 50 MG PO TABS: 50.0000 mg | ORAL_TABLET | Freq: Four times a day (QID) | ORAL | 0 refills | Status: AC | PRN

## 2020-03-27 MED ORDER — OXYBUTYNIN CHLORIDE 5 MG PO TABS
ORAL_TABLET | ORAL | Status: AC
  Filled 2020-03-27: qty 1

## 2020-03-27 MED ORDER — LIDOCAINE-EPINEPHRINE (PF) 1 %-1:200000 IJ SOLN
INTRAMUSCULAR | Status: DC | PRN
  Administered 2020-03-27: 10 mL
  Administered 2020-03-27: 5 mL

## 2020-03-27 MED ORDER — AMISULPRIDE (ANTIEMETIC) 5 MG/2ML IV SOLN: 10.0000 mg | Freq: Once | INTRAVENOUS | Status: DC | PRN

## 2020-03-27 MED ORDER — ACETAMINOPHEN 160 MG/5ML PO SOLN
325.0000 mg | Freq: Once | ORAL | Status: DC | PRN
Start: 2020-03-27 — End: 2020-03-27

## 2020-03-27 MED ORDER — OXYCODONE HCL 5 MG/5ML PO SOLN: 5.0000 mg | Freq: Once | ORAL | Status: DC | PRN

## 2020-03-27 MED ORDER — PROPOFOL 10 MG/ML IV BOLUS
INTRAVENOUS | Status: DC | PRN
  Administered 2020-03-27: 10 mg via INTRAVENOUS

## 2020-03-27 MED ORDER — FENTANYL CITRATE (PF) 100 MCG/2ML IJ SOLN
INTRAMUSCULAR | Status: AC
  Filled 2020-03-27: qty 2

## 2020-03-27 MED ORDER — EPHEDRINE SULFATE-NACL 50-0.9 MG/10ML-% IV SOSY
PREFILLED_SYRINGE | INTRAVENOUS | Status: DC | PRN
  Administered 2020-03-27 (×5): 10 mg via INTRAVENOUS

## 2020-03-27 MED ORDER — FENTANYL CITRATE (PF) 100 MCG/2ML IJ SOLN: 25.0000 ug | INTRAMUSCULAR | Status: DC | PRN

## 2020-03-27 MED ORDER — PROPOFOL 10 MG/ML IV BOLUS
INTRAVENOUS | Status: AC
  Filled 2020-03-27: qty 40

## 2020-03-27 MED ORDER — LIDOCAINE 2% (20 MG/ML) 5 ML SYRINGE
INTRAMUSCULAR | Status: DC | PRN
  Administered 2020-03-27: 60 mg via INTRAVENOUS

## 2020-03-27 MED ORDER — OXYBUTYNIN CHLORIDE 5 MG PO TABS
5.0000 mg | ORAL_TABLET | Freq: Three times a day (TID) | ORAL | Status: DC
  Administered 2020-03-27: 12:00:00 5 mg via ORAL

## 2020-03-27 MED ORDER — MIDAZOLAM HCL 2 MG/2ML IJ SOLN
INTRAMUSCULAR | Status: DC | PRN
  Administered 2020-03-27 (×2): 1 mg via INTRAVENOUS

## 2020-03-27 MED ORDER — AMISULPRIDE (ANTIEMETIC) 5 MG/2ML IV SOLN
10.0000 mg | Freq: Once | INTRAVENOUS | Status: AC
  Administered 2020-03-27 (×2): 10 mg via INTRAVENOUS

## 2020-03-27 MED ORDER — FENTANYL CITRATE (PF) 100 MCG/2ML IJ SOLN
INTRAMUSCULAR | Status: DC | PRN
  Administered 2020-03-27: 50 ug via INTRAVENOUS
  Administered 2020-03-27 (×2): 25 ug via INTRAVENOUS

## 2020-03-27 MED ORDER — CEFAZOLIN SODIUM-DEXTROSE 2-4 GM/100ML-% IV SOLN
2.0000 g | INTRAVENOUS | Status: AC
  Administered 2020-03-27: 09:00:00 2 g via INTRAVENOUS

## 2020-03-27 MED ORDER — ACETAMINOPHEN 325 MG PO TABS: 325.0000 mg | ORAL_TABLET | Freq: Once | ORAL | Status: DC | PRN

## 2020-03-27 MED ORDER — CEFAZOLIN SODIUM-DEXTROSE 2-4 GM/100ML-% IV SOLN
INTRAVENOUS | Status: AC
  Filled 2020-03-27: qty 100

## 2020-03-27 MED ORDER — OXYBUTYNIN CHLORIDE 5 MG PO TABS: 5.0000 mg | ORAL_TABLET | Freq: Three times a day (TID) | ORAL | 0 refills | Status: DC | PRN

## 2020-03-27 MED ORDER — MEPERIDINE HCL 25 MG/ML IJ SOLN: 6.2500 mg | INTRAMUSCULAR | Status: DC | PRN

## 2020-03-27 MED ORDER — MIDAZOLAM HCL 2 MG/2ML IJ SOLN
INTRAMUSCULAR | Status: AC
  Filled 2020-03-27: qty 2

## 2020-03-27 MED ORDER — AMISULPRIDE (ANTIEMETIC) 5 MG/2ML IV SOLN
INTRAVENOUS | Status: AC
  Filled 2020-03-27: qty 4

## 2020-03-27 MED ORDER — LACTATED RINGERS IV SOLN
INTRAVENOUS | Status: DC
  Administered 2020-03-27: 08:00:00 1000 mL via INTRAVENOUS

## 2020-03-27 MED ORDER — WHITE PETROLATUM EX OINT
TOPICAL_OINTMENT | CUTANEOUS | Status: AC
  Filled 2020-03-27: qty 5

## 2020-03-27 MED ORDER — LIDOCAINE HCL (PF) 2 % IJ SOLN
INTRAMUSCULAR | Status: AC
  Filled 2020-03-27: qty 5

## 2020-03-27 MED ORDER — STERILE WATER FOR IRRIGATION IR SOLN
Status: DC | PRN
  Administered 2020-03-27 (×2): 250 mL via INTRAVESICAL

## 2020-03-27 MED ORDER — EPHEDRINE 5 MG/ML INJ
INTRAVENOUS | Status: AC
  Filled 2020-03-27: qty 10

## 2020-03-27 MED ORDER — LACTATED RINGERS IV SOLN: INTRAVENOUS | Status: DC

## 2020-03-27 MED ORDER — OXYCODONE HCL 5 MG PO TABS: 5.0000 mg | ORAL_TABLET | Freq: Once | ORAL | Status: DC | PRN

## 2020-03-27 SURGICAL SUPPLY — 47 items
ADH SKN CLS APL DERMABOND .7 (GAUZE/BANDAGES/DRESSINGS) ×1
BAG DRAIN URO-CYSTO SKYTR STRL (DRAIN) IMPLANT
BAG DRN UROCATH (DRAIN)
BAG URINE LEG 500ML (DRAIN) IMPLANT
BLADE CLIPPER SENSICLIP SURGIC (BLADE) IMPLANT
BLADE SURG 15 STRL LF DISP TIS (BLADE) ×1 IMPLANT
BLADE SURG 15 STRL SS (BLADE) ×3
CANISTER SUCT 1200ML W/VALVE (MISCELLANEOUS) IMPLANT
CATH SILICONE 14FRX5CC (CATHETERS) ×3 IMPLANT
COVER LIGHT HANDLE STERIS (MISCELLANEOUS) ×3 IMPLANT
COVER MAYO STAND STRL (DRAPES) ×3 IMPLANT
COVER WAND RF STERILE (DRAPES) ×3 IMPLANT
DECANTER SPIKE VIAL GLASS SM (MISCELLANEOUS) ×3 IMPLANT
DERMABOND ADVANCED (GAUZE/BANDAGES/DRESSINGS) ×2
DERMABOND ADVANCED .7 DNX12 (GAUZE/BANDAGES/DRESSINGS) ×1 IMPLANT
ELECT REM PT RETURN 9FT ADLT (ELECTROSURGICAL) ×3
ELECTRODE REM PT RTRN 9FT ADLT (ELECTROSURGICAL) ×1 IMPLANT
GLOVE BIO SURGEON STRL SZ8.5 (GLOVE) IMPLANT
GLOVE BIOGEL PI IND STRL 7.0 (GLOVE) ×1 IMPLANT
GLOVE BIOGEL PI INDICATOR 7.0 (GLOVE) ×2
GLOVE ECLIPSE 6.5 STRL STRAW (GLOVE) ×3 IMPLANT
GLOVE SURG SS PI 6.5 STRL IVOR (GLOVE) ×3 IMPLANT
GOWN STRL REUS W/TWL LRG LVL3 (GOWN DISPOSABLE) ×3 IMPLANT
GOWN STRL REUS W/TWL XL LVL3 (GOWN DISPOSABLE) IMPLANT
IV NS 1000ML (IV SOLUTION) ×3
IV NS 1000ML BAXH (IV SOLUTION) ×1 IMPLANT
KIT TURNOVER CYSTO (KITS) ×3 IMPLANT
MANIFOLD NEPTUNE II (INSTRUMENTS) ×3 IMPLANT
NEEDLE HYPO 25X1 1.5 SAFETY (NEEDLE) ×3 IMPLANT
NS IRRIG 500ML POUR BTL (IV SOLUTION) ×3 IMPLANT
PACK BASIN DAY SURGERY FS (CUSTOM PROCEDURE TRAY) ×3 IMPLANT
PACK CYSTO (CUSTOM PROCEDURE TRAY) ×3 IMPLANT
PACKING VAGINAL (PACKING) ×3 IMPLANT
PENCIL SMOKE EVACUATOR (MISCELLANEOUS) IMPLANT
PLUG CATH AND CAP STER (CATHETERS) ×3 IMPLANT
RETRACTOR STAY HOOK 5MM (MISCELLANEOUS) ×3 IMPLANT
SLING LYNX SUPRAPUBIC (Sling) ×3 IMPLANT
SUCTION FRAZIER HANDLE 10FR (MISCELLANEOUS) ×2
SUCTION TUBE FRAZIER 10FR DISP (MISCELLANEOUS) ×1 IMPLANT
SUT VIC AB 2-0 UR5 27 (SUTURE) ×3 IMPLANT
SYR BULB IRRIG 60ML STRL (SYRINGE) ×3 IMPLANT
TRAY FOL W/BAG SLVR 16FR STRL (SET/KITS/TRAYS/PACK) ×1 IMPLANT
TRAY FOLEY W/BAG SLVR 16FR LF (SET/KITS/TRAYS/PACK) ×3
TUBE CONNECTING 12'X1/4 (SUCTIONS) ×2
TUBE CONNECTING 12X1/4 (SUCTIONS) ×4 IMPLANT
WATER STERILE IRR 500ML POUR (IV SOLUTION) IMPLANT
YANKAUER SUCT BULB TIP NO VENT (SUCTIONS) ×3 IMPLANT

## 2020-03-27 NOTE — Anesthesia Preprocedure Evaluation (Addendum)
Anesthesia Evaluation  Patient identified by MRN, date of birth, ID band Patient awake    Reviewed: Allergy & Precautions, NPO status , Patient's Chart, lab work & pertinent test results  Airway Mallampati: I  TM Distance: >3 FB Neck ROM: Full    Dental  (+) Dental Advisory Given, Chipped,    Pulmonary sleep apnea and Continuous Positive Airway Pressure Ventilation ,    breath sounds clear to auscultation       Cardiovascular hypertension, +CHF   Rhythm:Regular Rate:Normal     Neuro/Psych PSYCHIATRIC DISORDERS Depression  Neuromuscular disease    GI/Hepatic Neg liver ROS, GERD  ,  Endo/Other  diabetes, Type 2Hypothyroidism   Renal/GU      Musculoskeletal  (+) Arthritis ,   Abdominal Normal abdominal exam  (+)   Peds  Hematology negative hematology ROS (+)   Anesthesia Other Findings   Reproductive/Obstetrics                           Anesthesia Physical Anesthesia Plan  ASA: II  Anesthesia Plan: General   Post-op Pain Management:    Induction: Intravenous  PONV Risk Score and Plan: 4 or greater and Ondansetron, Dexamethasone, Midazolam and Scopolamine patch - Pre-op  Airway Management Planned: LMA and Oral ETT  Additional Equipment: None  Intra-op Plan:   Post-operative Plan: Extubation in OR  Informed Consent: I have reviewed the patients History and Physical, chart, labs and discussed the procedure including the risks, benefits and alternatives for the proposed anesthesia with the patient or authorized representative who has indicated his/her understanding and acceptance.     Dental advisory given  Plan Discussed with: CRNA  Anesthesia Plan Comments:        Anesthesia Quick Evaluation

## 2020-03-27 NOTE — Transfer of Care (Signed)
Immediate Anesthesia Transfer of Care Note  Patient: Virginia Marks  Procedure(s) Performed: Procedure(s) (LRB): LYNX MID URETHRAL SLING (N/A) CYSTOSCOPY (N/A)  Patient Location: PACU  Anesthesia Type: General  Level of Consciousness: awake, oriented, sedated and patient cooperative  Airway & Oxygen Therapy: Patient Spontanous Breathing and Patient connected to face mask oxygen  Post-op Assessment: Report given to PACU RN and Post -op Vital signs reviewed and stable  Post vital signs: Reviewed and stable  Complications: No apparent anesthesia complications Last Vitals:  Vitals Value Taken Time  BP 110/51 03/27/20 1007  Temp 36.5 C 03/27/20 1007  Pulse 73 03/27/20 1009  Resp 17 03/27/20 1009  SpO2 96 % 03/27/20 1009  Vitals shown include unvalidated device data.  Last Pain:  Vitals:   03/27/20 0746  TempSrc: Oral  PainSc: 0-No pain      Patients Stated Pain Goal: 6 (03/27/20 0746)  Complications: No complications documented.

## 2020-03-27 NOTE — Interval H&P Note (Signed)
History and Physical Interval Note:  03/27/2020 8:03 AM  Virginia Marks  has presented today for surgery, with the diagnosis of STRESS URINARY INCONTINENCE.  The various methods of treatment have been discussed with the patient and family. After consideration of risks, benefits and other options for treatment, the patient has consented to  Procedure(s) with comments: LYNX MID URETHRAL SLING (N/A) - 75 MINS CYSTOSCOPY (N/A) as a surgical intervention.  The patient's history has been reviewed, patient examined, no change in status, stable for surgery.  I have reviewed the patient's chart and labs.  Questions were answered to the patient's satisfaction.     [name]

## 2020-03-27 NOTE — Op Note (Signed)
Operative Note  Preoperative diagnosis:  1.  Stress urinary incontinence  Postoperative diagnosis: 1.  Stress urinary incontinence  Procedure(s): 1.  [employer] [place] mid urethral sling and cystoscopy  Surgeon: Kasandra Knudsen, MD  Assistants:  None  Anesthesia:  General  Complications:  None  EBL: 25 mL  Specimens: 1.  None  Drains/Catheters: 1.  14 French Foley catheter  Intraoperative findings:   1. Moderate vaginal atrophy 2. Grade 2 apical pelvic organ prolapse 3. Normal bladder mucosa on cystoscopy without findings of trochars or mesh 4. Bilateral orthotopic ureteral orifices  Indication:  Virginia Marks is a 64 y.o. female with symptomatic stress urinary incontinence presents for mid urethral sling placement.  Description of procedure: A 16 French Foley catheter was then placed in the patient's urethra and the bladder was drained. The Foley catheter was then capped.  Using the skin hooks for skin nodes were placed in the corners of the labia minora to open up the vaginal vestibule. 1% lidocarine with 1% epinephrine was then injected into the periurethral tissue. The suprapubic incisions were then marked out 1 cm lateral to midline and 1 cm above the pubic bone. Using a 15 blade a stab incision was made in both sides of midline. Gauze is placed over these areas to control the skin bleeding. A 1.5 cm incision was then made in the mid urethra through the vaginal mucosa. Using this Lee scissors dissection was then carried out. Her urethra we laterally on both sides to the endopelvic fascia. The dissection was completed once there was enough space to place a finger through the incision on both sides of the urethra. Using the [facility] needle set both needles were passed through the stab incisions suprapubically down posterior to the pubic bone and then through the periurethral incisions using the index finger to guide the needle out of the incision. Once both  needles had been placed the Foley catheter was removed and cystoscopy was performed.  A 360 cystoscopic evaluation was performed. There was no mucosal abnormalities or evidence of perforation from the needles. The cystoscope was then removed and the Foley catheter replaced. The bladder was then drained again and the Foley catheter. The ends of the sling were then attached to the needles and the needles pulled up through the retropubic space and out the suprapubic incision. Once the sling was noted to be centered the blue The apex of the sling was cut and the plastic sheath was then pulled out. The mesh was noted to be well seated around the urethra. A right angle was used to ensure that the proper amount of tension for the sling around the urethra applied. The sling was noted to be tension free and the periurethral area and well positioned. At this point copious amounts of double antibiotic irrigation was then used to irrigate the incision the periurethral space and the vagina. The incision was then closed with 2-0 Vicryl in a running fashion. The stab incisions in the suprapubic area were closed with Dermabond. The vagina was then packed with estrace impregnated vaginal packing. The patient was subsequently extubated and returned to the PACU in stable condition.  Disposition: Vaginal packing will be removed in the PACU. The patient will be sent home with instructions to remove the Foley catheter in 24 hours. She will be given 3 days of antibiotics as well as pain medications. Followup has been scheduled for 2 weeks.   Plan:  Discharge home with foley catheter

## 2020-03-27 NOTE — Discharge Instructions (Signed)
Post-op Instructions following Mid-urethral Sling surgery   Removal of catheter You will go home with a catheter or tube in your bladder. You will remove this the day after surgery by cutting the tube that sticks off the end of the catheter (the balloon port with numbers written on it). You can do this in the shower and allow the catheter to fall out.  Ask [place] if you have any questions about the catheter management.  Remove the foley catheter after 24 hours ( day after the procedure).can be done easily by cutting the side port of the catheter, whichallow the balloon to deflate.  You will see 1-2 teaspoons of clear water as the balloon deflates and then the catheter can be slid out without difficulty.        Cut here   Diet:  You may return to your normal diet within 24 hours following your surgery. You may note some mild nausea and possibly vomiting the first 6-8 hours following surgery. This is usually due to the side effects of anesthesia, and will disappear quite soon. I would suggest clear liquids and a very light meal the first evening following your surgery.  Activity:  Your physical activity is to be restricted, especially during the first 2 weeks home. During this time use the following guidelines:   No lifting heavy objects (anything greater than 10 pounds).  No driving a car and limit long car rides.  No strenuous exercise, limits stairclimbing to a minimum.  Do not swim or soak in a bath tub for 2 weeks.  Do not place anything per vagina for 4 weeks.  Wound care: Marland Kitchen There is very little wound care.  The suprapubic incisions (above your pubic bone) are glued closed and this will peel off over the next 7-14 days.  They do not need to be covered.   . The vaginal incision may ooze/bleed a little bit the first couple days after surgery.  This is normal.  It is okay to use a sanitary pad to help keep things clean.  Otherwise this incision needs no additional  care.  Bowels:  You may need a stool softener and. A bowel movement every other day is reasonable. Use a mild laxative if needed, such as milk of magnesia 2-3 tablespoons, or 2 Dulcolax tablets. Call if you continue to have problems. If you had been taking narcotics for pain, before, during or after your surgery, you may be constipated. Take a laxative if necessary.  Medication:  You should resume your pre-surgery medications unless told not to. In addition you may be given an antibiotic to prevent or treat infection. Antibiotics are not always necessary. Pain pills (Tramadol) may also be given to help with the incision and catheter discomfort. Tylenol (acetaminophen) or Advil (ibuprofen) which have no narcotics are better if the pain is not too bad. All medication should be taken as prescribed until the bottles are finished unless you are having an unusual reaction to one of the drugs.  Problems you should report to [place]:  a. Fever greater than 101F. b. Heavy bleeding, or clots (see notes above about blood in urine). c. Inability to urinate. d. Drug reactions (hives, rash, nausea, vomiting, diarrhea). e. Severe burning or pain with urination that is not improving.   Post Anesthesia Home Care Instructions  Activity: Get plenty of rest for the remainder of the day. A responsible adult should stay with you for 24 hours following the procedure.  For the next 24 hours,  DO NOT: -Drive a car -Advertising copywriter -Drink alcoholic beverages -Take any medication unless instructed by your physician -Make any legal decisions or sign important papers.  Meals: Start with liquid foods such as gelatin or soup. Progress to regular foods as tolerated. Avoid greasy, spicy, heavy foods. If nausea and/or vomiting occur, drink only clear liquids until the nausea and/or vomiting subsides. Call your physician if vomiting continues.  Special Instructions/Symptoms: Your throat may feel dry or sore from the  anesthesia or the breathing tube placed in your throat during surgery. If this causes discomfort, gargle with warm salt water. The discomfort should disappear within 24 hours.  If you had a scopolamine patch placed behind your ear for the management of post- operative nausea and/or vomiting:  1. The medication in the patch is effective for 72 hours, after which it should be removed.  Wrap patch in a tissue and discard in the trash. Wash hands thoroughly with soap and water. 2. You may remove the patch earlier than 72 hours if you experience unpleasant side effects which may include dry mouth, dizziness or visual disturbances. 3. Avoid touching the patch. Wash your hands with soap and water after contact with the patch.

## 2020-03-27 NOTE — Anesthesia Postprocedure Evaluation (Signed)
Anesthesia Post Note  Patient: Virginia Marks  Procedure(s) Performed: [name] MID URETHRAL SLING (N/A Vagina ) CYSTOSCOPY (N/A Vagina )     Patient location during evaluation: PACU Anesthesia Type: General Level of consciousness: awake and alert Pain management: pain level controlled Vital Signs Assessment: post-procedure vital signs reviewed and stable Respiratory status: spontaneous breathing, nonlabored ventilation, respiratory function stable and patient connected to nasal cannula oxygen Cardiovascular status: blood pressure returned to baseline and stable Postop Assessment: no apparent nausea or vomiting Anesthetic complications: no   No complications documented.  Last Vitals:  Vitals:   03/27/20 1110 03/27/20 1153  BP: (!) 108/57 (!) 101/42  Pulse: 70 66  Resp: 12 20  Temp:  36.8 C  SpO2: 97% 100%    Last Pain:  Vitals:   03/27/20 1153  TempSrc:   PainSc: 3                  Shelton Silvas

## 2020-03-27 NOTE — Anesthesia Procedure Notes (Signed)
Procedure Name: LMA Insertion Date/Time: 03/27/2020 8:40 AM Performed by: Francie Massing, CRNA Pre-anesthesia Checklist: Patient identified, Emergency Drugs available, Suction available and Patient being monitored Patient Re-evaluated:Patient Re-evaluated prior to induction Oxygen Delivery Method: Circle system utilized Preoxygenation: Pre-oxygenation with 100% oxygen Induction Type: IV induction Ventilation: Mask ventilation without difficulty LMA: LMA inserted LMA Size: 4.0 Number of attempts: 1 Airway Equipment and Method: Bite block Placement Confirmation: positive ETCO2 Tube secured with: Tape Dental Injury: Teeth and Oropharynx as per pre-operative assessment

## 2020-03-28 ENCOUNTER — Encounter (HOSPITAL_BASED_OUTPATIENT_CLINIC_OR_DEPARTMENT_OTHER): Payer: Self-pay | Admitting: Urology

## 2020-04-26 ENCOUNTER — Ambulatory Visit: Payer: BC Managed Care – PPO | Admitting: Urology

## 2020-04-27 ENCOUNTER — Ambulatory Visit: Payer: BC Managed Care – PPO | Admitting: Urology

## 2020-05-22 ENCOUNTER — Encounter: Payer: Self-pay | Admitting: Internal Medicine

## 2020-09-16 DIAGNOSIS — K219 Gastro-esophageal reflux disease without esophagitis: Secondary | ICD-10-CM | POA: Insufficient documentation

## 2020-09-16 DIAGNOSIS — E1165 Type 2 diabetes mellitus with hyperglycemia: Secondary | ICD-10-CM | POA: Insufficient documentation

## 2020-09-16 DIAGNOSIS — E039 Hypothyroidism, unspecified: Secondary | ICD-10-CM | POA: Insufficient documentation

## 2020-09-16 DIAGNOSIS — I5032 Chronic diastolic (congestive) heart failure: Secondary | ICD-10-CM | POA: Insufficient documentation

## 2020-09-20 ENCOUNTER — Other Ambulatory Visit (HOSPITAL_COMMUNITY): Payer: Self-pay | Admitting: Internal Medicine

## 2020-09-20 ENCOUNTER — Other Ambulatory Visit: Payer: Self-pay | Admitting: Internal Medicine

## 2020-09-20 DIAGNOSIS — M5440 Lumbago with sciatica, unspecified side: Secondary | ICD-10-CM

## 2020-09-28 ENCOUNTER — Ambulatory Visit (HOSPITAL_COMMUNITY): Payer: BC Managed Care – PPO

## 2020-10-09 ENCOUNTER — Ambulatory Visit (HOSPITAL_COMMUNITY)
Admission: RE | Admit: 2020-10-09 | Discharge: 2020-10-09 | Disposition: A | Discharge status: Home / Self Care | Payer: BC Managed Care – PPO | Source: Ambulatory Visit | Attending: Internal Medicine | Admitting: Internal Medicine

## 2020-10-09 ENCOUNTER — Other Ambulatory Visit: Payer: Self-pay

## 2020-10-09 DIAGNOSIS — M5440 Lumbago with sciatica, unspecified side: Secondary | ICD-10-CM

## 2020-10-09 MED ORDER — GADOBUTROL 1 MMOL/ML IV SOLN
7.0000 mL | Freq: Once | INTRAVENOUS | Status: AC | PRN
  Administered 2020-10-09: 15:00:00 7 mL via INTRAVENOUS

## 2020-11-05 ENCOUNTER — Ambulatory Visit: Payer: BC Managed Care – PPO | Admitting: Cardiology

## 2020-12-28 DIAGNOSIS — L4050 Arthropathic psoriasis, unspecified: Secondary | ICD-10-CM | POA: Insufficient documentation

## 2020-12-28 DIAGNOSIS — M2550 Pain in unspecified joint: Secondary | ICD-10-CM | POA: Insufficient documentation

## 2020-12-28 DIAGNOSIS — M545 Other chronic pain: Secondary | ICD-10-CM | POA: Insufficient documentation

## 2020-12-28 DIAGNOSIS — I517 Cardiomegaly: Secondary | ICD-10-CM | POA: Insufficient documentation

## 2020-12-28 DIAGNOSIS — Z8781 Personal history of (healed) traumatic fracture: Secondary | ICD-10-CM | POA: Insufficient documentation

## 2020-12-28 DIAGNOSIS — E669 Obesity, unspecified: Secondary | ICD-10-CM | POA: Insufficient documentation

## 2020-12-28 DIAGNOSIS — F331 Major depressive disorder, recurrent, moderate: Secondary | ICD-10-CM | POA: Insufficient documentation

## 2021-01-21 ENCOUNTER — Other Ambulatory Visit (HOSPITAL_COMMUNITY): Payer: Self-pay | Admitting: Family Medicine

## 2021-01-21 ENCOUNTER — Ambulatory Visit (HOSPITAL_COMMUNITY)
Admission: RE | Admit: 2021-01-21 | Discharge: 2021-01-21 | Disposition: A | Discharge status: Home / Self Care | Payer: BC Managed Care – PPO | Source: Ambulatory Visit | Attending: Family Medicine | Admitting: Family Medicine

## 2021-01-21 DIAGNOSIS — R112 Nausea with vomiting, unspecified: Secondary | ICD-10-CM | POA: Diagnosis present

## 2021-02-12 ENCOUNTER — Ambulatory Visit: Payer: BC Managed Care – PPO | Admitting: Cardiology

## 2021-02-13 ENCOUNTER — Ambulatory Visit (INDEPENDENT_AMBULATORY_CARE_PROVIDER_SITE_OTHER): Payer: BC Managed Care – PPO | Admitting: Cardiology

## 2021-02-13 ENCOUNTER — Encounter: Payer: Self-pay | Admitting: Cardiology

## 2021-02-13 ENCOUNTER — Other Ambulatory Visit: Payer: Self-pay

## 2021-02-13 VITALS — BP 132/76 | HR 80 | Ht 64.0 in | Wt 209.0 lb

## 2021-02-13 DIAGNOSIS — E782 Mixed hyperlipidemia: Secondary | ICD-10-CM

## 2021-02-13 DIAGNOSIS — I5032 Chronic diastolic (congestive) heart failure: Secondary | ICD-10-CM | POA: Diagnosis not present

## 2021-02-13 DIAGNOSIS — R002 Palpitations: Secondary | ICD-10-CM | POA: Diagnosis not present

## 2021-02-13 DIAGNOSIS — G4730 Sleep apnea, unspecified: Secondary | ICD-10-CM | POA: Diagnosis not present

## 2021-02-13 MED ORDER — NITROGLYCERIN 0.4 MG SL SUBL: 0.4000 mg | SUBLINGUAL_TABLET | SUBLINGUAL | 3 refills | Status: DC | PRN

## 2021-02-13 NOTE — Patient Instructions (Signed)
Medication Instructions:  Your physician recommends that you continue on your current medications as directed. Please refer to the Current Medication list given to you today.  *If you need a refill on your cardiac medications before your next appointment, please call your pharmacy*   Lab Work: None If you have labs (blood work) drawn today and your tests are completely normal, you will receive your results only by: MyChart Message (if you have MyChart) OR A paper copy in the mail If you have any lab test that is abnormal or we need to change your treatment, we will call you to review the results.   Testing/Procedures: None   Follow-Up: At [facility], you and your health needs are our priority.  As part of our continuing mission to provide you with exceptional heart care, we have created designated Provider Care Teams.  These Care Teams include your primary Cardiologist (physician) and Advanced Practice Providers (APPs -  Physician Assistants and Nurse Practitioners) who all work together to provide you with the care you need, when you need it.  We recommend signing up for the patient portal called "MyChart".  Sign up information is provided on this After Visit Summary.  MyChart is used to connect with patients for Virtual Visits (Telemedicine).  Patients are able to view lab/test results, encounter notes, upcoming appointments, etc.  Non-urgent messages can be sent to your provider as well.   To learn more about what you can do with MyChart, go to [email or web address].    Your next appointment:   1 year(s)  The format for your next appointment:   In Person  Provider:   Dina Rich, MD    Other Instructions You have been referred to [facility]. They will contact you with your first appointment.

## 2021-02-13 NOTE — Progress Notes (Signed)
Clinical Summary Virginia Marks is a 65 y.o.femaleseen today for follow up of the following medical problems.    1. LE edema/Chronic diastolic HF - 2017 echo with LVEF 60-65%, grade II diastolic dysfunction   12/2018 echo LVEF 55-60%, indet diastolic function, normal RV     -no recent troubles.  - has lost 50 lbs over the last 2 years with aggressive dietary changes - taking lasix 40mg  daily - does water aerobics at the [facility]      2. Chest pain - started about 2 months,4 episodes - last episode while driving on Monday. Woke up that morning mild pain under left breast.  - sharp pain midchest to left chest pain, 10/10 in severity. She reports chronic SOB, unsure if worst during episode. Feeling of heart racing.  - pain lasted 3 minutes, resolved on its own. - other episodes often at night   - has been walking her dog x 0.5 miles a few times a day, sometimes can have some SOB with it  CAD risk factors: HTN, HL   - some recent chest pains - 09/2017 nuclear stress no ischemia - sharp pain mid/left chest, 6-7/10 in severity. Usually occurs at rest or with exrtion. No other associated symptoms. Not positional. Lasts for a few minutes, resolves after NG.  - same prior chronic chest pain. Uses NG about once a month.        3. Palpitations - heart monitor 10/2015 with SR, sinus tach and occasional PVCs - no recent significant symptoms.   - no recent palpitations.      4. OSA  - uses CPAP machine, not followed by a provider    5. Psoriatic arthritis - on methotrexate,   6. 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  Use to keep multiple foreign exchange students at her home each year but recently has been taking a breadk Has 3 dogs    Past Medical History:  Diagnosis Date   Bilateral lower extremity edema    Chronic  diastolic CHF (congestive heart failure) (HCC) 2017   cardiologist--- dr j. Shawndell Schillaci-   Depression    GERD (gastroesophageal reflux disease)    Hyperlipidemia    Hypertension    Hypothyroidism    followed by pcp   Lumbar post-laminectomy syndrome    OAB (overactive bladder)    OSA on CPAP    Psoriasis    SCALP   Psoriatic arthritis [facility])    rheumatologist--- dr [facility]   Spinal stenosis    SUI (stress urinary incontinence, female)    Type 2 diabetes mellitus (HCC)    followed by pcp   Wears hearing aid in both ears      Allergies  Allergen Reactions   Latex Rash   Sulfa Antibiotics Rash     Current Outpatient Medications  Medication Sig Dispense Refill   Biotin 10 MG CAPS Take 1 capsule by mouth daily.     buPROPion (WELLBUTRIN XL) 150 MG 24 hr tablet Take 150 mg by mouth daily.      CALCIUM-VITAMIN D PO Take 1 tablet by mouth daily. Calcium w/ Vitamin D3 5,000 units     clobetasol (TEMOVATE) 0.05 % external solution Apply 1 application topically as needed.  (Patient not taking: No sig reported)     dapagliflozin  propanediol (FARXIGA) 10 MG TABS tablet Take 10 mg by mouth daily.     diclofenac (VOLTAREN) 75 MG EC tablet Take 1 tablet (75 mg total) by mouth 2 (two) times daily. For muscle and  Joint pain 60 tablet 2   docusate sodium (COLACE) 100 MG capsule Take 100 mg by mouth at bedtime as needed for mild constipation.     DULoxetine (CYMBALTA) 60 MG capsule Take 60 mg by mouth at bedtime.     estradiol (ESTRACE) 0.1 MG/GM vaginal cream Place 1 Applicatorful vaginally as directed.     folic acid (FOLVITE) 1 MG tablet Take 1 mg by mouth daily.     furosemide (LASIX) 40 MG tablet Take 40 mg daily, may take additional 20 mg for swelling 100 tablet 3   gabapentin (NEURONTIN) 300 MG capsule Take 300 mg by mouth 3 (three) times daily.      Glucosamine-Chondroitin 1500-1200 MG/30ML LIQD Take 1 capsule by mouth daily.     levothyroxine (SYNTHROID) 75 MCG tablet Take 75 mcg by  mouth daily before breakfast.      methotrexate (RHEUMATREX) 2.5 MG tablet Take 15 mg by mouth once a week.     metoprolol tartrate (LOPRESSOR) 25 MG tablet TAKE 1/2 TABLET BY MOUTH 2 TIMES A DAY. (Patient taking differently: Take 12.5 mg by mouth 2 (two) times daily. TAKE 1/2 TABLET BY MOUTH 2 TIMES A DAY.) 90 tablet 1   nitroGLYCERIN (NITROSTAT) 0.4 MG SL tablet Place 1 tablet (0.4 mg total) under the tongue every 5 (five) minutes as needed for chest pain. 25 tablet 3   olmesartan (BENICAR) 20 MG tablet Take 20 mg by mouth at bedtime.     Omega-3 Fatty Acids (FISH OIL PO) Take 1 capsule by mouth daily.     omeprazole (PRILOSEC) 40 MG capsule Take 40 mg by mouth daily.      ondansetron (ZOFRAN) 8 MG tablet Take 8 mg by mouth daily as needed for nausea or vomiting.     oxybutynin (DITROPAN) 5 MG tablet Take 1 tablet (5 mg total) by mouth every 8 (eight) hours as needed for bladder spasms. 3 tablet 0   potassium chloride SA (KLOR-CON) 20 MEQ tablet TAKE ONE TABLET BY MOUTH DAILY WHILE TAKING FUROSEMIDE. (Patient taking differently: Take 20 mEq by mouth daily.) 90 tablet 2   pravastatin (PRAVACHOL) 20 MG tablet Take 20 mg by mouth at bedtime.     traMADol (ULTRAM) 50 MG tablet Take 1 tablet (50 mg total) by mouth every 6 (six) hours as needed. 20 tablet 0   Vibegron (GEMTESA) 75 MG TABS Take 75 mg by mouth daily. 30 tablet 11   No current facility-administered medications for this visit.     Past Surgical History:  Procedure Laterality Date   ANTERIOR CERVICAL DECOMP/DISCECTOMY FUSION  09/2015   C7 -- T1   BREAST EXCISIONAL BIOPSY Right x2  2001 and 2002   both negative   CYSTOSCOPY N/A 03/27/2020   Procedure: CYSTOSCOPY;  Surgeon: Noel Christmas, MD;  Location: [facility];  Service: Urology;  Laterality: N/A;   LAPAROSCOPIC CHOLECYSTECTOMY  2001   LUMBAR FUSION  10/2014   L3 --- S1   PUBOVAGINAL SLING N/A 03/27/2020   Procedure: [name] MID URETHRAL SLING;  Surgeon: Noel Christmas, MD;  Location: [facility];  Service: Urology;  Laterality: N/A;  75 MINS   ROTATOR CUFF REPAIR Right 2019   TONSILLECTOMY  child   TOTAL ABDOMINAL HYSTERECTOMY W/  BILATERAL SALPINGOOPHORECTOMY  2003     Allergies  Allergen Reactions   Latex Rash   Sulfa Antibiotics Rash      Family History  Problem Relation Age of Onset   Breast cancer Sister 40   Breast cancer Maternal Aunt 31   Breast cancer Maternal Aunt 70   Breast cancer Maternal Aunt 80   Breast cancer Maternal Aunt 71   Hypertension Mother    Pulmonary fibrosis Father      Social History Virginia Marks reports that she has never smoked. She has never used smokeless tobacco. Virginia Marks reports that she does not currently use alcohol.   Review of Systems CONSTITUTIONAL: No weight loss, fever, chills, weakness or fatigue.  HEENT: Eyes: No visual loss, blurred vision, double vision or yellow sclerae.No hearing loss, sneezing, congestion, runny nose or sore throat.  SKIN: No rash or itching.  CARDIOVASCULAR: per hpi RESPIRATORY: No shortness of breath, cough or sputum.  GASTROINTESTINAL: No anorexia, nausea, vomiting or diarrhea. No abdominal pain or blood.  GENITOURINARY: No burning on urination, no polyuria NEUROLOGICAL: No headache, dizziness, syncope, paralysis, ataxia, numbness or tingling in the extremities. No change in bowel or bladder control.  MUSCULOSKELETAL: No muscle, back pain, joint pain or stiffness.  LYMPHATICS: No enlarged nodes. No history of splenectomy.  PSYCHIATRIC: No history of depression or anxiety.  ENDOCRINOLOGIC: No reports of sweating, cold or heat intolerance. No polyuria or polydipsia.  Marland Kitchen   Physical Examination Today's Vitals   02/13/21 0826  BP: 132/76  Pulse: 80  SpO2: 97%  Weight: 209 lb (94.8 kg)  Height: 5\' 4"  (1.626 m)   Body mass index is 35.87 kg/m.  Gen: resting comfortably, no acute distress HEENT: no scleral icterus, pupils equal round and  reactive, no palptable cervical adenopathy,  CV: RRR, no m/r/g, no jvd Resp: Clear to auscultation bilaterally GI: abdomen is soft, non-tender, non-distended, normal bowel sounds, no hepatosplenomegaly MSK: extremities are warm, no edema.  Skin: warm, no rash Neuro:  no focal deficits Psych: appropriate affect   Diagnostic Studies 10/2015 echo Study Conclusions   - Left ventricle: The cavity size was normal. Wall thickness was   increased in a pattern of mild LVH. Systolic function was normal.   The estimated ejection fraction was in the range of 60% to 65%.   Wall motion was normal; there were no regional wall motion   abnormalities. Features are consistent with a pseudonormal left   ventricular filling pattern, with concomitant abnormal relaxation   and increased filling pressure (grade 2 diastolic dysfunction). - Aortic valve: Mildly calcified annulus. Trileaflet. There was   very mild stenosis. Peak velocity (S): 200 cm/s. Mean gradient   (S): 8 mm Hg. Valve area (VTI): 1.69 cm^2. Valve area (Vmax):   1.72 cm^2. Valve area (Vmean): 1.79 cm^2. - Mitral valve: Calcified annulus.   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      09/2017 nuclear stress Blood pressure demonstrated a hypertensive response to exercise. There was no ST segment deviation noted during stress. Diffuse nonspecific T wave abnormalities seen throughout study. The study is normal. No myocardial ischemia or scar. Rest images affected by artifact. This is a low risk study. Nuclear stress EF: 66%.     12/2018 echo IMPRESSIONS      1. Left ventricular ejection fraction, by visual estimation, is 55  to 60%. The left ventricle has normal function. There is mildly increased left ventricular hypertrophy.  2. Left ventricular diastolic Doppler  parameters are indeterminate pattern of LV diastolic filling.  3. Global right ventricle has normal systolic function.The right ventricular size is normal. No increase in right ventricular wall thickness.  4. Left atrial size was moderately dilated.  5. Right atrial size was normal.  6. The mitral valve is normal in structure. No evidence of mitral valve regurgitation. No evidence of mitral stenosis.  7. The tricuspid valve is not well visualized. Tricuspid valve regurgitation is trivial.  8. The aortic valve has an indeterminant number of cusps Aortic valve regurgitation was not visualized by color flow Doppler. Structurally normal aortic valve, with no evidence of sclerosis or stenosis.  9. The pulmonic valve was not well visualized. Pulmonic valve regurgitation is not visualized by color flow Doppler. 10. The inferior vena cava is normal in size with <50% respiratory variability, suggesting right atrial pressure of 8 mmHg.    Assessment and Plan   1 Chronic diastolic HF - euvolemic, doing well on lasix 40mg  daily. Recent labs with pcp looked good on diuretic, continue lasix   2. 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 Hyperlipidemia - overall at goal, continue pravastatin.   4. OSA - on cpap, needs to establish with provider for monitoring, refer to [place] [facility]  F/u 1 year  , M.D.

## 2021-02-26 ENCOUNTER — Telehealth: Payer: Self-pay | Admitting: Cardiology

## 2021-02-26 NOTE — Telephone Encounter (Signed)
Pt wanted to make sure her referral was sent to [facility]. Assured pt that referral was entered.

## 2021-02-26 NOTE — Telephone Encounter (Signed)
Patient is following up regarding a sleep apnea referral. She states this was discussed during her appointment on 11/09 with Dr. Wyline Mood. Please advise.

## 2021-07-19 ENCOUNTER — Institutional Professional Consult (permissible substitution): Payer: BC Managed Care – PPO | Admitting: Pulmonary Disease

## 2021-08-08 ENCOUNTER — Encounter: Payer: Self-pay | Admitting: Pulmonary Disease

## 2021-08-08 ENCOUNTER — Ambulatory Visit (INDEPENDENT_AMBULATORY_CARE_PROVIDER_SITE_OTHER): Payer: BC Managed Care – PPO | Admitting: Pulmonary Disease

## 2021-08-08 VITALS — BP 132/88 | HR 74 | Temp 97.7°F | Ht 64.0 in | Wt 194.8 lb

## 2021-08-08 DIAGNOSIS — G4733 Obstructive sleep apnea (adult) (pediatric): Secondary | ICD-10-CM | POA: Diagnosis not present

## 2021-08-08 DIAGNOSIS — Z9989 Dependence on other enabling machines and devices: Secondary | ICD-10-CM

## 2021-08-08 NOTE — Patient Instructions (Signed)
?  X CPAP supplies will be renewed x 1 year [facility] ? ?Call us in feb 2024 & we can repeat sleep study ?

## 2021-08-08 NOTE — Assessment & Plan Note (Signed)
CPAP download was reviewed and 10 cm which shows reasonable compliance average usage about 5 hours per night with an occasional missed night, mild leak and good control of events.  CPAP is certainly helped improve her daytime somnolence and fatigue. ?She reports occasional trouble with nasal pillows especially when she has nasal congestion and we will provide her with a new nasal mask.  CPAP supplies will be renewed, DME is [facility]. ? ? ?Weight loss encouraged, compliance with goal of at least 4-6 hrs every night is the expectation. ?Advised against medications with sedative side effects ?Cautioned against driving when sleepy - understanding that sleepiness will vary on a day to day basis ? ?

## 2021-08-08 NOTE — Progress Notes (Signed)
? ?Subjective:  ? ? Patient ID: Virginia Marks, female    DOB: 10-Apr-1955, 66 y.o.   MRN: [id number] ? ?HPI ? ? 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  She was placed on CPAP 10 cm with nasal pillows with remarkable improvement in her daytime somnolence and fatigue ? ?Since then she has been very compliant.  She has lost 65 pounds using the Noom app. ?Epworth Sleepiness Scale is 8 and she denies any daytime somnolence or fatigue. ?Bedtime is between 10 and 11 PM, sleep latency is minimal, she sleeps on her side with 1 pillow, reports 1-2 nocturnal awakenings, she had neurologic surgery and nocturia has decreased, out of bed around 5 AM when her dogs wake her up.  She then sleeps in the recliner until 6:30 AM when she is finally up, with dryness of mouth denies headaches. ? ?There is no history suggestive of cataplexy, sleep paralysis or parasomnias ? ? ?PMH -HFpEF ?Psoriatic arthritis, on Cosentyx ?Hypertension ?Chronic back pain ? ? ?Significant tests/ events reviewed ? ?09/2017 split-night study AHI 22/hour lowest desat 65%, corrected by CPAP 10 cm nasal pillows ? ?Past Medical History:  ?Diagnosis Date  ? Bilateral lower extremity edema   ? Chronic diastolic CHF (congestive heart failure) (HCC) 2017  ? cardiologist--- dr j. branch-  ? Depression   ? GERD (gastroesophageal reflux disease)   ? Hyperlipidemia   ? Hypertension   ? Hypothyroidism   ? followed by pcp  ? Lumbar post-laminectomy syndrome   ? OAB (overactive bladder)   ? OSA on CPAP   ? Psoriasis   ? SCALP  ? Psoriatic arthritis (HCC)   ? rheumatologist--- dr Dierdre Forth  ? Spinal stenosis   ? SUI (stress urinary incontinence, female)   ? Type 2 diabetes mellitus (HCC)   ? followed by pcp  ? Wears hearing aid in both ears   ? ?Past Surgical History:  ?Procedure Laterality Date  ?  ANTERIOR CERVICAL DECOMP/DISCECTOMY FUSION  09/2015  ? C7 -- T1  ? BREAST EXCISIONAL BIOPSY Right x2  2001 and 2002  ? both negative  ? CYSTOSCOPY N/A 03/27/2020  ? Procedure: CYSTOSCOPY;  Surgeon: Noel Christmas, MD;  Location: [facility];  Service: Urology;  Laterality: N/A;  ? LAPAROSCOPIC CHOLECYSTECTOMY  2001  ? LUMBAR FUSION  10/2014  ? L3 --- S1  ? PUBOVAGINAL SLING N/A 03/27/2020  ? Procedure: LYNX MID URETHRAL SLING;  Surgeon: Noel Christmas, MD;  Location: [facility];  Service: Urology;  Laterality: N/A;  75 MINS  ? ROTATOR CUFF REPAIR Right 2019  ? TONSILLECTOMY  child  ? TOTAL ABDOMINAL HYSTERECTOMY W/ BILATERAL SALPINGOOPHORECTOMY  2003  ? ? ?Allergies  ?Allergen Reactions  ? Latex Rash  ? Sulfa Antibiotics Rash  ? ? ? ?Social History  ? ?Socioeconomic History  ? Marital status: Divorced  ?  Spouse name: Not on file  ? Number of children: Not on file  ? Years of education: Not on file  ? Highest education level: Not on file  ?Occupational History  ? Occupation: Runner, broadcasting/film/video  ?Tobacco Use  ? Smoking status: Never  ? Smokeless tobacco: Never  ?Vaping Use  ? Vaping Use: Never used  ?Substance and Sexual Activity  ? Alcohol use: Yes  ?  Comment: rare  ? Drug use: Never  ?  Sexual activity: Not on file  ?Other Topics Concern  ? Not on file  ?Social History Narrative  ? Not on file  ? ?Social Determinants of Health  ? ?Financial Resource Strain: Not on file  ?Food Insecurity: Not on file  ?Transportation Needs: Not on file  ?Physical Activity: Not on file  ?Stress: Not on file  ?Social Connections: Not on file  ?Intimate Partner Violence: Not on file  ? ? ?Family History  ?Problem Relation Age of Onset  ? Breast cancer Sister 61  ? Breast cancer Maternal Aunt 32  ? Breast cancer Maternal Aunt 75  ? Breast cancer Maternal Aunt 80  ? Breast cancer Maternal Aunt 40  ? Hypertension Mother   ? Pulmonary fibrosis Father   ? ? ? ?Review of Systems ? ?Constitutional: negative for  anorexia, fevers and sweats  ?Eyes: negative for irritation, redness and visual disturbance  ?Ears, nose, mouth, throat, and face: negative for earaches, epistaxis, nasal congestion and sore throat  ?Respiratory: negative for cough, dyspnea on exertion, sputum and wheezing  ?Cardiovascular: negative for chest pain, dyspnea, lower extremity edema, orthopnea, palpitations and syncope  ?Gastrointestinal: negative for abdominal pain, constipation, diarrhea, melena, nausea and vomiting  ?Genitourinary:negative for dysuria, frequency and hematuria  ?Hematologic/lymphatic: negative for bleeding, easy bruising and lymphadenopathy  ?Musculoskeletal:negative for arthralgias, muscle weakness and stiff joints  ?Neurological: negative for coordination problems, gait problems, headaches and weakness  ?Endocrine: negative for diabetic symptoms including polydipsia, polyuria and weight loss ? ?   ?Objective:  ? Physical Exam ? ?Gen. Pleasant, obese, in no distress, normal affect ?ENT - no pallor,icterus, no post nasal drip, class 2 airway, 67mm underbite ?Neck: No JVD, no thyromegaly, no carotid bruits ?Lungs: no use of accessory muscles, no dullness to percussion, decreased without rales or rhonchi  ?Cardiovascular: Rhythm regular, heart sounds  normal, no murmurs or gallops, no peripheral edema ?Abdomen: soft and non-tender, no hepatosplenomegaly, BS normal. ?Musculoskeletal: No deformities, no cyanosis or clubbing ?Neuro:  alert, non focal, no tremors ? ? ? ?   ?Assessment & Plan:  ? ?Obesity -she has lost 65 pounds noticed possible that her sleep disordered breathing is significantly improved.  She feels like she still has more weight to lose.  We will wait about a year and then repeat home sleep study before her next appointment ?

## 2021-11-05 DIAGNOSIS — F900 Attention-deficit hyperactivity disorder, predominantly inattentive type: Secondary | ICD-10-CM | POA: Insufficient documentation

## 2022-01-14 ENCOUNTER — Ambulatory Visit (HOSPITAL_COMMUNITY)
Admission: RE | Admit: 2022-01-14 | Discharge: 2022-01-14 | Disposition: A | Discharge status: Home / Self Care | Payer: BC Managed Care – PPO | Source: Ambulatory Visit | Attending: Family Medicine | Admitting: Family Medicine

## 2022-01-14 ENCOUNTER — Other Ambulatory Visit (HOSPITAL_COMMUNITY): Payer: Self-pay | Admitting: Family Medicine

## 2022-01-14 DIAGNOSIS — J069 Acute upper respiratory infection, unspecified: Secondary | ICD-10-CM | POA: Diagnosis present

## 2022-02-11 DIAGNOSIS — E875 Hyperkalemia: Secondary | ICD-10-CM | POA: Insufficient documentation

## 2022-02-12 DIAGNOSIS — M19011 Primary osteoarthritis, right shoulder: Secondary | ICD-10-CM | POA: Insufficient documentation

## 2022-02-13 ENCOUNTER — Other Ambulatory Visit (HOSPITAL_COMMUNITY): Payer: Self-pay | Admitting: Internal Medicine

## 2022-02-13 ENCOUNTER — Encounter: Payer: Self-pay | Admitting: Internal Medicine

## 2022-02-13 DIAGNOSIS — Z1231 Encounter for screening mammogram for malignant neoplasm of breast: Secondary | ICD-10-CM

## 2022-02-13 DIAGNOSIS — Z13820 Encounter for screening for osteoporosis: Secondary | ICD-10-CM

## 2022-02-26 ENCOUNTER — Other Ambulatory Visit (HOSPITAL_COMMUNITY): Payer: Self-pay | Admitting: Internal Medicine

## 2022-02-26 ENCOUNTER — Ambulatory Visit (HOSPITAL_COMMUNITY)
Admission: RE | Admit: 2022-02-26 | Discharge: 2022-02-26 | Disposition: A | Discharge status: Home / Self Care | Payer: BC Managed Care – PPO | Source: Ambulatory Visit | Attending: Internal Medicine | Admitting: Internal Medicine

## 2022-02-26 DIAGNOSIS — Z13820 Encounter for screening for osteoporosis: Secondary | ICD-10-CM

## 2022-02-26 DIAGNOSIS — Z1231 Encounter for screening mammogram for malignant neoplasm of breast: Secondary | ICD-10-CM | POA: Diagnosis present

## 2022-02-26 DIAGNOSIS — Z780 Asymptomatic menopausal state: Secondary | ICD-10-CM | POA: Insufficient documentation

## 2022-03-07 DIAGNOSIS — M6281 Muscle weakness (generalized): Secondary | ICD-10-CM | POA: Insufficient documentation

## 2022-03-29 ENCOUNTER — Other Ambulatory Visit: Payer: Self-pay

## 2022-03-29 ENCOUNTER — Encounter (HOSPITAL_COMMUNITY): Payer: Self-pay | Admitting: Emergency Medicine

## 2022-03-29 DIAGNOSIS — E039 Hypothyroidism, unspecified: Secondary | ICD-10-CM | POA: Diagnosis not present

## 2022-03-29 DIAGNOSIS — W540XXA Bitten by dog, initial encounter: Secondary | ICD-10-CM | POA: Insufficient documentation

## 2022-03-29 DIAGNOSIS — E119 Type 2 diabetes mellitus without complications: Secondary | ICD-10-CM | POA: Insufficient documentation

## 2022-03-29 DIAGNOSIS — Y9289 Other specified places as the place of occurrence of the external cause: Secondary | ICD-10-CM | POA: Diagnosis not present

## 2022-03-29 DIAGNOSIS — S51811A Laceration without foreign body of right forearm, initial encounter: Secondary | ICD-10-CM | POA: Insufficient documentation

## 2022-03-29 DIAGNOSIS — Z23 Encounter for immunization: Secondary | ICD-10-CM | POA: Diagnosis not present

## 2022-03-29 DIAGNOSIS — Y9389 Activity, other specified: Secondary | ICD-10-CM | POA: Diagnosis not present

## 2022-03-29 DIAGNOSIS — Y998 Other external cause status: Secondary | ICD-10-CM | POA: Insufficient documentation

## 2022-03-29 DIAGNOSIS — I5032 Chronic diastolic (congestive) heart failure: Secondary | ICD-10-CM | POA: Insufficient documentation

## 2022-03-29 LAB — CBG MONITORING, ED: Glucose-Capillary: 127 mg/dL — ABNORMAL HIGH (ref 70–99)

## 2022-03-29 MED ORDER — TETANUS-DIPHTH-ACELL PERTUSSIS 5-2.5-18.5 LF-MCG/0.5 IM SUSY
0.5000 mL | PREFILLED_SYRINGE | Freq: Once | INTRAMUSCULAR | Status: AC
  Administered 2022-03-29: 0.5 mL via INTRAMUSCULAR
  Filled 2022-03-29: qty 0.5

## 2022-03-29 NOTE — ED Notes (Signed)
Area cleaned with betadine and wrapped.

## 2022-03-29 NOTE — ED Triage Notes (Signed)
Pt trying to break up a dog fight and her dog bit her on right anterior fa. Gapping wound noted with bleeding controlled. Covered in triage. States her dog is updated on shots. nad

## 2022-03-30 ENCOUNTER — Emergency Department (HOSPITAL_COMMUNITY): Payer: BC Managed Care – PPO

## 2022-03-30 ENCOUNTER — Emergency Department (HOSPITAL_COMMUNITY)
Admission: EM | Admit: 2022-03-30 | Discharge: 2022-03-30 | Disposition: A | Discharge status: Home / Self Care | Payer: BC Managed Care – PPO | Attending: Emergency Medicine | Admitting: Emergency Medicine

## 2022-03-30 DIAGNOSIS — S51811A Laceration without foreign body of right forearm, initial encounter: Secondary | ICD-10-CM

## 2022-03-30 DIAGNOSIS — W540XXA Bitten by dog, initial encounter: Secondary | ICD-10-CM

## 2022-03-30 MED ORDER — AMOXICILLIN-POT CLAVULANATE 875-125 MG PO TABS
1.0000 | ORAL_TABLET | Freq: Once | ORAL | Status: AC
  Administered 2022-03-30: 1 via ORAL
  Filled 2022-03-30: qty 1

## 2022-03-30 MED ORDER — LIDOCAINE-EPINEPHRINE (PF) 2 %-1:200000 IJ SOLN
20.0000 mL | Freq: Once | INTRAMUSCULAR | Status: AC
  Administered 2022-03-30: 20 mL

## 2022-03-30 MED ORDER — AMOXICILLIN-POT CLAVULANATE 875-125 MG PO TABS: 1.0000 | ORAL_TABLET | Freq: Two times a day (BID) | ORAL | 0 refills | Status: AC

## 2022-03-30 NOTE — ED Provider Notes (Signed)
[facility]-EMERGENCY DEPT [facility] Emergency Department Provider Note MRN:  [id number]  Arrival date & time: 03/30/22     Chief Complaint   Animal Bite   History of Present Illness   Virginia Marks is a 66 y.o. year-old female with a history of diabetes presenting to the ED with chief complaint of dog bite.  Patient explains that a dog wandered into her backyard and started to fight between her 2 dogs.  She was bit by her own dog in the right forearm.  There is a flap laceration.  The dogs are vaccinated.  No other injuries or complaints.  Review of Systems  A thorough review of systems was obtained and all systems are negative except as noted in the HPI and PMH.   Patient's Health History    Past Medical History:  Diagnosis Date   Bilateral lower extremity edema    Chronic diastolic CHF (congestive heart failure) (HCC) 2017   cardiologist--- dr j. branch-   Depression    GERD (gastroesophageal reflux disease)    Hyperlipidemia    Hypertension    Hypothyroidism    followed by pcp   Lumbar post-laminectomy syndrome    OAB (overactive bladder)    OSA on CPAP    Psoriasis    SCALP   Psoriatic arthritis [facility])    rheumatologist--- dr Dierdre Forth   Spinal stenosis    SUI (stress urinary incontinence, female)    Type 2 diabetes mellitus (HCC)    followed by pcp   Wears hearing aid in both ears     Past Surgical History:  Procedure Laterality Date   ANTERIOR CERVICAL DECOMP/DISCECTOMY FUSION  09/2015   C7 -- T1   BREAST EXCISIONAL BIOPSY Right x2  2001 and 2002   both negative   CYSTOSCOPY N/A 03/27/2020   Procedure: CYSTOSCOPY;  Surgeon: Noel Christmas, MD;  Location: [facility];  Service: Urology;  Laterality: N/A;   LAPAROSCOPIC CHOLECYSTECTOMY  2001   LUMBAR FUSION  10/2014   L3 --- S1   PUBOVAGINAL SLING N/A 03/27/2020   Procedure: [name] MID URETHRAL SLING;  Surgeon: Noel Christmas, MD;  Location: [facility];  Service: Urology;   Laterality: N/A;  75 MINS   ROTATOR CUFF REPAIR Right 2019   TONSILLECTOMY  child   TOTAL ABDOMINAL HYSTERECTOMY W/ BILATERAL SALPINGOOPHORECTOMY  2003    Family History  Problem Relation Age of Onset   Breast cancer Sister 39   Breast cancer Maternal Aunt 15   Breast cancer Maternal Aunt 24   Breast cancer Maternal Aunt 42   Breast cancer Maternal Aunt 30   Hypertension Mother    Pulmonary fibrosis Father     Social History   Socioeconomic History   Marital status: Divorced    Spouse name: Not on file   Number of children: Not on file   Years of education: Not on file   Highest education level: Not on file  Occupational History   Occupation: Runner, broadcasting/film/video  Tobacco Use   Smoking status: Never   Smokeless tobacco: Never  Vaping Use   Vaping Use: Never used  Substance and Sexual Activity   Alcohol use: Yes    Comment: rare   Drug use: Never   Sexual activity: Not on file  Other Topics Concern   Not on file  Social History Narrative   Not on file   Social Determinants of Health   Financial Resource Strain: Not on file  Food Insecurity: Not on  file  Transportation Needs: Not on file  Physical Activity: Not on file  Stress: Not on file  Social Connections: Not on file  Intimate Partner Violence: Not on file     Physical Exam   Vitals:   03/30/22 0053  BP: (!) 159/65  Pulse: 99  Resp: 17  Temp: 98.3 F (36.8 C)  SpO2: 95%    CONSTITUTIONAL: Well-appearing, NAD NEURO/PSYCH:  Alert and oriented x 3, no focal deficits EYES:  eyes equal and reactive ENT/NECK:  no LAD, no JVD CARDIO: Regular rate, well-perfused, normal S1 and S2 PULM:  CTAB no wheezing or rhonchi GI/GU:  non-distended, non-tender MSK/SPINE:  No gross deformities, no edema SKIN: Flap laceration to the right anterior forearm   *Additional and/or pertinent findings included in MDM below  Diagnostic and Interventional Summary    EKG Interpretation  Date/Time:    Ventricular Rate:    PR  Interval:    QRS Duration:   QT Interval:    QTC Calculation:   R Axis:     Text Interpretation:         Labs Reviewed  CBG MONITORING, ED - Abnormal; Notable for the following components:      Result Value   Glucose-Capillary 127 (*)    All other components within normal limits    DG Forearm Right  Final Result      Medications  amoxicillin-clavulanate (AUGMENTIN) 875-125 MG per tablet 1 tablet (has no administration in time range)  Tdap (BOOSTRIX) injection 0.5 mL (0.5 mLs Intramuscular Given 03/29/22 2131)  lidocaine-EPINEPHrine (XYLOCAINE W/EPI) 2 %-1:200000 (PF) injection 20 mL (20 mLs Other Given 03/30/22 0129)     Procedures  /  Critical Care .Marland KitchenLaceration Repair  Date/Time: 03/30/2022 1:56 AM  Performed by: Sabas Sous, MD Authorized by: Sabas Sous, MD   Consent:    Consent obtained:  Verbal   Consent given by:  Patient   Risks, benefits, and alternatives were discussed: yes     Risks discussed:  Infection, need for additional repair, nerve damage, poor wound healing, poor cosmetic result, pain, retained foreign body, tendon damage and vascular damage Universal protocol:    Procedure explained and questions answered to patient or proxy's satisfaction: yes     Immediately prior to procedure, a time out was called: yes     Patient identity confirmed:  Verbally with patient Anesthesia:    Anesthesia method:  Local infiltration   Local anesthetic:  Lidocaine 2% WITH epi Laceration details:    Location:  Shoulder/arm   Shoulder/arm location:  R lower arm   Length (cm):  4   Depth (mm):  3 Pre-procedure details:    Preparation:  Patient was prepped and draped in usual sterile fashion Exploration:    Limited defect created (wound extended): no     Hemostasis achieved with:  Tied off vessels   Wound exploration: wound explored through full range of motion and entire depth of wound visualized     Wound extent: fascia not violated, no foreign body, no  signs of injury, no nerve damage, no tendon damage, no underlying fracture and no vascular damage     Contaminated: no   Treatment:    Area cleansed with:  Soap and water   Amount of cleaning:  Extensive   Undermining:  Minimal   Layers/structures repaired:  Deep subcutaneous Deep subcutaneous:    Suture size:  5-0   Wound subcutaneous closure material used: Fast-absorbing Vicryl.   Suture technique:  Figure  eight   Number of sutures:  1 Skin repair:    Repair method:  Sutures   Suture size:  4-0   Suture material:  Prolene   Suture technique:  Simple interrupted   Number of sutures:  9 Approximation:    Approximation:  Loose Repair type:    Repair type:  Intermediate Post-procedure details:    Dressing:  Sterile dressing   Procedure completion:  Tolerated well, no immediate complications Comments:     Single subcutaneous figure-of-eight to control small arterial bleeding.   ED Course and Medical Decision Making  Initial Impression and Ddx Dog bite, flap laceration, does not seem to be any other injuries.  Will x-ray to exclude foreign body, update tetanus.  Dog is vaccinated, no need for rabies series.  Past medical/surgical history that increases complexity of ED encounter: Diabetes  Interpretation of Diagnostics I personally reviewed the forearm x-ray and my interpretation is as follows: No fracture or foreign body    Patient Reassessment and Ultimate Disposition/Management     Repaired as described above, will place on Augmentin.  Patient management required discussion with the following services or consulting groups:  None  Complexity of Problems Addressed Acute complicated illness or Injury  Additional Data Reviewed and Analyzed Further history obtained from: Further history from spouse/family member  Additional Factors Impacting ED Encounter Risk Minor Procedures  Elmer Sow. Pilar Plate, MD [facility] Emergency Medicine [facility]  Health [email or web address]  Final Clinical Impressions(s) / ED Diagnoses     ICD-10-CM   1. Dog bite, initial encounter  W54.0XXA     2. Laceration of right forearm, initial encounter  S51.811A       ED Discharge Orders          Ordered    amoxicillin-clavulanate (AUGMENTIN) 875-125 MG tablet  Every 12 hours        03/30/22 0152             Discharge Instructions Discussed with and Provided to Patient:    Discharge Instructions      You were evaluated in the Emergency Department and after careful evaluation, we did not find any emergent condition requiring admission or further testing in the hospital.  Your exam/testing today was overall reassuring.  We repaired your laceration here in the emergency department.  Important that you take the Augmentin antibiotic as directed to prevent infection.  The sutures will need to be removed by healthcare professional in 10 to 14 days.  Please return to the Emergency Department if you experience any worsening of your condition.  Thank you for allowing [place] to be a part of your care.       Sabas Sous, MD 03/30/22 [phone]

## 2022-03-30 NOTE — Discharge Instructions (Signed)
You were evaluated in the Emergency Department and after careful evaluation, we did not find any emergent condition requiring admission or further testing in the hospital.  Your exam/testing today was overall reassuring.  We repaired your laceration here in the emergency department.  Important that you take the Augmentin antibiotic as directed to prevent infection.  The sutures will need to be removed by healthcare professional in 10 to 14 days.  Please return to the Emergency Department if you experience any worsening of your condition.  Thank you for allowing [place] to be a part of your care.

## 2022-05-14 ENCOUNTER — Emergency Department (HOSPITAL_COMMUNITY)
Admission: EM | Admit: 2022-05-14 | Discharge: 2022-05-14 | Disposition: A | Discharge status: Home / Self Care | Payer: BC Managed Care – PPO | Attending: Emergency Medicine | Admitting: Emergency Medicine

## 2022-05-14 ENCOUNTER — Other Ambulatory Visit: Payer: Self-pay

## 2022-05-14 ENCOUNTER — Emergency Department (HOSPITAL_COMMUNITY): Payer: BC Managed Care – PPO

## 2022-05-14 ENCOUNTER — Encounter (HOSPITAL_COMMUNITY): Payer: Self-pay | Admitting: *Deleted

## 2022-05-14 DIAGNOSIS — R079 Chest pain, unspecified: Secondary | ICD-10-CM | POA: Insufficient documentation

## 2022-05-14 DIAGNOSIS — I110 Hypertensive heart disease with heart failure: Secondary | ICD-10-CM | POA: Insufficient documentation

## 2022-05-14 DIAGNOSIS — E119 Type 2 diabetes mellitus without complications: Secondary | ICD-10-CM | POA: Insufficient documentation

## 2022-05-14 DIAGNOSIS — I5032 Chronic diastolic (congestive) heart failure: Secondary | ICD-10-CM | POA: Insufficient documentation

## 2022-05-14 DIAGNOSIS — Z91040 Latex allergy status: Secondary | ICD-10-CM | POA: Diagnosis not present

## 2022-05-14 DIAGNOSIS — E039 Hypothyroidism, unspecified: Secondary | ICD-10-CM | POA: Diagnosis not present

## 2022-05-14 DIAGNOSIS — M1991 Primary osteoarthritis, unspecified site: Secondary | ICD-10-CM | POA: Insufficient documentation

## 2022-05-14 DIAGNOSIS — Z79899 Other long term (current) drug therapy: Secondary | ICD-10-CM | POA: Diagnosis not present

## 2022-05-14 LAB — CBC
HCT: 42.1 % (ref 36.0–46.0)
Hemoglobin: 13.4 g/dL (ref 12.0–15.0)
MCH: 30.2 pg (ref 26.0–34.0)
MCHC: 31.8 g/dL (ref 30.0–36.0)
MCV: 95 fL (ref 80.0–100.0)
Platelets: 336 10*3/uL (ref 150–400)
RBC: 4.43 MIL/uL (ref 3.87–5.11)
RDW: 12.9 % (ref 11.5–15.5)
WBC: 6.1 10*3/uL (ref 4.0–10.5)
nRBC: 0 % (ref 0.0–0.2)

## 2022-05-14 LAB — BASIC METABOLIC PANEL
Anion gap: 7 (ref 5–15)
BUN: 15 mg/dL (ref 8–23)
CO2: 29 mmol/L (ref 22–32)
Calcium: 9.2 mg/dL (ref 8.9–10.3)
Chloride: 101 mmol/L (ref 98–111)
Creatinine, Ser: 0.67 mg/dL (ref 0.44–1.00)
GFR, Estimated: >60 mL/min (ref >60)
Glucose, Bld: 92 mg/dL (ref 70–99)
Potassium: 4.1 mmol/L (ref 3.5–5.1)
Sodium: 137 mmol/L (ref 135–145)

## 2022-05-14 LAB — TROPONIN I (HIGH SENSITIVITY)
Troponin I (High Sensitivity): <2 ng/L (ref <18)
Troponin I (High Sensitivity): <2 ng/L (ref <18)

## 2022-05-14 NOTE — ED Provider Notes (Signed)
[place] EMERGENCY DEPARTMENT AT [facility] Provider Note   CSN: [id number] Arrival date & time: 05/14/22  1555     History  Chief Complaint  Patient presents with   Chest Pain    Virginia Marks is a 67 y.o. female with HTN, chronic diastolic heart failure, GERD, hypothyroidism, LVH, HLD, OSA on CPAP, SUI, spinal stenosis, psoriasis, OA, MDD who presents with chest pain.   Pt brought in by [facility] with c/o left sided chest pain that started at 1440. Had nitroglycerin prescription for 3 years and has only taken it 5 times. Took 3 nitroglycerin today and had relief.  States that today she was at her work as a Runner, broadcasting/film/video and became upset emotionally with the child and then began having the chest pain.  She states that this is exactly what occurred 1 year ago when she had to take nitroglycerin as well.  Patient also states that today is the 1 year anniversary of the burial of her mother which she realizes also has made her emotionally upset today.  Usually was a sharp shooting pain and had just a discomfort after the 3rd one, then went away. Hasn't had any recurrence of the pain since then. Denies palpitations, SOB, N/V, abdominal pain, cough, recent illnesses, f/c, leg swelling, recent hospitalizations surgeries or immobilizations, hormone use. Patient sees Dr. Wyline Mood for cardiology.  Currently feels well and at her baseline.  EKG NSR for EMS, BP 108/60, HR 75, O2 sat 100% RA, RR 16.   Chest Pain      Home Medications Prior to Admission medications   Medication Sig Start Date End Date Taking? Authorizing Provider  amphetamine-dextroamphetamine (ADDERALL) 10 MG tablet Take 10 mg by mouth daily with breakfast. 02/12/22  Yes [provider]  Biotin 10 MG CAPS Take 1 capsule by mouth daily.   Yes [provider]  buPROPion (WELLBUTRIN XL) 150 MG 24 hr tablet Take 150 mg by mouth daily.  01/13/19  Yes [provider]  CALCIUM-VITAMIN D PO Take 1 tablet by mouth  daily. Calcium w/ Vitamin D3 5,000 units   Yes [provider]  docusate sodium (COLACE) 100 MG capsule Take 100 mg by mouth at bedtime as needed for mild constipation.   Yes [provider]  DULoxetine (CYMBALTA) 60 MG capsule Take 60 mg by mouth at bedtime. 12/09/18  Yes [provider]  estradiol (ESTRACE) 0.1 MG/GM vaginal cream Place 1 Applicatorful vaginally as directed.   Yes [provider]  furosemide (LASIX) 40 MG tablet Take 40 mg daily, may take additional 20 mg for swelling 04/04/19  Yes Branch, Dorothe Pea, MD  gabapentin (NEURONTIN) 300 MG capsule Take 300 mg by mouth 3 (three) times daily.    Yes [provider]  Glucosamine-Chondroitin 1500-1200 MG/30ML LIQD Take 1 capsule by mouth daily.   Yes [provider]  metoprolol tartrate (LOPRESSOR) 25 MG tablet TAKE 1/2 TABLET BY MOUTH 2 TIMES A DAY. Patient taking differently: Take 12.5 mg by mouth 2 (two) times daily. TAKE 1/2 TABLET BY MOUTH 2 TIMES A DAY. 08/24/19  Yes Branch, Dorothe Pea, MD  nitroGLYCERIN (NITROSTAT) 0.4 MG SL tablet Place 1 tablet (0.4 mg total) under the tongue every 5 (five) minutes as needed for chest pain. 02/13/21 05/14/22 Yes Branch, Dorothe Pea, MD  olmesartan (BENICAR) 20 MG tablet Take 20 mg by mouth at bedtime. 12/09/18  Yes [provider]  Omega-3 Fatty Acids (FISH OIL PO) Take 1 capsule by mouth daily.  Yes [provider]  omeprazole (PRILOSEC) 40 MG capsule Take 40 mg by mouth daily.  11/28/18  Yes [provider]  ondansetron (ZOFRAN-ODT) 8 MG disintegrating tablet Take 8 mg by mouth every 8 (eight) hours as needed for vomiting or nausea.   Yes [provider]  OVER THE COUNTER MEDICATION Take 1 tablet by mouth daily. Flinstone vitamins   Yes [provider]  potassium chloride (KLOR-CON) 10 MEQ tablet Take 10 mEq by mouth daily. 02/12/22  Yes [provider]  pravastatin (PRAVACHOL) 20 MG tablet Take 20  mg by mouth at bedtime. 12/22/19  Yes [provider]  Secukinumab, 300 MG Dose, (COSENTYX SENSOREADY, 300 MG,) 150 MG/ML SOAJ 300mg    Yes [provider]  Semaglutide, 1 MG/DOSE, (OZEMPIC, 1 MG/DOSE,) 4 MG/3ML SOPN Inject 1 mg into the skin once a week. Sunday   Yes [provider]  XIGDUO XR 01-999 MG TB24 Take 1 tablet by mouth daily. 12/22/20  Yes [provider]  levothyroxine (SYNTHROID) 75 MCG tablet Take 75 mcg by mouth daily before breakfast.  01/12/19   [provider]      Allergies    Latex and Sulfa antibiotics    Review of Systems   Review of Systems  Cardiovascular:  Positive for chest pain.   Review of systems Negative for f/c, leg swelling.  A 10 point review of systems was performed and is negative unless otherwise reported in HPI.  Physical Exam Updated Vital Signs BP (!) 106/58   Pulse 74   Temp 97.9 F (36.6 C) (Oral)   Resp 16   Ht 5\' 4"  (1.626 m)   Wt 86 kg   SpO2 98%   BMI 32.54 kg/m  Physical Exam General: Normal appearing female, lying in bed.  HEENT: PERRLA, Sclera anicteric, MMM, trachea midline.  Cardiology: RRR, no murmurs/rubs/gallops. BL radial and DP pulses equal bilaterally.  Resp: Normal respiratory rate and effort. CTAB, no wheezes, rhonchi, crackles.  Abd: Soft, non-tender, non-distended. No rebound tenderness or guarding.  GU: Deferred. MSK: No peripheral edema or signs of trauma. Extremities without deformity or TTP. No cyanosis or clubbing. Skin: warm, dry. No rashes or lesions. Back: No CVA tenderness Neuro: A&Ox4, CNs II-XII grossly intact. MAEs. Sensation grossly intact.  Psych: Normal mood and affect.   ED Results / Procedures / Treatments   Labs (all labs ordered are listed, but only abnormal results are displayed) Labs Reviewed  BASIC METABOLIC PANEL  CBC  TROPONIN I (HIGH SENSITIVITY)  TROPONIN I (HIGH SENSITIVITY)    EKG EKG Interpretation  Date/Time:  Wednesday May 14 2022 16:16:31 EST Ventricular Rate:  70 PR Interval:  180 QRS Duration: 90 QT Interval:  394 QTC Calculation: 425 R Axis:   -49 Text Interpretation: Normal sinus rhythm Confirmed by Cindee Lame [phone]) on 05/14/2022 5:31:34 PM  Radiology DG Chest 2 View  Result Date: 05/14/2022 CLINICAL DATA:  Chest pain. EXAM: CHEST - 2 VIEW COMPARISON:  Chest radiographs 01/14/2022 FINDINGS: The cardiomediastinal silhouette is unchanged with normal heart size. The lungs are well inflated and clear. No pleural effusion or pneumothorax is identified. No acute osseous abnormality is seen. IMPRESSION: No active cardiopulmonary disease. Electronically Signed   By: Logan Bores M.D.   On: 05/14/2022 16:42    Procedures Procedures    Medications Ordered in ED Medications - No data to display  ED Course/ Medical Decision Making/ A&P  Medical Decision Making Amount and/or Complexity of Data Reviewed Labs: ordered. Decision-making details documented in ED Course. Radiology: ordered. Decision-making details documented in ED Course.    This patient presents to the ED for concern of chest pain, this involves an extensive number of treatment options, and is a complaint that carries with it a high risk of complications and morbidity.  I considered the following differential and admission for this acute, potentially life threatening condition.   MDM:    DDX for chest pain includes but is not limited to: For patient's chest pain control with nitroglycerin concerning for ACS, arrhythmia, angina.  Patient states that she has had this pain before control nitroglycerin about a year ago.  She uses nitroglycerin very infrequently.  She is currently chest pain-free and is otherwise very well.  No associated shortness of breath or hypoxia, cannot PERC out due to age, but low concern for PE, patient has no risk factors for PE.  She has no numbness tingling abdominal pain or pain rating to her back,  low concern for aortic dissection.  No cough or fever or chills to suggest pneumonia. Will obtain labs/trop and CTM. No abdominal pain and no c/f biliary disease. Consider GERD, given known history.   EKG w/ NSR, no signs of ischemia.  HEART score 3. Patient with neg repeat troponin. She is instructed to call her cardiologist in the Am and make a f/u appt in the next week. Patient is given DC instructions/return precautions and all questions answered to patient satisfaction.  Clinical Course as of 05/14/22 2004  Wed May 14, 2022  1731 DG Chest 2 View FINDINGS: The cardiomediastinal silhouette is unchanged with normal heart size. The lungs are well inflated and clear. No pleural effusion or pneumothorax is identified. No acute osseous abnormality is seen.  IMPRESSION: No active cardiopulmonary disease.   [HN]  1732 BMP, CBC, trop wnl [HN]  2002 Troponin I (High Sensitivity): <2 [HN]    Clinical Course User Index [HN] Audley Hose, MD    Labs: I Ordered, and personally interpreted labs.  The pertinent results include:  those lsited above  Imaging Studies ordered: Imaging studies including CXR ordered from triage I independently visualized and interpreted imaging. I agree with the radiologist interpretation  Additional history obtained from chart review.  Cardiac Monitoring: The patient was maintained on a cardiac monitor.  I personally viewed and interpreted the cardiac monitored which showed an underlying rhythm of: NSR  Reevaluation: After the interventions noted above, I reevaluated the patient and found that they have :resolved  Social Determinants of Health: Patient lives independently   Disposition: DC  Co morbidities that complicate the patient evaluation  Past Medical History:  Diagnosis Date   Bilateral lower extremity edema    Chronic diastolic CHF (congestive heart failure) ([place]) 2017   cardiologist--- dr j. branch-   Depression    GERD  (gastroesophageal reflux disease)    Hyperlipidemia    Hypertension    Hypothyroidism    followed by pcp   Lumbar post-laminectomy syndrome    OAB (overactive bladder)    OSA on CPAP    Psoriasis    SCALP   Psoriatic arthritis [facility])    rheumatologist--- dr Amil Amen   Spinal stenosis    SUI (stress urinary incontinence, female)    Type 2 diabetes mellitus ([place])    followed by pcp   Wears hearing aid in both ears      Medicines No orders of the defined types  were placed in this encounter.   I have reviewed the patients home medicines and have made adjustments as needed  Problem List / ED Course: Problem List Items Addressed This Visit   None Visit Diagnoses     Chest pain, unspecified type    -  Primary                   This note was created using dictation software, which may contain spelling or grammatical errors.    Audley Hose, MD 05/14/22 2004

## 2022-05-14 NOTE — ED Triage Notes (Signed)
Pt brought in by [facility] with c/o left sided chest pain that started at 1440. Pt took 3 tablets of Nitro with some relief in pain, but not all. EKG NSR for EMS, BP 108/60, HR 75, O2 sat 100% RA, RR 16.
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
## 2022-05-14 NOTE — Discharge Instructions (Signed)
Thank you for coming to [facility] Emergency Department. You were seen for chest pain. We did an exam, labs, and imaging, and these showed no acute findings.  Please follow up with your cardiologist within 1 week. You can call his office in the morning to make an appointment.  Do not hesitate to return to the ED or call 911 if you experience: -Worsening symptoms -Chest pain, shortness of breath -Palpitations -Lightheadedness, passing out -Fevers/chills -Anything else that concerns you

## 2022-06-24 DIAGNOSIS — G629 Polyneuropathy, unspecified: Secondary | ICD-10-CM | POA: Insufficient documentation

## 2022-06-24 DIAGNOSIS — M8580 Other specified disorders of bone density and structure, unspecified site: Secondary | ICD-10-CM | POA: Insufficient documentation

## 2022-06-25 ENCOUNTER — Encounter: Payer: Self-pay | Admitting: Internal Medicine
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
## 2022-08-20 ENCOUNTER — Ambulatory Visit: Payer: BC Managed Care – PPO | Admitting: Cardiology

## 2022-08-21 ENCOUNTER — Ambulatory Visit: Payer: BC Managed Care – PPO | Admitting: Pulmonary Disease

## 2022-08-21 ENCOUNTER — Encounter: Payer: Self-pay | Admitting: Pulmonary Disease

## 2022-08-21 VITALS — BP 121/66 | HR 82 | Ht 65.0 in | Wt 196.4 lb

## 2022-08-21 DIAGNOSIS — G4733 Obstructive sleep apnea (adult) (pediatric): Secondary | ICD-10-CM

## 2022-08-21 DIAGNOSIS — R0609 Other forms of dyspnea: Secondary | ICD-10-CM | POA: Diagnosis not present

## 2022-08-21 DIAGNOSIS — R918 Other nonspecific abnormal finding of lung field: Secondary | ICD-10-CM | POA: Diagnosis not present

## 2022-08-21 DIAGNOSIS — I272 Pulmonary hypertension, unspecified: Secondary | ICD-10-CM | POA: Diagnosis not present
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
## 2022-08-21 NOTE — Assessment & Plan Note (Signed)
No clear cause identified.  It is reassuring that she does not desaturate on walking. Given finding of enlarged RA/RV on CT chest, we will screen for pulm hypertension with echo.  Reassuring that CT chest was with contrast and did not show pulm emboli

## 2022-08-21 NOTE — Patient Instructions (Signed)
X Ct chest wo con in mid -August  X echo to screen for pulm hypertension  X Amb sat

## 2022-08-21 NOTE — Assessment & Plan Note (Signed)
She has good compliance and has benefited greatly from CPAP.  Will continue current therapy. At some point, we should repeat home sleep test given her significant weight loss  Weight loss encouraged, compliance with goal of at least 4-6 hrs every night is the expectation. Advised against medications with sedative side effects Cautioned against driving when sleepy - understanding that sleepiness will vary on a day to day basis

## 2022-08-21 NOTE — Assessment & Plan Note (Signed)
Incidental finding on CT chest.  No prior CT available and these nodules are not visualized on prior chest x-rays.  3-month follow-up CT chest without contrast will be arranged

## 2022-09-03 ENCOUNTER — Ambulatory Visit (HOSPITAL_COMMUNITY)
Admission: RE | Admit: 2022-09-03 | Discharge: 2022-09-03 | Disposition: A | Discharge status: Home / Self Care | Payer: BC Managed Care – PPO | Source: Ambulatory Visit | Attending: Pulmonary Disease | Admitting: Pulmonary Disease

## 2022-09-03 DIAGNOSIS — E119 Type 2 diabetes mellitus without complications: Secondary | ICD-10-CM | POA: Diagnosis not present

## 2022-09-03 DIAGNOSIS — E785 Hyperlipidemia, unspecified: Secondary | ICD-10-CM | POA: Insufficient documentation

## 2022-09-03 DIAGNOSIS — G4730 Sleep apnea, unspecified: Secondary | ICD-10-CM | POA: Insufficient documentation

## 2022-09-03 DIAGNOSIS — I272 Pulmonary hypertension, unspecified: Secondary | ICD-10-CM

## 2022-09-03 DIAGNOSIS — I509 Heart failure, unspecified: Secondary | ICD-10-CM | POA: Diagnosis not present

## 2022-09-03 DIAGNOSIS — I110 Hypertensive heart disease with heart failure: Secondary | ICD-10-CM | POA: Diagnosis not present

## 2022-09-03 LAB — ECHOCARDIOGRAM COMPLETE
Area-P 1/2: 4.83 cm2
Calc EF: 71.3 %
MV VTI: 3.49 cm2
S' Lateral: 2.6 cm
Single Plane A2C EF: 73.6 %
Single Plane A4C EF: 68 %

## 2022-09-03 NOTE — Progress Notes (Signed)
  Echocardiogram 2D Echocardiogram has been performed.  Milda Smart 09/03/2022, 3:53 PM

## 2022-11-24 ENCOUNTER — Encounter: Payer: Self-pay | Admitting: Internal Medicine

## 2022-12-01 ENCOUNTER — Ambulatory Visit (HOSPITAL_COMMUNITY)
Admission: RE | Admit: 2022-12-01 | Discharge: 2022-12-01 | Disposition: A | Discharge status: Home / Self Care | Payer: BC Managed Care – PPO | Source: Ambulatory Visit | Attending: Pulmonary Disease | Admitting: Pulmonary Disease

## 2022-12-01 DIAGNOSIS — R918 Other nonspecific abnormal finding of lung field: Secondary | ICD-10-CM | POA: Insufficient documentation

## 2022-12-09 ENCOUNTER — Encounter: Payer: Self-pay | Admitting: Pulmonary Disease

## 2022-12-09 ENCOUNTER — Ambulatory Visit: Payer: BC Managed Care – PPO | Admitting: Pulmonary Disease

## 2022-12-09 ENCOUNTER — Telehealth: Payer: Self-pay

## 2022-12-09 VITALS — BP 113/53 | HR 80 | Ht 65.0 in | Wt 217.0 lb

## 2022-12-09 DIAGNOSIS — G4733 Obstructive sleep apnea (adult) (pediatric): Secondary | ICD-10-CM | POA: Diagnosis not present

## 2022-12-09 DIAGNOSIS — R918 Other nonspecific abnormal finding of lung field: Secondary | ICD-10-CM | POA: Diagnosis not present

## 2022-12-09 NOTE — Assessment & Plan Note (Signed)
She is back on her CPAP machine.  She has tried both nasal pillows and nasal mask.  Will obtain a download in 1 month and review her settings  Weight loss encouraged, compliance with goal of at least 4-6 hrs every night is the expectation. Advised against medications with sedative side effects Cautioned against driving when sleepy - understanding that sleepiness will vary on a day to day basis
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
## 2022-12-09 NOTE — Assessment & Plan Note (Signed)
Stable on 32-month follow-up CT chest Will obtain 6 months follow-up scan We discussed incidental findings of coronary atherosclerosis and aggressive risk factor control

## 2022-12-09 NOTE — Patient Instructions (Signed)
X Ct chest wo con in  6 months  Continue on CPAP

## 2022-12-09 NOTE — Telephone Encounter (Signed)
CALL REPORT:  IMPRESSION: 1. At least 5 solid pulmonary nodules scattered in the lungs bilaterally, largest 0.6 cm in the posterior left lower lobe. If the prior outside chest CT images become available for comparison, an addendum can be issued at that time. Non-contrast chest CT at 3-6 months is recommended. If the nodules are stable at time of repeat CT, then future CT at 18-24 months (from today's scan) is considered optional for low-risk patients, but is recommended for high-risk patients. This recommendation follows the consensus statement: Guidelines for Management of Incidental Pulmonary Nodules Detected on CT Images: From the Fleischner Society 2017; Radiology 2017; 284:228-243. 2. Three-vessel coronary atherosclerosis. 3.  Aortic Atherosclerosis (ICD10-I70.0).  Confirmed receipt with [facility] Forwarded to RA

## 2023-01-07 ENCOUNTER — Encounter: Payer: Self-pay | Admitting: Cardiology

## 2023-01-07 ENCOUNTER — Ambulatory Visit: Payer: BC Managed Care – PPO | Attending: Cardiology | Admitting: Cardiology

## 2023-01-07 VITALS — BP 118/68 | HR 86 | Ht 64.0 in | Wt 225.0 lb

## 2023-01-07 DIAGNOSIS — I5032 Chronic diastolic (congestive) heart failure: Secondary | ICD-10-CM

## 2023-01-07 DIAGNOSIS — I2510 Atherosclerotic heart disease of native coronary artery without angina pectoris: Secondary | ICD-10-CM | POA: Diagnosis not present

## 2023-01-07 DIAGNOSIS — E782 Mixed hyperlipidemia: Secondary | ICD-10-CM

## 2023-01-07 MED ORDER — PRAVASTATIN SODIUM 80 MG PO TABS: 80.0000 mg | ORAL_TABLET | Freq: Every evening | ORAL | 3 refills | Status: DC

## 2023-01-07 MED ORDER — ASPIRIN 81 MG PO TBEC: 81.0000 mg | DELAYED_RELEASE_TABLET | Freq: Every day | ORAL | Status: AC

## 2023-01-07 NOTE — Progress Notes (Signed)
Clinical Summary Virginia Marks is a 67 y.o.female seen today for follow up of the following medical problems.    1. LE edema/Chronic diastolic HF - 2017 echo with LVEF 60-65%, grade II diastolic dysfunction  12/2018 echo LVEF 55-60%, indet diastolic function, normal RV   08/2022 echo: LVEF 65-70%, grade I dd, normal RV fxn, normal PA pressure   - some recent SOB/DOE. No recent edema - weigh is up about 30 lbs since 08/2022. She had stopped ozempic, now restarting     2. History of chest painhest pain - 09/2017 nuclear stress no ischemia - no recent symptoms   3. Coronary atherosclerosis -11/2022 3 vessel coronary atherosclerosis noted on CT scan - no recent chest pains.    4. Palpitations - heart monitor 10/2015 with SR, sinus tach and occasional PVCs - rare symptoms        5. OSA  - uses CPAP machine, followed by Virginia Virginia Marks     6. Psoriatic arthritis - on methotrexate,    7. 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  Use to keep multiple foreign exchange students at her home each year but recently has been taking a breadk Has 3 dogs Past Medical History:  Diagnosis Date   Bilateral lower extremity edema    Chronic diastolic CHF (congestive heart failure) (HCC) 2017   cardiologist--- Virginia Marks-   Depression    GERD (gastroesophageal reflux disease)    Hyperlipidemia    Hypertension    Hypothyroidism    followed by pcp   Lumbar post-laminectomy syndrome    OAB (overactive bladder)    OSA on CPAP    Psoriasis    SCALP   Psoriatic arthritis [facility])    rheumatologist--- Virginia Marks   Spinal stenosis    SUI (stress urinary incontinence, female)    Type 2 diabetes mellitus (HCC)    followed by pcp   Wears hearing aid in both ears      Allergies  Allergen Reactions   Latex Rash   Sulfa Antibiotics Rash     Current Outpatient Medications   Medication Sig Dispense Refill   amphetamine-dextroamphetamine (ADDERALL) 10 MG tablet Take 10 mg by mouth daily with breakfast.     Biotin 10 MG CAPS Take 1 capsule by mouth daily.     buPROPion (WELLBUTRIN XL) 150 MG 24 hr tablet Take 150 mg by mouth daily.      CALCIUM-VITAMIN D PO Take 1 tablet by mouth daily. Calcium w/ Vitamin D3 5,000 units     docusate sodium (COLACE) 100 MG capsule Take 100 mg by mouth at bedtime as needed for mild constipation.     DULoxetine (CYMBALTA) 60 MG capsule Take 60 mg by mouth at bedtime.     estradiol (ESTRACE) 0.1 MG/GM vaginal cream Place 1 Applicatorful vaginally as directed.     furosemide (LASIX) 20 MG tablet Take 20 mg by mouth daily.     gabapentin (NEURONTIN) 300 MG capsule Take 300 mg by mouth 3 (three) times daily.      GEMTESA 75 MG TABS Take 75 mg by mouth daily.     Glucosamine-Chondroitin 1500-1200 MG/30ML LIQD Take 1 capsule by mouth daily.     levothyroxine (SYNTHROID) 75 MCG tablet Take 75 mcg by mouth daily before breakfast.      metoprolol tartrate (LOPRESSOR) 25  MG tablet TAKE 1/2 TABLET BY MOUTH 2 TIMES A DAY. (Patient taking differently: Take 12.5 mg by mouth 2 (two) times daily. TAKE 1/2 TABLET BY MOUTH 2 TIMES A DAY.) 90 tablet 1   nitroGLYCERIN (NITROSTAT) 0.4 MG SL tablet Place 1 tablet (0.4 mg total) under the tongue every 5 (five) minutes as needed for chest pain. 25 tablet 3   olmesartan (BENICAR) 20 MG tablet Take 20 mg by mouth at bedtime.     Omega-3 Fatty Acids (FISH OIL PO) Take 1 capsule by mouth daily.     omeprazole (PRILOSEC) 40 MG capsule Take 40 mg by mouth daily.      ondansetron (ZOFRAN-ODT) 8 MG disintegrating tablet Take 8 mg by mouth every 8 (eight) hours as needed for vomiting or nausea.     OVER THE COUNTER MEDICATION Take 1 tablet by mouth daily. Flinstone vitamins     OZEMPIC, 0.25 OR 0.5 MG/DOSE, 2 MG/3ML SOPN Take 0.25 mg weekly x4 weeks and then increase to 0.5 mg weekly     potassium chloride (KLOR-CON)  10 MEQ tablet Take 10 mEq by mouth daily.     pravastatin (PRAVACHOL) 40 MG tablet Take 40 mg by mouth daily.     Secukinumab, 300 MG Dose, (COSENTYX SENSOREADY, 300 MG,) 150 MG/ML SOAJ 300mg      XIGDUO XR 01-999 MG TB24 Take 1 tablet by mouth daily.     No current facility-administered medications for this visit.     Past Surgical History:  Procedure Laterality Date   ANTERIOR CERVICAL DECOMP/DISCECTOMY FUSION  09/2015   C7 -- T1   BREAST EXCISIONAL BIOPSY Right x2  2001 and 2002   both negative   CYSTOSCOPY N/A 03/27/2020   Procedure: CYSTOSCOPY;  Surgeon: Virginia Christmas, MD;  Location: [facility];  Service: Urology;  Laterality: N/A;   LAPAROSCOPIC CHOLECYSTECTOMY  2001   LUMBAR FUSION  10/2014   L3 --- S1   PUBOVAGINAL SLING N/A 03/27/2020   Procedure: Virginia Marks MID URETHRAL SLING;  Surgeon: Virginia Christmas, MD;  Location: [facility];  Service: Urology;  Laterality: N/A;  75 MINS   ROTATOR CUFF REPAIR Right 2019   TONSILLECTOMY  child   TOTAL ABDOMINAL HYSTERECTOMY W/ BILATERAL SALPINGOOPHORECTOMY  2003     Allergies  Allergen Reactions   Latex Rash   Sulfa Antibiotics Rash      Family History  Problem Relation Age of Onset   Breast cancer Sister 54   Breast cancer Maternal Aunt 71   Breast cancer Maternal Aunt 70   Breast cancer Maternal Aunt 80   Breast cancer Maternal Aunt 34   Hypertension Mother    Pulmonary fibrosis Father      Social History Virginia Marks reports that she has never smoked. She has never used smokeless tobacco. Virginia Marks reports current alcohol use.   Review of Systems CONSTITUTIONAL: No weight loss, fever, chills, weakness or fatigue.  HEENT: Eyes: No visual loss, blurred vision, double vision or yellow sclerae.No hearing loss, sneezing, congestion, runny nose or sore throat.  SKIN: No rash or itching.  CARDIOVASCULAR: per hpi RESPIRATORY: per hpi GASTROINTESTINAL: No anorexia, nausea, vomiting or  diarrhea. No abdominal pain or blood.  GENITOURINARY: No burning on urination, no polyuria NEUROLOGICAL: No headache, dizziness, syncope, paralysis, ataxia, numbness or tingling in the extremities. No change in bowel or bladder control.  MUSCULOSKELETAL: No muscle, back pain, joint pain or stiffness.  LYMPHATICS: No enlarged nodes. No history of splenectomy.  PSYCHIATRIC: No history of depression or anxiety.  ENDOCRINOLOGIC: No reports of sweating, cold or heat intolerance. No polyuria or polydipsia.  Marland Kitchen   Physical Examination Today's Vitals   01/07/23 0811  BP: 118/68  Pulse: 86  SpO2: 97%  Weight: 225 lb (102.1 kg)  Height: 5\' 4"  (1.626 m)   Body mass index is 38.62 kg/m.  Gen: resting comfortably, no acute distress HEENT: no scleral icterus, pupils equal round and reactive, no palptable cervical adenopathy,  CV: RRR, no mrg, no jvd Resp: Clear to auscultation bilaterally GI: abdomen is soft, non-tender, non-distended, normal bowel sounds, no hepatosplenomegaly MSK: extremities are warm, no edema.  Skin: warm, no rash Neuro:  no focal deficits Psych: appropriate affect   Diagnostic Studies  10/2015 echo Study Conclusions   - Left ventricle: The cavity size was normal. Wall thickness was   increased in a pattern of mild LVH. Systolic function was normal.   The estimated ejection fraction was in the range of 60% to 65%.   Wall motion was normal; there were no regional wall motion   abnormalities. Features are consistent with a pseudonormal left   ventricular filling pattern, with concomitant abnormal relaxation   and increased filling pressure (grade 2 diastolic dysfunction). - Aortic valve: Mildly calcified annulus. Trileaflet. There was   very mild stenosis. Peak velocity (S): 200 cm/s. Mean gradient   (S): 8 mm Hg. Valve area (VTI): 1.69 cm^2. Valve area (Vmax):   1.72 cm^2. Valve area (Vmean): 1.79 cm^2. - Mitral valve: Calcified annulus.   10/2015 Heart  monitor Telemetry tracings show sinus rhythm Reported symptoms correlate with sinus rhythm and sinus tachycardia with occasional PVCs No significant arrhythmias   EKG in clinic today shows SR, LAFB.      09/2017 nuclear stress Blood pressure demonstrated a hypertensive response to exercise. There was no ST segment deviation noted during stress. Diffuse nonspecific T wave abnormalities seen throughout study. The study is normal. No myocardial ischemia or scar. Rest images affected by artifact. This is a low risk study. Nuclear stress EF: 66%.     12/2018 echo IMPRESSIONS      1. Left ventricular ejection fraction, by visual estimation, is 55 to 60%. The left ventricle has normal function. There is mildly increased left ventricular hypertrophy.  2. Left ventricular diastolic Doppler parameters are indeterminate pattern of LV diastolic filling.  3. Global right ventricle has normal systolic function.The right ventricular size is normal. No increase in right ventricular wall thickness.  4. Left atrial size was moderately dilated.  5. Right atrial size was normal.  6. The mitral valve is normal in structure. No evidence of mitral valve regurgitation. No evidence of mitral stenosis.  7. The tricuspid valve is not well visualized. Tricuspid valve regurgitation is trivial.  8. The aortic valve has an indeterminant number of cusps Aortic valve regurgitation was not visualized by color flow Doppler. Structurally normal aortic valve, with no evidence of sclerosis or stenosis.  9. The pulmonic valve was not well visualized. Pulmonic valve regurgitation is not visualized by color flow Doppler. 10. The inferior vena cava is normal in size with <50% respiratory variability, suggesting right atrial pressure of 8 mmHg.    08/2022 echo   1. Left ventricular ejection fraction, by estimation, is 65 to 70%. The  left ventricle has normal function. The left ventricle has no regional  wall motion  abnormalities. Left ventricular diastolic parameters are  consistent with Grade I diastolic  dysfunction (impaired relaxation).  2. Right ventricular systolic function is normal. The right ventricular  size is normal. There is normal pulmonary artery systolic pressure.   3. The mitral valve is normal in structure. Mild mitral valve  regurgitation. No evidence of mitral stenosis.   4. The aortic valve is normal in structure. Aortic valve regurgitation is  not visualized. No aortic stenosis is present.   5. The inferior vena cava is normal in size with greater than 50%  respiratory variability, suggesting right atrial pressure of 3 mmHg.    Assessment and Plan   1 Chronic diastolic HF - euvolemic today - some recent SOB/DOE but would attribute to her recent 30 lbs weight gain after stopping ozempic. A recent echo showed no significant changes, no signs of fluid overload at this time. She is already restarting ozempic and working toward weight loss - conitnue lasix at current dose.    2.  Hyperlipidemia - above goal, with coronary atheroscerlosis by CT would aim for LDL at least <100 - increase pravastatin to 80mg  daily.     3.Coronary atheroclerosis - incidtental findings on CT scan - 2019 nuclear stress no ischemia, no current symptoms - continue risk factor modification, add ASA 81mg  daily   F/u 6 months    Antoine Poche, M.D., F.A.C.C.

## 2023-01-07 NOTE — Patient Instructions (Signed)
Medication Instructions:  Your physician has recommended you make the following change in your medication:   -Start Aspirin 81 mg tablet once daily. -Increase Pravastatin to 80 mg tablet once daily.   *If you need a refill on your cardiac medications before your next appointment, please call your pharmacy*   Lab Work: None If you have labs (blood work) drawn today and your tests are completely normal, you will receive your results only by: MyChart Message (if you have MyChart) OR A paper copy in the mail If you have any lab test that is abnormal or we need to change your treatment, we will call you to review the results.   Testing/Procedures: None   Follow-Up: At [facility], you and your health needs are our priority.  As part of our continuing mission to provide you with exceptional heart care, we have created designated Provider Care Teams.  These Care Teams include your primary Cardiologist (physician) and Advanced Practice Providers (APPs -  Physician Assistants and Nurse Practitioners) who all work together to provide you with the care you need, when you need it.  We recommend signing up for the patient portal called "MyChart".  Sign up information is provided on this After Visit Summary.  MyChart is used to connect with patients for Virtual Visits (Telemedicine).  Patients are able to view lab/test results, encounter notes, upcoming appointments, etc.  Non-urgent messages can be sent to your provider as well.   To learn more about what you can do with MyChart, go to [email or web address].    Your next appointment:   6 month(s)  Provider:   You may see Dina Rich, MD or one of the following Advanced Practice Providers on your designated Care Team:   Randall An, PA-C  Jacolyn Reedy, [place]     Other Instructions

## 2023-03-03 ENCOUNTER — Other Ambulatory Visit: Payer: Self-pay | Admitting: Urology

## 2023-03-03 ENCOUNTER — Telehealth: Payer: Self-pay | Admitting: Cardiology

## 2023-03-03 NOTE — Telephone Encounter (Signed)
   Pre-operative Risk Assessment    Patient Name: Virginia Marks  DOB: 1955/06/08 MRN: [id number]      Request for Surgical Clearance    Procedure:   Cysto with bulkamid injection  Date of Surgery:  Clearance TBD                                 Surgeon:  Dr. Ander Slade Group or Practice Name:  [facility]  Phone number:  [phone] Fax number:  [phone]   Type of Clearance Requested:   - Pharmacy:  Hold Aspirin ?   Type of Anesthesia:  Not Indicated   Additional requests/questions:    [name]   03/03/2023, 5:06 PM

## 2023-03-04 ENCOUNTER — Other Ambulatory Visit: Payer: Self-pay | Admitting: Urology

## 2023-03-04 NOTE — Telephone Encounter (Signed)
Virginia Marks 67 year old female is requesting preoperative cardiac evaluation for cystoscopy with Bulkamid injection.  Procedure has not yet been scheduled.  She was recently seen by you in the clinic.  Please weigh in on upcoming procedure.  Also, may her aspirin be held prior to her procedure?  Thank you for your help.  Please direct your response to CV DIV preop pool.  Procedure has not yet been scheduled.  Thomasene Ripple. Virginia Dobesh NP-C     03/04/2023, 1:06 PM [facility] [address] Office [phone] Fax [phone]

## 2023-03-11 NOTE — Telephone Encounter (Signed)
Hi. Dr. Wyline Mood. Please see Jesse's note below. Please route response back to P CV DIV PREOP.  Thank you so much! Alice Burnside

## 2023-03-11 NOTE — Telephone Encounter (Signed)
Ok to proceed with procedure and can hold aspirin as needed  Dominga Ferry MD

## 2023-03-11 NOTE — Telephone Encounter (Signed)
     Primary Cardiologist: Dina Rich, MD  Chart reviewed as part of pre-operative protocol coverage. Given past medical history and time since last visit, based on ACC/AHA guidelines, Virginia Marks would be at acceptable risk for the planned procedure without further cardiovascular testing.   Her aspirin may be held for 5 to 7 days prior to her injection.  Please resume as soon as hemostasis is achieved.  I will route this recommendation to the requesting party via Epic fax function and remove from pre-op pool.  Please call with questions.  Thomasene Ripple. Snyder Colavito NP-C     03/11/2023, 12:59 PM [facility] [address] Office [phone] Fax [phone]

## 2023-03-30 ENCOUNTER — Encounter (HOSPITAL_BASED_OUTPATIENT_CLINIC_OR_DEPARTMENT_OTHER): Payer: Self-pay | Admitting: Urology

## 2023-03-30 NOTE — Progress Notes (Addendum)
Spoke w/ via phone for pre-op interview--- Virginia Marks needs dos----  ISTAT       Lab results------Current EKG in Epic dated 05/16/2022. COVID test -----patient states asymptomatic no test needed Arrive at -------0630 NPO after MN NO Solid Food.   Med rec completed Medications to take morning of surgery ----- Bupropion, Gabapentin, Synthroid, MEtoprolol and Prilosec. Diabetic medication -----NONE AM of procedure Patient instructed no nail polish to be worn day of surgery Patient instructed to bring photo id and insurance card day of surgery Patient aware to have Driver (ride ) / caregiver    for 24 hours after surgery - Friend Virginia Marks Patient Special Instructions -----last dose of Ozempic 03/15/23. Pt verbalized understanding that she should not take another dose of Ozempic until after procedure. Pre-Op special Instructions ----- Patient verbalized understanding of instructions that were given at this phone interview. Patient denies chest pain, sob, fever, cough at the interview.   Cardiac clearance dated 03/11/23 in chart and Epic. Instructions to hold ASA 5-7 days prior to procedure. Pt verbalized understanding.

## 2023-04-06 NOTE — H&P (Signed)
CC/HPI: cc: Mixed urinary incontinence   02/29/20: 67 year old woman with mixed urinary incontinence referred by Dr. Annabell Howells. Patient underwent urodynamics which showed positive stress urodynamics with moderate leakage. She did not have significant detrusor overactivity but was on gemtesa at the time. Patient is waiting for insurance approval for the gemtesa and has since run out. 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 She is wearing 4-5 size 7 pads daily. She has episodes of complete incontinence. She has had hysterectomy in the past.   UDS SUMMARY  Virginia Marks held a max capacity of approx. 370 mls. Her 1st sensation was felt at 89 mls. There was positive SUI with moderate leakage. No obvious instability was noted during the study (pt currently on Gemtesa). She does describe episodes of high volume leakage when going from a seated to a standing position. She was able to generate a voluntary contraction and void. Increased EMG activity was noted during voiding. PVR was approx. 50 mls. Mild trabeculation was noted. No reflux was seen.   04/12/20: 67 year old woman with a history of mixed urinary incontinence with positive stress urinary incontinence with moderate leakage and urodynamics underwent lynx mid urethral sling placement on 03/26/2020. She returns for postop follow-up. She has been doing well since the procedure. She is not having significant pain. She has not leak since the surgery.   2/245/22: Postop sling 03/26/20; patient is doing well from a stress urinary incontinence perspective but still struggling with urgency and urge incontinence. The gemtesa did help her however was not covered by her insurance company and she has since run out.   09/11/2022: Virginia Marks is a 67 year old female presents today with concerns of a rash, vaginal itching, and burning on her labia which began 3 to 4 months ago. She states that she has severe incontinence and is wearing pads. She  cannot correlate a specific pad with her symptoms. She has been using hydrocortisone cream with mild relief of itching. She denies dysuria. She endorses leakage of urine with cough, laugh, sneeze as well as urgency. She gets up 3-4 times per night. She had a mid urethral sling placed 2 years ago. She had a good year without sling as well as the use of Myrbetriq but her insurance did not cover Myrbetriq and therefore she has not been taking it.   01/30/2023: 67 year old woman with a history of mixed urinary incontinence/p mid urethral sling in 2022 now comes in with worsening urinary urgency and urge incontinence. She is taking [place] which helps the urgency during the day but she will leak upon urge in the morning and if she has an urge when she moves from sitting to standing. She wears a CPAP at night which does help her. She is wearing pads and will soak through them. She is also been having difficulty with vaginal yeast infections. She is currently on fluconazole. She does have a prescription for vaginal estradiol cream but forgets to use it. She is also wondering if she is waiting too long to urinate and suppressing the urge until it becomes an emergency.   02/18/23: 67 year old woman with a history of mixed urinary incontinence/p mid urethral sling in 2022 now comes in with worsening urinary urgency and urge incontinence. She is taking [place] which helps the urgency during the day but she will leak upon urge in the morning and if she has an urge when she moves from sitting to standing. Urodynamic study shows positive stress urinary incontinence.  UDS SUMMARY  Virginia Marks held a max capacity of approx. 300 mls. Her 1st sensation was felt at 200 mls. There was positive SUI. She leaked mildly with both coughing and Valsalva when her bladder was full. No instability was noted. She was able to generate a voluntary contraction and void 164 mls with max flow of 4 ml/s. Max detrusor pressure while voiding was  56 cmH20. Mild increased EMG activity was noted during voiding. PVR was approx. 134 mls. No trabeculation was noted. No reflux was seen. She will return for UDS follow up.     ALLERGIES: sulfa    MEDICATIONS: Estrace 0.01 % cream with applicator Apply a finger length tip amount around the urethra 3 nights per week  Gemtesa 75 mg tablet 1 tablet PO Daily  Metoprolol Tartrate 25 mg tablet  Omeprazole 40 mg capsule,delayed release  Biotin  Co Q-10  Colace 100 mg capsule  Dextroamphetamine Sulfate 10 mg tablet  Diclofenac Sodium 75 mg tablet, delayed release  Duloxetine Hcl 30 mg capsule,delayed release  Fish Oil  Fluconazole 150 mg tablet 1 tablet PO Q1WK  Furosemide 20 mg tablet  Gabapentin 300 mg capsule  Magnesium  Multivitamin With Iron  Olmesartan Medoxomil 20 mg tablet  Potassium Chloride 20 meq tablet, extended release  Pravastatin Sodium 80 mg tablet tablet  Wellbutrin Sr 150 mg tablet,sustained-release 12 hr  Xigduo Xr 10 mg-1,000 mg tablet,immed and extend rel biphase 24hr     GU PSH: Complex cystometrogram, w/ void pressure and urethral pressure profile studies, any technique - 02/12/2023, 2021 Complex Uroflow - 02/12/2023, 2021 Emg surf Electrd - 02/12/2023, 2021 Inject For cystogram - 02/12/2023, 2021 Intrabd voidng Press - 02/12/2023, 2021 Sling - 2021     NON-GU PSH: Visit Complexity (formerly GPC1X) - 01/30/2023, 11/20/2022     GU PMH: Mixed incontinence - 02/12/2023, - 01/30/2023, - 11/20/2022, - 09/11/2022, - 2022, - 2022, - 2021, - 2021 Postmenopausal atrophic vaginitis - 01/30/2023, - 11/20/2022, - 09/11/2022, - 2022, - 2022, - 2021 Urinary Urgency - 01/30/2023, - 11/20/2022, - 2022, - 2021, - 2021 Candidiasis of vulva and vagina - 11/20/2022, - 09/11/2022 Microscopic hematuria - 09/11/2022 Urinary Frequency - 2022, - 2021, - 2021    NON-GU PMH: No Non-GU PMH    FAMILY HISTORY: No Family History    SOCIAL HISTORY: Marital Status: Divorced    REVIEW OF SYSTEMS:     GU Review Female:   Patient denies frequent urination, hard to postpone urination, burning /pain with urination, get up at night to urinate, leakage of urine, stream starts and stops, trouble starting your stream, have to strain to urinate, and being pregnant.  Gastrointestinal (Upper):   Patient denies nausea, vomiting, and indigestion/ heartburn.  Gastrointestinal (Lower):   Patient denies diarrhea and constipation.  Constitutional:   Patient denies fever, night sweats, weight loss, and fatigue.  Skin:   Patient denies skin rash/ lesion and itching.  Eyes:   Patient denies blurred vision and double vision.  Ears/ Nose/ Throat:   Patient denies sore throat and sinus problems.  Hematologic/Lymphatic:   Patient denies swollen glands and easy bruising.  Cardiovascular:   Patient denies leg swelling and chest pains.  Respiratory:   Patient denies cough and shortness of breath.  Endocrine:   Patient denies excessive thirst.  Musculoskeletal:   Patient denies back pain and joint pain.  Neurological:   Patient denies headaches and dizziness.  Psychologic:   Patient denies depression and anxiety.   VITAL SIGNS: None  MULTI-SYSTEM PHYSICAL EXAMINATION:    Constitutional: Well-nourished. No physical deformities. Normally developed. Good grooming.  Neck: Neck symmetrical, not swollen. Normal tracheal position.  Respiratory: No labored breathing, no use of accessory muscles.   Skin: No paleness, no jaundice, no cyanosis. No lesion, no ulcer, no rash.  Neurologic / Psychiatric: Oriented to time, oriented to place, oriented to person. No depression, no anxiety, no agitation.  Eyes: Normal conjunctivae. Normal eyelids.  Ears, Nose, Mouth, and Throat: Left ear no scars, no lesions, no masses. Right ear no scars, no lesions, no masses. Nose no scars, no lesions, no masses. Normal hearing. Normal lips.  Musculoskeletal: Normal gait and station of head and neck.     Complexity of Data:  Records Review:    Previous Patient Records, POC Tool  Urine Test Review:   Urinalysis  Urodynamics Review:   Review Urodynamics Tests  Notes:                     11/21/2022: GFR 92   PROCEDURES:          Visit Complexity - G2211          Urinalysis Dipstick Dipstick Cont'd  Color: Yellow Bilirubin: Neg mg/dL  Appearance: Clear Ketones: Neg mg/dL  Specific Gravity: 5.188 Blood: Neg ery/uL  pH: 6.5 Protein: Neg mg/dL  Glucose: 2+ mg/dL Urobilinogen: 0.2 mg/dL    Nitrites: Neg    Leukocyte Esterase: Neg leu/uL    ASSESSMENT:      ICD-10 Details  1 GU:   Stress Incontinence - N39.3 Chronic, Worsening  2   Urinary Urgency - R39.15 Chronic, Stable  3   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 We discussed the possibility of starting with Bulkamid and discussed how that was performed. Risks and benefits of the procedure were discussed with the patient including but not limited to pain, bleeding, recurrence of incontinence, need for temporary indwelling Foley catheter, infection, continued leakage, need for additional procedure.   Urgency: Patient to continue Gemtesa   Will schedule next available Bulkamid with sedation

## 2023-04-07 ENCOUNTER — Encounter (HOSPITAL_BASED_OUTPATIENT_CLINIC_OR_DEPARTMENT_OTHER): Payer: Self-pay | Admitting: Urology

## 2023-04-07 ENCOUNTER — Ambulatory Visit (HOSPITAL_BASED_OUTPATIENT_CLINIC_OR_DEPARTMENT_OTHER): Payer: BC Managed Care – PPO | Admitting: Anesthesiology

## 2023-04-07 ENCOUNTER — Ambulatory Visit (HOSPITAL_BASED_OUTPATIENT_CLINIC_OR_DEPARTMENT_OTHER)
Admission: RE | Admit: 2023-04-07 | Discharge: 2023-04-07 | Disposition: A | Discharge status: Home / Self Care | Payer: BC Managed Care – PPO | Attending: Urology | Admitting: Urology

## 2023-04-07 ENCOUNTER — Encounter (HOSPITAL_BASED_OUTPATIENT_CLINIC_OR_DEPARTMENT_OTHER)
Admission: RE | Disposition: A | Discharge status: Home / Self Care | Payer: Self-pay | Source: Home / Self Care | Attending: Urology

## 2023-04-07 DIAGNOSIS — I10 Essential (primary) hypertension: Secondary | ICD-10-CM

## 2023-04-07 DIAGNOSIS — E1165 Type 2 diabetes mellitus with hyperglycemia: Secondary | ICD-10-CM

## 2023-04-07 DIAGNOSIS — N3946 Mixed incontinence: Secondary | ICD-10-CM | POA: Insufficient documentation

## 2023-04-07 DIAGNOSIS — N952 Postmenopausal atrophic vaginitis: Secondary | ICD-10-CM | POA: Diagnosis not present

## 2023-04-07 HISTORY — PX: CYSTOSCOPY WITH INJECTION: SHX1424

## 2023-04-07 LAB — POCT I-STAT, CHEM 8
BUN: 17 mg/dL (ref 8–23)
Calcium, Ion: 1.21 mmol/L (ref 1.15–1.40)
Chloride: 104 mmol/L (ref 98–111)
Creatinine, Ser: 0.8 mg/dL (ref 0.44–1.00)
Glucose, Bld: 120 mg/dL — ABNORMAL HIGH (ref 70–99)
HCT: 39 % (ref 36.0–46.0)
Hemoglobin: 13.3 g/dL (ref 12.0–15.0)
Potassium: 4.4 mmol/L (ref 3.5–5.1)
Sodium: 141 mmol/L (ref 135–145)
TCO2: 27 mmol/L (ref 22–32)

## 2023-04-07 SURGERY — CYSTOSCOPY, WITH INJECTION OF BLADDER NECK OR BLADDER WALL
Anesthesia: General | Site: Urethra

## 2023-04-07 MED ORDER — PROPOFOL 500 MG/50ML IV EMUL
INTRAVENOUS | Status: AC
  Filled 2023-04-07: qty 50

## 2023-04-07 MED ORDER — KETOROLAC TROMETHAMINE 30 MG/ML IJ SOLN
INTRAMUSCULAR | Status: AC
  Filled 2023-04-07: qty 1

## 2023-04-07 MED ORDER — OXYCODONE HCL 5 MG/5ML PO SOLN: 5.0000 mg | Freq: Once | ORAL | Status: DC | PRN

## 2023-04-07 MED ORDER — PHENYLEPHRINE 80 MCG/ML (10ML) SYRINGE FOR IV PUSH (FOR BLOOD PRESSURE SUPPORT)
PREFILLED_SYRINGE | INTRAVENOUS | Status: DC | PRN
  Administered 2023-04-07: 80 ug via INTRAVENOUS
  Administered 2023-04-07: 160 ug via INTRAVENOUS
  Administered 2023-04-07 (×2): 80 ug via INTRAVENOUS

## 2023-04-07 MED ORDER — ACETAMINOPHEN 500 MG PO TABS
ORAL_TABLET | ORAL | Status: AC
  Filled 2023-04-07: qty 2

## 2023-04-07 MED ORDER — ONDANSETRON HCL 4 MG/2ML IJ SOLN: 4.0000 mg | Freq: Once | INTRAMUSCULAR | Status: DC | PRN

## 2023-04-07 MED ORDER — EPHEDRINE 5 MG/ML INJ
INTRAVENOUS | Status: AC
  Filled 2023-04-07: qty 5

## 2023-04-07 MED ORDER — KETOROLAC TROMETHAMINE 30 MG/ML IJ SOLN
INTRAMUSCULAR | Status: DC | PRN
  Administered 2023-04-07: 15 mg via INTRAVENOUS

## 2023-04-07 MED ORDER — ONDANSETRON HCL 4 MG/2ML IJ SOLN
INTRAMUSCULAR | Status: AC
  Filled 2023-04-07: qty 2

## 2023-04-07 MED ORDER — SODIUM CHLORIDE 0.9 % IV SOLN: INTRAVENOUS | Status: DC

## 2023-04-07 MED ORDER — CEFAZOLIN SODIUM-DEXTROSE 2-4 GM/100ML-% IV SOLN
2.0000 g | Freq: Once | INTRAVENOUS | Status: AC
  Administered 2023-04-07: 2 g via INTRAVENOUS

## 2023-04-07 MED ORDER — PROPOFOL 10 MG/ML IV BOLUS
INTRAVENOUS | Status: DC | PRN
  Administered 2023-04-07: 50 mg via INTRAVENOUS
  Administered 2023-04-07: 150 mg via INTRAVENOUS

## 2023-04-07 MED ORDER — OXYCODONE HCL 5 MG PO TABS: 5.0000 mg | ORAL_TABLET | Freq: Once | ORAL | Status: DC | PRN

## 2023-04-07 MED ORDER — HYDROMORPHONE HCL 1 MG/ML IJ SOLN: 0.2500 mg | INTRAMUSCULAR | Status: DC | PRN

## 2023-04-07 MED ORDER — CEFAZOLIN SODIUM-DEXTROSE 2-4 GM/100ML-% IV SOLN
INTRAVENOUS | Status: AC
  Filled 2023-04-07: qty 100

## 2023-04-07 MED ORDER — ONDANSETRON HCL 4 MG/2ML IJ SOLN
INTRAMUSCULAR | Status: DC | PRN
  Administered 2023-04-07: 4 mg via INTRAVENOUS

## 2023-04-07 MED ORDER — LIDOCAINE HCL (PF) 2 % IJ SOLN
INTRAMUSCULAR | Status: AC
  Filled 2023-04-07: qty 5

## 2023-04-07 MED ORDER — DEXAMETHASONE SODIUM PHOSPHATE 10 MG/ML IJ SOLN
INTRAMUSCULAR | Status: AC
  Filled 2023-04-07: qty 1

## 2023-04-07 MED ORDER — FENTANYL CITRATE (PF) 100 MCG/2ML IJ SOLN
INTRAMUSCULAR | Status: DC | PRN
  Administered 2023-04-07: 25 ug via INTRAVENOUS

## 2023-04-07 MED ORDER — AMISULPRIDE (ANTIEMETIC) 5 MG/2ML IV SOLN: 10.0000 mg | Freq: Once | INTRAVENOUS | Status: DC | PRN

## 2023-04-07 MED ORDER — MIDAZOLAM HCL 2 MG/2ML IJ SOLN
INTRAMUSCULAR | Status: AC
Start: 2023-04-07 — End: ?
  Filled 2023-04-07: qty 2

## 2023-04-07 MED ORDER — FENTANYL CITRATE (PF) 100 MCG/2ML IJ SOLN
INTRAMUSCULAR | Status: AC
  Filled 2023-04-07: qty 2

## 2023-04-07 MED ORDER — LIDOCAINE HCL (CARDIAC) PF 100 MG/5ML IV SOSY
PREFILLED_SYRINGE | INTRAVENOUS | Status: DC | PRN
  Administered 2023-04-07: 60 mg via INTRAVENOUS

## 2023-04-07 MED ORDER — WATER FOR IRRIGATION, STERILE IR SOLN
Status: DC | PRN
  Administered 2023-04-07: 3000 mL via URETHRAL

## 2023-04-07 MED ORDER — ACETAMINOPHEN 500 MG PO TABS
1000.0000 mg | ORAL_TABLET | Freq: Once | ORAL | Status: AC
  Administered 2023-04-07: 1000 mg via ORAL

## 2023-04-07 SURGICAL SUPPLY — 18 items
BAG DRAIN URO-CYSTO SKYTR STRL (DRAIN) ×1 IMPLANT
CLOTH BEACON ORANGE TIMEOUT ST (SAFETY) ×1 IMPLANT
ELECT REM PT RETURN 9FT ADLT (ELECTROSURGICAL) IMPLANT
ELECTRODE REM PT RTRN 9FT ADLT (ELECTROSURGICAL) ×1 IMPLANT
GLOVE BIO SURGEON STRL SZ 6.5 (GLOVE) ×1 IMPLANT
GOWN STRL REUS W/TWL LRG LVL3 (GOWN DISPOSABLE) ×1 IMPLANT
KIT TURNOVER CYSTO (KITS) ×1 IMPLANT
MANIFOLD NEPTUNE II (INSTRUMENTS) ×1 IMPLANT
NDL ASPIRATION 22 (NEEDLE) IMPLANT
NDL SAFETY ECLIPSE 18X1.5 (NEEDLE) ×1 IMPLANT
NEEDLE ASPIRATION 22 (NEEDLE) IMPLANT
PACK CYSTO (CUSTOM PROCEDURE TRAY) ×1 IMPLANT
SLEEVE SCD COMPRESS KNEE MED (STOCKING) ×1 IMPLANT
SYR 20ML LL LF (SYRINGE) ×1 IMPLANT
SYR CONTROL 10ML LL (SYRINGE) ×1 IMPLANT
SYSTEM URETHRAL BULK BULKAMID (Female Continence) IMPLANT
TUBE CONNECTING 12X1/4 (SUCTIONS) ×1 IMPLANT
WATER STERILE IRR 3000ML UROMA (IV SOLUTION) ×1 IMPLANT

## 2023-04-07 NOTE — Op Note (Signed)
 Operative Note   Preoperative diagnosis:  1.  Stress urinary incontinence   Postoperative diagnosis: 1.  Stress urinary incontinence   Procedure(s): 1.  Cystoscopy with injection of bulkamid   Surgeon: Valli Shank, MD   Assistants:  None   Anesthesia:  General   Complications:  None   EBL:  minimal   Specimens: 1. none   Drains/Catheters: 1.  none   Intraoperative findings:   Normal urethra Bilateral orthotopic ureteral orifices Normal bladder mucosa without masses   Indication:  67 yo woman with symptomatic stress urinary incontinence who previously underwent midurethral sling but persistent SUI documented on UDS.   Description of procedure:   After risks and benefits of the procedure discussed with the patient, informed consent was obtained.  The patient was taken to the operating placed in the supine position.  Anesthesia was induced and antibiotics were administered.  The patient was then repositioned in the dorsolithotomy position.  She was prepped and draped in usual sterile fashion a timeout performed with the attending present.  The cystoscope was assembled with the Bulkamid system.  It was then placed in the urethral meatus and advanced into the bladder under direct visualization.  Prior cystoscopy had been done which noted normal anatomic landmarks.  These were again verified during cystoscopy today.  The cystoscope was brought back to the bladder neck and the needle was advanced through the needle guide at the 1 o'clock position.  Once it was visualized and advanced it was rotated to the 5 o'clock position.  Bulkamid was then injected until a bleb was seen.  This was then repeated at the 1 o'clock position in the 7 o'clock position until coaptation was noted.   This concluded the case.  The patient's bladder was left with approximately 200 cc of sterile saline.  The patient emerged from anesthesia and was transferred the PACU in stable condition.   Plan:   Plan for patient to void in PACU prior to discharge.

## 2023-04-07 NOTE — Anesthesia Procedure Notes (Signed)
 Procedure Name: LMA Insertion Date/Time: 04/07/2023 8:30 AM  Performed by: Alejo Delon DASEN, CRNAPre-anesthesia Checklist: Patient identified, Emergency Drugs available, Suction available and Patient being monitored Patient Re-evaluated:Patient Re-evaluated prior to induction Oxygen Delivery Method: Circle system utilized Preoxygenation: Pre-oxygenation with 100% oxygen Induction Type: IV induction Ventilation: Mask ventilation without difficulty LMA: LMA inserted LMA Size: 4.0 Number of attempts: 2 Airway Equipment and Method: Bite block Placement Confirmation: positive ETCO2 Tube secured with: Tape Dental Injury: Teeth and Oropharynx as per pre-operative assessment

## 2023-04-07 NOTE — Progress Notes (Signed)
Spoke with Dr. Salvadore Farber about pt's b/p. Pt asymptomatic.  Given order to discharge pt.  Instructed pt to take b/p tonight before taking b/p med.  Pt states she doesn't take b/p med if it's less than 100/60.

## 2023-04-07 NOTE — Discharge Instructions (Addendum)
 Cystoscopy with Bulkamid patient instructions  Following a cystoscopy, a catheter (a flexible rubber tube) is sometimes left in place to empty the bladder. This may cause some discomfort or a feeling that you need to urinate. Your doctor determines the period of time that the catheter will be left in place. You may have bloody urine for two to three days (Call your doctor if the amount of bleeding increases or does not subside).  You may pass blood clots in your urine, especially if you had a biopsy. It is not unusual to pass small blood clots and have some bloody urine a couple of weeks after your cystoscopy. Again, call your doctor if the bleeding does not subside. You may have: Dysuria (painful urination) Frequency (urinating often) Urgency (strong desire to urinate)  These symptoms are common especially if medicine is instilled into the bladder or a ureteral stent is placed. Avoiding alcohol and caffeine, such as coffee, tea, and chocolate, may help relieve these symptoms. Drink plenty of water , unless otherwise instructed. Your doctor may also prescribe an antibiotic or other medicine to reduce these symptoms.  Cystoscopy results are available soon after the procedure; biopsy results usually take two to four days. Your doctor will discuss the results of your exam with you. Before you go home, you will be given specific instructions for follow-up care. Special Instructions:   If you are going home with a catheter in place do not take a tub bath until removed by your doctor.   You may resume your normal activities.   Do not drive or operate machinery if you are taking narcotic pain medicine.   Be sure to keep all follow-up appointments with your doctor.   Call Your Doctor If: The catheter is not draining You have severe pain You are unable to urinate You have a fever over 101 You have severe bleeding          Post Anesthesia Home Care Instructions  Activity: Get plenty of rest for  the remainder of the day. A responsible individual must stay with you for 24 hours following the procedure.  For the next 24 hours, DO NOT: -Drive a car -Advertising copywriter -Drink alcoholic beverages -Take any medication unless instructed by your physician -Make any legal decisions or sign important papers.  Meals: Start with liquid foods such as gelatin or soup. Progress to regular foods as tolerated. Avoid greasy, spicy, heavy foods. If nausea and/or vomiting occur, drink only clear liquids until the nausea and/or vomiting subsides. Call your physician if vomiting continues.  Special Instructions/Symptoms: Your throat may feel dry or sore from the anesthesia or the breathing tube placed in your throat during surgery. If this causes discomfort, gargle with warm salt water . The discomfort should disappear within 24 hours.  May take Tylenol  beginning at 1:15 PM as needed for soreness/discomfort.

## 2023-04-07 NOTE — Transfer of Care (Signed)
 Immediate Anesthesia Transfer of Care Note  Patient: Virginia Marks  Procedure(s) Performed: CYSTOSCOPY WITH BULKAMID  INJECTION (Bladder)  Patient Location: PACU  Anesthesia Type:General  Level of Consciousness: awake, alert , and oriented  Airway & Oxygen Therapy: Patient Spontanous Breathing and Patient connected to nasal cannula oxygen  Post-op Assessment: Report given to RN  Post vital signs: Reviewed and stable  Last Vitals:  Vitals Value Taken Time  BP 107/49   Temp    Pulse 66 04/07/23 0851  Resp    SpO2 98 % 04/07/23 0851  Vitals shown include unfiled device data.  Last Pain:  Vitals:   04/07/23 0713  TempSrc: Oral  PainSc: 1       Patients Stated Pain Goal: 4 (04/07/23 0713)  Complications: No notable events documented.

## 2023-04-07 NOTE — Anesthesia Postprocedure Evaluation (Signed)
 Anesthesia Post Note  Patient: Virginia Marks  Procedure(s) Performed: CYSTOSCOPY WITH BULKAMID  INJECTION (Bladder)     Patient location during evaluation: PACU Anesthesia Type: General Level of consciousness: awake and alert, oriented and patient cooperative Pain management: pain level controlled Vital Signs Assessment: post-procedure vital signs reviewed and stable Respiratory status: spontaneous breathing, nonlabored ventilation and respiratory function stable Cardiovascular status: blood pressure returned to baseline and stable Postop Assessment: no apparent nausea or vomiting Anesthetic complications: no   No notable events documented.  Last Vitals:  Vitals:   04/07/23 0900 04/07/23 0915  BP: (!) 104/46 (!) 107/37  Pulse: 66 63  Resp: 17 11  Temp:    SpO2: 98% 98%    Last Pain:  Vitals:   04/07/23 0915  TempSrc:   PainSc: 0-No pain                 [name]

## 2023-04-07 NOTE — Interval H&P Note (Signed)
 History and Physical Interval Note:  04/07/2023 7:30 AM  Virginia Marks  has presented today for surgery, with the diagnosis of STRESS URINARY INCONTINENCE.  The various methods of treatment have been discussed with the patient and family. After consideration of risks, benefits and other options for treatment, the patient has consented to  Procedure(s) with comments: CYSTOSCOPY WITH BULKAMID  INJECTION (N/A) - 30 MINUTES as a surgical intervention.  The patient's history has been reviewed, patient examined, no change in status, stable for surgery.  I have reviewed the patient's chart and labs.  Questions were answered to the patient's satisfaction.     Letasha Kershaw D Kabeer Hoagland

## 2023-04-07 NOTE — Anesthesia Preprocedure Evaluation (Addendum)
 Anesthesia Evaluation  Patient identified by MRN, date of birth, ID band Patient awake    Reviewed: Allergy & Precautions, H&P , NPO status , Patient's Chart, lab work & pertinent test results, reviewed documented beta blocker date and time   Airway Mallampati: III  TM Distance: >3 FB Neck ROM: Full    Dental  (+) Teeth Intact, Dental Advisory Given   Pulmonary sleep apnea and Continuous Positive Airway Pressure Ventilation    Pulmonary exam normal breath sounds clear to auscultation       Cardiovascular hypertension (116/55 preop), Pt. on medications and Pt. on home beta blockers Normal cardiovascular exam+ Valvular Problems/Murmurs (mild MR) MR  Rhythm:Regular Rate:Normal  Echo 08/2022  1. Left ventricular ejection fraction, by estimation, is 65 to 70%. The  left ventricle has normal function. The left ventricle has no regional  wall motion abnormalities. Left ventricular diastolic parameters are  consistent with Grade I diastolic  dysfunction (impaired relaxation).   2. Right ventricular systolic function is normal. The right ventricular  size is normal. There is normal pulmonary artery systolic pressure.   3. The mitral valve is normal in structure. Mild mitral valve  regurgitation. No evidence of mitral stenosis.   4. The aortic valve is normal in structure. Aortic valve regurgitation is  not visualized. No aortic stenosis is present.   5. The inferior vena cava is normal in size with greater than 50%  respiratory variability, suggesting right atrial pressure of 3 mmHg.     Neuro/Psych  PSYCHIATRIC DISORDERS  Depression    negative neurological ROS     GI/Hepatic Neg liver ROS,GERD  Medicated and Controlled,,  Endo/Other  diabetes, Well Controlled, Type 2, Oral Hypoglycemic AgentsHypothyroidism  Obesity BMI 37  Renal/GU negative Renal ROS Bladder dysfunction (stress urinary incontinence)      Musculoskeletal  (+)  Arthritis , Osteoarthritis,    Abdominal  (+) + obese  Peds negative pediatric ROS (+)  Hematology negative hematology ROS (+)   Anesthesia Other Findings Ozempic LD: 3 weeks ago  Reproductive/Obstetrics negative OB ROS                             Anesthesia Physical Anesthesia Plan  ASA: 3  Anesthesia Plan: General   Post-op Pain Management: Tylenol  PO (pre-op)*   Induction: Intravenous  PONV Risk Score and Plan: Dexamethasone , Ondansetron , Midazolam  and Treatment may vary due to age or medical condition  Airway Management Planned: LMA  Additional Equipment: None  Intra-op Plan:   Post-operative Plan: Extubation in OR  Informed Consent: I have reviewed the patients History and Physical, chart, labs and discussed the procedure including the risks, benefits and alternatives for the proposed anesthesia with the patient or authorized representative who has indicated his/her understanding and acceptance.     Dental advisory given  Plan Discussed with: CRNA  Anesthesia Plan Comments:        Anesthesia Quick Evaluation

## 2023-04-09 ENCOUNTER — Encounter (HOSPITAL_BASED_OUTPATIENT_CLINIC_OR_DEPARTMENT_OTHER): Payer: Self-pay | Admitting: Urology

## 2023-05-19 ENCOUNTER — Other Ambulatory Visit: Payer: Self-pay | Admitting: Urology

## 2023-05-27 NOTE — Progress Notes (Addendum)
 COVID Vaccine Completed:  Yes  Date of COVID positive in last 90 days:  PCP - Nita Sells, MD Cardiologist - Dina Rich, MD Pulmonologist - Cyril Mourning, MD  Cardiac clearance in Epic dated 03-11-23  Chest x-ray - CT chest 12-01-22 Epic EKG -  Stress Test - 09-25-17 Epic ECHO - 09-03-22 Epic Cardiac Cath -  Pacemaker/ICD device last checked: Spinal Cord Stimulator: Non-telemetry monitoring - 10-31-15 Epic  Bowel Prep -   Sleep Study - Yes, +sleep apnea CPAP -   Fasting Blood Sugar -  Checks Blood Sugar _____ times a day  Ozempic Last dose of GLP1 agonist-  N/A GLP1 instructions:  Hold 7 days before surgery    Xigduo Last dose of SGLT-2 inhibitors-  N/A SGLT-2 instructions:  Hold 3 days before surgery    Blood Thinner Instructions:  Last dose:   Time: Aspirin Instructions:  ASA 81.  Hold x1 week  Last Dose:  Activity level:  Can go up a flight of stairs and perform activities of daily living without stopping and without symptoms of chest pain or shortness of breath.  Able to exercise without symptoms  Unable to go up a flight of stairs without symptoms of     Anesthesia review:  CHF, HTN, OSA  Patient denies shortness of breath, fever, cough and chest pain at PAT appointment  Patient verbalized understanding of instructions that were given to them at the PAT appointment. Patient was also instructed that they will need to review over the PAT instructions again at home before surgery.

## 2023-05-27 NOTE — Patient Instructions (Addendum)
 SURGICAL WAITING ROOM VISITATION Patients having surgery or a procedure may have no more than 2 support people in the waiting area - these visitors may rotate.    Children under the age of 68 must have an adult with them who is not the patient.  Due to an increase in RSV and influenza rates and associated hospitalizations, children ages 10 and under may not visit patients in [facility] hospitals.   If the patient needs to stay at the hospital during part of their recovery, the visitor guidelines for inpatient rooms apply. Pre-op nurse will coordinate an appropriate time for 1 support person to accompany patient in pre-op.  This support person may not rotate.    Please refer to the [facility] website for the visitor guidelines for Inpatients (after your surgery is over and you are in a regular room).       Your procedure is scheduled on: 06-11-23   Report to [facility] Main Entrance    Report to admitting at 9:45 AM   Call this number if you have problems the morning of surgery [phone]   Do not eat food or drink liquids :After Midnight.           If you have questions, please contact your surgeon's office.   FOLLOW ANY ADDITIONAL PRE OP INSTRUCTIONS YOU RECEIVED FROM YOUR SURGEON'S OFFICE!!!     Oral Hygiene is also important to reduce your risk of infection.                                    Remember - BRUSH YOUR TEETH THE MORNING OF SURGERY WITH YOUR REGULAR TOOTHPASTE   Do NOT smoke after Midnight   Take these medicines the morning of surgery with A SIP OF WATER:    Bupropion   Gabapentin   Gemtesa   Levothyroxine   Metoprolol   Omeprazole   Cosentyx  Stop all vitamins and herbal supplements 7 days before surgery  How to Manage Your Diabetes Before and After Surgery  Why is it important to control my blood sugar before and after surgery? Improving blood sugar levels before and after surgery helps healing and can limit problems. A way of improving  blood sugar control is eating a healthy diet by:  Eating less sugar and carbohydrates  Increasing activity/exercise  Talking with your doctor about reaching your blood sugar goals High blood sugars (greater than 180 mg/dL) can raise your risk of infections and slow your recovery, so you will need to focus on controlling your diabetes during the weeks before surgery. Make sure that the doctor who takes care of your diabetes knows about your planned surgery including the date and location.  How do I manage my blood sugar before surgery? Check your blood sugar at least 4 times a day, starting 2 days before surgery, to make sure that the level is not too high or low. Check your blood sugar the morning of your surgery when you wake up and every 2 hours until you get to the Short Stay unit. If your blood sugar is less than 70 mg/dL, you will need to treat for low blood sugar: Do not take insulin. Treat a low blood sugar (less than 70 mg/dL) with  cup of clear juice (cranberry or apple), 4 glucose tablets, OR glucose gel. Recheck blood sugar in 15 minutes after treatment (to make sure it is greater than 70  mg/dL). If your blood sugar is not greater than 70 mg/dL on recheck, call [phone] for further instructions. Report your blood sugar to the short stay nurse when you get to Short Stay.  If you are admitted to the hospital after surgery: Your blood sugar will be checked by the staff and you will probably be given insulin after surgery (instead of oral diabetes medicines) to make sure you have good blood sugar levels. The goal for blood sugar control after surgery is 80-180 mg/dL.   WHAT DO I DO ABOUT MY DIABETES MEDICATION?  Do not take oral diabetes medicines (pills) the morning of surgery.  Hold Zigduo 3 days before surgery (do not take after 06-07-23)  Hold Ozempic 7 days before surgery (do not take after 06-03-23).       DO NOT TAKE THE FOLLOWING 7 DAYS PRIOR TO SURGERY: Ozempic,  Wegovy, Rybelsus (Semaglutide), Byetta (exenatide), Bydureon (exenatide ER), Victoza, Saxenda (liraglutide), or Trulicity (dulaglutide) Mounjaro (Tirzepatide) Adlyxin (Lixisenatide), Polyethylene Glycol Loxenatide.  Reviewed and Endorsed by [facility] Patient Education Committee, August 2015  Bring CPAP mask and tubing day of surgery.                              You may not have any metal on your body including hair pins, jewelry, and body piercing             Do not wear make-up, lotions, powders, perfumes, or deodorant  Do not wear nail polish including gel and S&S, artificial/acrylic nails, or any other type of covering on natural nails including finger and toenails. If you have artificial nails, gel coating, etc. that needs to be removed by a nail salon please have this removed prior to surgery or surgery may need to be canceled/ delayed if the surgeon/ anesthesia feels like they are unable to be safely monitored.   Do not shave  48 hours prior to surgery.    Do not bring valuables to the hospital. [place] IS NOT RESPONSIBLE   FOR VALUABLES.   Contacts, dentures or bridgework may not be worn into surgery.  DO NOT BRING YOUR HOME MEDICATIONS TO THE HOSPITAL. PHARMACY WILL DISPENSE MEDICATIONS LISTED ON YOUR MEDICATION LIST TO YOU DURING YOUR ADMISSION IN THE HOSPITAL!    Patients discharged on the day of surgery will not be allowed to drive home.  Someone NEEDS to stay with you for the first 24 hours after anesthesia.   Special Instructions: Bring a copy of your healthcare power of attorney and living will documents the day of surgery if you haven't scanned them before.              Please read over the following fact sheets you were given: IF YOU HAVE QUESTIONS ABOUT YOUR PRE-OP INSTRUCTIONS PLEASE CALL [phone] G[place]  If you received a COVID test during your pre-op visit  it is requested that you wear a mask when out in public, stay away from anyone that may not be feeling  well and notify your surgeon if you develop symptoms. If you test positive for Covid or have been in contact with anyone that has tested positive in the last 10 days please notify you surgeon.  [place] - Preparing for Surgery Before surgery, you can play an important role.  Because skin is not sterile, your skin needs to be as free of germs as possible.  You can reduce the number of germs on  your skin by washing with CHG (chlorahexidine gluconate) soap before surgery.  CHG is an antiseptic cleaner which kills germs and bonds with the skin to continue killing germs even after washing. Please DO NOT use if you have an allergy to CHG or antibacterial soaps.  If your skin becomes reddened/irritated stop using the CHG and inform your nurse when you arrive at Short Stay. Do not shave (including legs and underarms) for at least 48 hours prior to the first CHG shower.  You may shave your face/neck.  Please follow these instructions carefully:  1.  Shower with CHG Soap the night before surgery and the  morning of surgery.  2.  If you choose to wash your hair, wash your hair first as usual with your normal  shampoo.  3.  After you shampoo, rinse your hair and body thoroughly to remove the shampoo.                             4.  Use CHG as you would any other liquid soap.  You can apply chg directly to the skin and wash.  Gently with a scrungie or clean washcloth.  5.  Apply the CHG Soap to your body ONLY FROM THE NECK DOWN.   Do   not use on face/ open                           Wound or open sores. Avoid contact with eyes, ears mouth and   genitals (private parts).                       Wash face,  Genitals (private parts) with your normal soap.             6.  Wash thoroughly, paying special attention to the area where your    surgery  will be performed.  7.  Thoroughly rinse your body with warm water from the neck down.  8.  DO NOT shower/wash with your normal soap after using and rinsing off the CHG  Soap.                9.  Pat yourself dry with a clean towel.            10.  Wear clean pajamas.            11.  Place clean sheets on your bed the night of your first shower and do not  sleep with pets. Day of Surgery : Do not apply any lotions/deodorants the morning of surgery.  Please wear clean clothes to the hospital/surgery center.  FAILURE TO FOLLOW THESE INSTRUCTIONS MAY RESULT IN THE CANCELLATION OF YOUR SURGERY  PATIENT SIGNATURE_________________________________  NURSE SIGNATURE__________________________________  ________________________________________________________________________

## 2023-05-28 ENCOUNTER — Encounter (HOSPITAL_COMMUNITY)
Admission: RE | Admit: 2023-05-28 | Discharge: 2023-05-28 | Disposition: A | Discharge status: Home / Self Care | Payer: 59 | Source: Ambulatory Visit | Attending: Internal Medicine | Admitting: Internal Medicine

## 2023-05-28 ENCOUNTER — Other Ambulatory Visit (HOSPITAL_COMMUNITY): Payer: Self-pay | Admitting: Internal Medicine

## 2023-05-28 DIAGNOSIS — Z1231 Encounter for screening mammogram for malignant neoplasm of breast: Secondary | ICD-10-CM

## 2023-06-08 NOTE — Progress Notes (Signed)
 COVID Vaccine received:  []  No []  Yes Date of any COVID positive Test in last 90 days:  PCP - Nita Sells MD Cardiologist - Dina Rich MD  Chest x-ray -  EKG -   Stress Test -  ECHO -  Cardiac Cath -   Bowel Prep - []  No  []   Yes ______  Pacemaker / ICD device []  No []  Yes   Spinal Cord Stimulator:[]  No []  Yes       History of Sleep Apnea? []  No []  Yes   CPAP used?- []  No []  Yes    Does the patient monitor blood sugar?          []  No []  Yes  []  N/A  Patient has: []  NO Hx DM   []  Pre-DM                 []  DM1  []   DM2 Does patient have a [employer] or [employer]? []  No []  Yes   Fasting Blood Sugar Ranges-  Checks Blood Sugar _____ times a day  GLP1 agonist / usual dose -  GLP1 instructions:  SGLT-2 inhibitors / usual dose -  SGLT-2 instructions:   Blood Thinner / Instructions: Aspirin Instructions:  Comments:   Activity level: Patient is able / unable to climb a flight of stairs without difficulty; []  No CP  []  No SOB, but would have ___   Patient can / can not perform ADLs without assistance.   Anesthesia review:   Patient denies shortness of breath, fever, cough and chest pain at PAT appointment.  Patient verbalized understanding and agreement to the Pre-Surgical Instructions that were given to them at this PAT appointment. Patient was also educated of the need to review these PAT instructions again prior to his/her surgery.I reviewed the appropriate phone numbers to call if they have any and questions or concerns.

## 2023-06-08 NOTE — Patient Instructions (Signed)
 SURGICAL WAITING ROOM VISITATION  Patients having surgery or a procedure may have no more than 2 support people in the waiting area - these visitors may rotate.    Children under the age of 80 must have an adult with them who is not the patient.  Due to an increase in RSV and influenza rates and associated hospitalizations, children ages 19 and under may not visit patients in [facility] hospitals.  Visitors with respiratory illnesses are discouraged from visiting and should remain at home.  If the patient needs to stay at the hospital during part of their recovery, the visitor guidelines for inpatient rooms apply. Pre-op nurse will coordinate an appropriate time for 1 support person to accompany patient in pre-op.  This support person may not rotate.    Please refer to the [facility] website for the visitor guidelines for Inpatients (after your surgery is over and you are in a regular room).       Your procedure is scheduled on: 06/11/23   Report to [facility] Main Entrance    Report to admitting at 9:45 AM   Call this number if you have problems the morning of surgery [phone]   Do not eat food  or drink liquids:After Midnight. Except for sips of water with meds.       Oral Hygiene is also important to reduce your risk of infection.                                    Remember - BRUSH YOUR TEETH THE MORNING OF SURGERY WITH YOUR REGULAR TOOTHPASTE  DENTURES WILL BE REMOVED PRIOR TO SURGERY PLEASE DO NOT APPLY "Poly grip" OR ADHESIVES!!!   Stop all vitamins and herbal supplements 7 days before surgery.   Take these medicines the morning of surgery with A SIP OF WATER: Bupropion, Gabapentin, Gemtes, Levothyroxine, Metoprolol, Omeprazole, Cosentyx  DO NOT TAKE ANY ORAL DIABETIC MEDICATIONS DAY OF YOUR SURGERY Hold Ozempic for 7 days prior to surgery. Hold Xigduo for 72 hours prior to surgery.   Bring CPAP mask and tubing day of surgery.                               You may not have any metal on your body including hair pins, jewelry, and body piercing             Do not wear make-up, lotions, powders, perfumes/cologne, or deodorant  Do not wear nail polish including gel and S&S, artificial/acrylic nails, or any other type of covering on natural nails including finger and toenails. If you have artificial nails, gel coating, etc. that needs to be removed by a nail salon please have this removed prior to surgery or surgery may need to be canceled/ delayed if the surgeon/ anesthesia feels like they are unable to be safely monitored.   Do not shave  48 hours prior to surgery.               Men may shave face and neck.   Do not bring valuables to the hospital. [place] IS NOT             RESPONSIBLE   FOR VALUABLES.   Contacts, glasses, dentures or bridgework may not be worn into surgery.   Bring small overnight bag day of surgery.   DO NOT BRING YOUR  HOME MEDICATIONS TO THE HOSPITAL. PHARMACY WILL DISPENSE MEDICATIONS LISTED ON YOUR MEDICATION LIST TO YOU DURING YOUR ADMISSION IN THE HOSPITAL!    Patients discharged on the day of surgery will not be allowed to drive home.  Someone NEEDS to stay with you for the first 24 hours after anesthesia.   Special Instructions: Bring a copy of your healthcare power of attorney and living will documents the day of surgery if you haven't scanned them before.              Please read over the following fact sheets you were given: IF YOU HAVE QUESTIONS ABOUT YOUR PRE-OP INSTRUCTIONS PLEASE CALL [phone] Rosey [place]   If you received a COVID test during your pre-op visit  it is requested that you wear a mask when out in public, stay away from anyone that may not be feeling well and notify your surgeon if you develop symptoms. If you test positive for Covid or have been in contact with anyone that has tested positive in the last 10 days please notify you surgeon.    [place] - Preparing for Surgery Before  surgery, you can play an important role.  Because skin is not sterile, your skin needs to be as free of germs as possible.  You can reduce the number of germs on your skin by washing with CHG (chlorahexidine gluconate) soap before surgery.  CHG is an antiseptic cleaner which kills germs and bonds with the skin to continue killing germs even after washing. Please DO NOT use if you have an allergy to CHG or antibacterial soaps.  If your skin becomes reddened/irritated stop using the CHG and inform your nurse when you arrive at Short Stay. Do not shave (including legs and underarms) for at least 48 hours prior to the first CHG shower.  You may shave your face/neck.  Please follow these instructions carefully:  1.  Shower with CHG Soap the night before surgery and the  morning of surgery.  2.  If you choose to wash your hair, wash your hair first as usual with your normal  shampoo.  3.  After you shampoo, rinse your hair and body thoroughly to remove the shampoo.                             4.  Use CHG as you would any other liquid soap.  You can apply chg directly to the skin and wash.  Gently with a scrungie or clean washcloth.  5.  Apply the CHG Soap to your body ONLY FROM THE NECK DOWN.   Do   not use on face/ open                           Wound or open sores. Avoid contact with eyes, ears mouth and   genitals (private parts).                       Wash face,  Genitals (private parts) with your normal soap.             6.  Wash thoroughly, paying special attention to the area where your    surgery  will be performed.  7.  Thoroughly rinse your body with warm water from the neck down.  8.  DO NOT shower/wash with your normal soap after using and rinsing off the CHG Soap.  9.  Pat yourself dry with a clean towel.            10.  Wear clean pajamas.            11.  Place clean sheets on your bed the night of your first shower and do not  sleep with pets. Day of Surgery : Do not apply  any lotions/deodorants the morning of surgery.  Please wear clean clothes to the hospital/surgery center.  FAILURE TO FOLLOW THESE INSTRUCTIONS MAY RESULT IN THE CANCELLATION OF YOUR SURGERY  PATIENT SIGNATURE_________________________________  NURSE SIGNATURE__________________________________  ________________________________________________________________________

## 2023-06-09 ENCOUNTER — Encounter (HOSPITAL_COMMUNITY)
Admission: RE | Admit: 2023-06-09 | Discharge: 2023-06-09 | Disposition: A | Discharge status: Home / Self Care | Payer: 59 | Source: Ambulatory Visit | Attending: Anesthesiology | Admitting: Anesthesiology

## 2023-06-09 ENCOUNTER — Ambulatory Visit (HOSPITAL_COMMUNITY): Payer: 59

## 2023-06-09 DIAGNOSIS — E119 Type 2 diabetes mellitus without complications: Secondary | ICD-10-CM

## 2023-06-09 DIAGNOSIS — I10 Essential (primary) hypertension: Secondary | ICD-10-CM

## 2023-06-11 ENCOUNTER — Ambulatory Visit (HOSPITAL_COMMUNITY): Admission: RE | Admit: 2023-06-11 | Payer: 59 | Source: Home / Self Care | Admitting: Urology

## 2023-06-11 ENCOUNTER — Encounter (HOSPITAL_COMMUNITY): Admission: RE | Payer: Self-pay | Source: Home / Self Care

## 2023-06-11 SURGERY — CYSTOSCOPY
Anesthesia: General

## 2023-06-12 ENCOUNTER — Ambulatory Visit (HOSPITAL_COMMUNITY): Payer: 59

## 2023-08-01 ENCOUNTER — Ambulatory Visit (HOSPITAL_COMMUNITY)
Admission: RE | Admit: 2023-08-01 | Discharge: 2023-08-01 | Disposition: A | Discharge status: Home / Self Care | Source: Ambulatory Visit | Attending: Pulmonary Disease | Admitting: Pulmonary Disease

## 2023-08-01 DIAGNOSIS — R918 Other nonspecific abnormal finding of lung field: Secondary | ICD-10-CM | POA: Diagnosis present

## 2023-08-03 ENCOUNTER — Ambulatory Visit (HOSPITAL_COMMUNITY)
Admission: RE | Admit: 2023-08-03 | Discharge: 2023-08-03 | Disposition: A | Discharge status: Home / Self Care | Source: Ambulatory Visit | Attending: Internal Medicine | Admitting: Internal Medicine

## 2023-08-03 DIAGNOSIS — Z1231 Encounter for screening mammogram for malignant neoplasm of breast: Secondary | ICD-10-CM | POA: Insufficient documentation

## 2023-08-09 ENCOUNTER — Encounter: Payer: Self-pay | Admitting: Pulmonary Disease

## 2023-09-22 ENCOUNTER — Telehealth: Payer: Self-pay | Admitting: Pulmonary Disease

## 2023-09-22 DIAGNOSIS — G4733 Obstructive sleep apnea (adult) (pediatric): Secondary | ICD-10-CM

## 2023-09-22 NOTE — Telephone Encounter (Signed)
 Copied from CRM [id number]. Topic: Clinical - Order For Equipment >> Sep 22, 2023 11:23 AM Corean Deutscher wrote: Reason for CRM: Patient called and stated her insurance is no longer accepted at the location where she gets her CPAP machine and supplies and she has dropped it a million times and pressure is not coming out like it needs to. Patient would like a new prescription to be sent into a different place that accepts her insurance. Patient would also like to have a sleep study done for a new volume of air. Patient was thankful and verbalized understanding.

## 2023-09-22 NOTE — Telephone Encounter (Signed)
**Note De-identified  [place] Obfuscation** Please advise 

## 2023-09-28 NOTE — Telephone Encounter (Signed)
 Please schedule next OV with Dr Jude

## 2023-09-28 NOTE — Telephone Encounter (Signed)
 Spoke to pt she mentioned having a new sleep study done and I sent the order in for her new cpap to new dme  Sent order for new cpap with diff dme  Pt wants to have a new sleep study performed

## 2023-09-28 NOTE — Telephone Encounter (Signed)
 Left VM for PT to call and sched w/[facility] iat [facility] location to discuss her desire for another sleep study.(See previous notes.) Will send [facility] as well.

## 2023-09-29 ENCOUNTER — Encounter (HOSPITAL_BASED_OUTPATIENT_CLINIC_OR_DEPARTMENT_OTHER): Payer: Self-pay | Admitting: Primary Care

## 2023-09-29 ENCOUNTER — Ambulatory Visit (HOSPITAL_BASED_OUTPATIENT_CLINIC_OR_DEPARTMENT_OTHER): Admitting: Primary Care

## 2023-09-29 VITALS — BP 95/41 | HR 79 | Ht 64.5 in | Wt 226.0 lb

## 2023-09-29 DIAGNOSIS — R918 Other nonspecific abnormal finding of lung field: Secondary | ICD-10-CM

## 2023-09-29 DIAGNOSIS — G4733 Obstructive sleep apnea (adult) (pediatric): Secondary | ICD-10-CM | POA: Diagnosis not present

## 2023-09-29 NOTE — Progress Notes (Signed)
 @Patient  ID: Virginia Marks, female    DOB: [birth year], 68 y.o.   MRN: [id number]  No chief complaint on file.   Referring provider: Shona Norleen PEDLAR, MD  HPI: 68 year old female, never smoked. PMH significant for HTN, diastolic heart failure, left ventricular hypertrophy, multiple pulmonary nodules, OSA on CPAP, GERD, diabetes, hypothyroidism, osteopenia, psoriatic arthritis, hyperlipidemia, obesity.   09/29/2023 Discussed the use of AI scribe software for clinical note transcription with the patient, who gave verbal consent to proceed.  History of Present Illness   Virginia Marks is a 68 year old female with moderate obstructive sleep apnea who presents for evaluation of CPAP equipment and shortness of breath.  She has a history of moderate obstructive sleep apnea diagnosed in 2019, managed with a CPAP machine set at a pressure of 10 cm H2O. She is concerned about the machine's effectiveness, noting it 'doesn't feel like it's pushing air out as hard as it used to' after being dropped multiple times. She has not received new supplies recently and uses nasal pillows, last changed 3-4 months ago. Previous DME company, [employer], no longer takes her insurance.   She experiences significant shortness of breath, particularly when walking from her car to her workplace, requiring her to stop and catch her breath. This has been a persistent issue, alleviated when using a walker. An echocardiogram in 2024 showed normal ejection fraction but mild impaired relaxation of the left ventricle. A recent CT scan revealed three calcified coronary vessels. She has a family history of pulmonary fibrosis, with her brother and father both affected. She follows with rheumatology.   No prior smoking history.   Allergies  Allergen Reactions   Latex Rash   Sulfa Antibiotics Rash    Immunization History  Administered Date(s) Administered   Influenza, High Dose Seasonal PF 12/26/2020, 02/12/2022    Influenza,inj,quad, With Preservative 01/05/2018   PFIZER(Purple Top)SARS-COV-2 Vaccination 06/12/2019, 07/03/2019   Tdap 03/29/2022    Past Medical History:  Diagnosis Date   Bilateral lower extremity edema    Chronic diastolic CHF (congestive heart failure) (HCC) 2017   cardiologist--- dr j. branch-   Depression    GERD (gastroesophageal reflux disease)    Hyperlipidemia    Hypertension    Hypothyroidism    followed by pcp   Lumbar post-laminectomy syndrome    OAB (overactive bladder)    OSA on CPAP    Psoriasis    SCALP   Psoriatic arthritis [facility])    rheumatologist--- dr mai   Spinal stenosis    SUI (stress urinary incontinence, female)    Type 2 diabetes mellitus (HCC)    followed by pcp   Wears hearing aid in both ears     Tobacco History: Social History   Tobacco Use  Smoking Status Never  Smokeless Tobacco Never   Counseling given: Not Answered   Outpatient Medications Prior to Visit  Medication Sig Dispense Refill   amphetamine-dextroamphetamine (ADDERALL) 10 MG tablet Take 10 mg by mouth daily with breakfast. Do not take on Weekends     aspirin  EC 81 MG tablet Take 1 tablet (81 mg total) by mouth daily. Swallow whole. (Patient taking differently: Take 325 mg by mouth daily. Swallow whole.)     Biotin 89999 MCG TBDP Take 10,000 mcg by mouth daily.     buPROPion (WELLBUTRIN XL) 150 MG 24 hr tablet Take 150 mg by mouth daily.      Calcium Citrate-Vitamin D (CALCIUM CITRATE + D PO) Take 1 tablet by mouth  daily. 650 mg/     clobetasol (TEMOVATE) 0.05 % external solution Apply 1 Application topically daily.     clobetasol ointment (TEMOVATE) 0.05 % Apply 1 Application topically daily as needed (Psoriasis).     Clobetasol Propionate 0.05 % shampoo Apply 1 Application topically 2 (two) times a week.     Coenzyme Q10 (Q-SORB CO Q-10) 100 MG capsule Take 100 mg by mouth daily.     Cyanocobalamin (VITAMIN B-12) 2500 MCG SUBL Place 2,500 mcg under the tongue daily.      diclofenac  (VOLTAREN ) 75 MG EC tablet Take 75 mg by mouth 2 (two) times daily as needed (Joint pain).     docusate sodium (COLACE) 100 MG capsule Take 100 mg by mouth daily.     DULoxetine (CYMBALTA) 60 MG capsule Take 60 mg by mouth at bedtime.     estradiol  (ESTRACE ) 0.1 MG/GM vaginal cream Place 1 Applicatorful vaginally 3 (three) times a week.     furosemide  (LASIX ) 20 MG tablet Take 20 mg by mouth daily.     gabapentin (NEURONTIN) 300 MG capsule Take 300 mg by mouth 3 (three) times daily.     GEMTESA  75 MG TABS Take 75 mg by mouth daily.     GLUCOSAMINE-CHONDROITIN PO Take 1 capsule by mouth at bedtime. 1500 mg / 1003 mg     levothyroxine (SYNTHROID) 75 MCG tablet Take 75 mcg by mouth daily before breakfast.      MAGNESIUM CITRATE PO Take 250 mg by mouth at bedtime.     metoprolol  tartrate (LOPRESSOR ) 25 MG tablet TAKE 1/2 TABLET BY MOUTH 2 TIMES A DAY. (Patient taking differently: Take 12.5 mg by mouth 2 (two) times daily.) 90 tablet 1   nitroGLYCERIN  (NITROSTAT ) 0.4 MG SL tablet Place 1 tablet (0.4 mg total) under the tongue every 5 (five) minutes as needed for chest pain. 25 tablet 3   olmesartan (BENICAR) 20 MG tablet Take 20 mg by mouth at bedtime.     Omega-3 Fatty Acids (FISH OIL) 1000 MG CAPS Take 1,000 mg by mouth daily.     omeprazole (PRILOSEC) 40 MG capsule Take 40 mg by mouth daily.      ondansetron  (ZOFRAN -ODT) 8 MG disintegrating tablet Take 8 mg by mouth every 8 (eight) hours as needed for vomiting or nausea.     OVER THE COUNTER MEDICATION Take 1 tablet by mouth daily. Flinstone vitamins     OZEMPIC, 1 MG/DOSE, 4 MG/3ML SOPN Inject 1 mg into the skin once a week.     Pediatric Multiple Vitamins (FLINSTONES GUMMIES OMEGA-3 DHA PO) Take by mouth.     potassium chloride  (KLOR-CON ) 10 MEQ tablet Take 10 mEq by mouth daily.     pravastatin  (PRAVACHOL ) 80 MG tablet Take 1 tablet (80 mg total) by mouth every evening. 90 tablet 3   Probiotic Product (ALIGN) 4 MG CAPS Take 4 mg  by mouth daily.     Secukinumab, 300 MG Dose, (COSENTYX SENSOREADY, 300 MG,) 150 MG/ML SOAJ Inject 300 mg into the skin every 30 (thirty) days.     No facility-administered medications prior to visit.    Review of Systems  Review of Systems  Constitutional:  Positive for fatigue.  HENT: Negative.    Respiratory:  Positive for shortness of breath.   Cardiovascular: Negative.    Physical Exam  There were no vitals taken for this visit. Physical Exam Constitutional:      General: She is not in acute distress.    Appearance:  Normal appearance. She is not ill-appearing.  HENT:     Head: Normocephalic and atraumatic.     Mouth/Throat:     Mouth: Mucous membranes are moist.     Pharynx: Oropharynx is clear.   Cardiovascular:     Rate and Rhythm: Normal rate and regular rhythm.  Pulmonary:     Effort: Pulmonary effort is normal.     Breath sounds: Normal breath sounds. No wheezing, rhonchi or rales.   Musculoskeletal:        General: Normal range of motion.   Skin:    General: Skin is warm and dry.   Neurological:     General: No focal deficit present.     Mental Status: She is alert and oriented to person, place, and time. Mental status is at baseline.   Psychiatric:        Mood and Affect: Mood normal.        Thought Content: Thought content normal.        Judgment: Judgment normal.      Lab Results:  CBC    Component Value Date/Time   WBC 6.1 05/14/2022 1642   RBC 4.43 05/14/2022 1642   HGB 13.3 04/07/2023 0726   HCT 39.0 04/07/2023 0726   PLT 336 05/14/2022 1642   MCV 95.0 05/14/2022 1642   MCH 30.2 05/14/2022 1642   MCHC 31.8 05/14/2022 1642   RDW 12.9 05/14/2022 1642   LYMPHSABS 1.1 03/09/2019 0855   MONOABS 0.5 03/09/2019 0855   EOSABS 0.4 03/09/2019 0855   BASOSABS 0.0 03/09/2019 0855    BMET    Component Value Date/Time   NA 141 04/07/2023 0726   K 4.4 04/07/2023 0726   CL 104 04/07/2023 0726   CO2 29 05/14/2022 1642   GLUCOSE 120 (H)  04/07/2023 0726   BUN 17 04/07/2023 0726   CREATININE 0.80 04/07/2023 0726   CREATININE 0.87 02/04/2019 1427   CALCIUM 9.2 05/14/2022 1642   GFRNONAA >60 05/14/2022 1642   GFRAA >60 03/09/2019 0855    BNP    Component Value Date/Time   BNP 51.0 01/14/2019 0126    ProBNP No results found for: PROBNP  Imaging: No results found.   Assessment & Plan:    Assessment and Plan    Obstructive Sleep Apnea Diagnosed with moderate obstructive sleep apnea in 2019. Currently using CPAP with a pressure setting of 10. Reports concerns about the CPAP machine's effectiveness due to ware and teat and age of the machine. Download shows consistent use with some air leaks but well-controlled events (<1 event per hour). - Order new CPAP machine at current pressure settings - Establish with a new medical supply store, potentially [facility] due to insurance changes - Advise changing nasal pillow cushions every 2-4 weeks to reduce air leaks - Follow-up for compliance within 30-90 days of receiving new CPAP  Pulmonary nodules - Several small scattered pulmonary nodules measure up to 6mm in subpleural left lower lobe, similar to previous  - FU CT chest wo contrast in 18 months   Shortness of Breath Chronic shortness of breath, exacerbated by walking without support. Echocardiogram in 2024 showed normal ejection fraction but mild impaired relaxation of the left ventricle. Recent CT scan showed three vessel coronary vascular calcification. Pulmonary nodules are unlikely to cause symptoms. Differential includes cardiac causes, deconditioning, and less likely pulmonary causes. - Consider stress test or cardiac MRI based on cardiologist's evaluation - Hold off on pulmonary function test pending cardiology evaluation  Mild Heart Failure  Echocardiogram in 2024 showed mild impaired relaxation of the left ventricle which could contribute to shortness of breath.  Coronary Artery Calcification Recent CT  scan showed three vessel coronary vascular calcification, indicating plaque buildup. This could contribute to shortness of breath and fatigue. Further evaluation by cardiology is warranted to assess the degree of obstruction.   Almarie LELON Ferrari, NP 09/29/2023

## 2023-09-29 NOTE — Patient Instructions (Signed)
 -  OBSTRUCTIVE SLEEP APNEA: Obstructive sleep apnea is a condition where your airway becomes blocked during sleep, causing breathing pauses. We will order a new CPAP machine for you and ensure you receive a compliance report for insurance within 30-90 days of receiving the new machine. You should establish care with a new medical supply store, such as [facility], and change your nasal pillow cushions every 2-4 weeks to reduce air leaks.  -SHORTNESS OF BREATH: Your shortness of breath, especially when walking, could be due to several factors including heart issues or deconditioning. We will discuss this further with your cardiologist on July 7. Based on their evaluation, you may need a stress test or cardiac MRI. We will hold off on pulmonary function tests until after the cardiology evaluation.  -MILD HEART FAILURE: Mild heart failure means your heart is not pumping as efficiently as it should, which can cause symptoms like shortness of breath. This was indicated by your echocardiogram showing mild impaired relaxation of the left ventricle. We will continue to monitor this condition.  -CORONARY ARTERY CALCIFICATION: Coronary artery calcification is the buildup of plaque in your heart's arteries, which can lead to reduced blood flow and contribute to shortness of breath and fatigue. We will discuss these findings with your cardiologist on July 7 and consider advanced cardiac imaging or a stress test based on their recommendations.  INSTRUCTIONS: Please follow up with your cardiologist on July 7 to discuss your shortness of breath and the recent findings of coronary artery calcification. Based on this evaluation, further tests such as a stress test or cardiac MRI may be recommended. Additionally, establish care with a new medical supply store for your CPAP   Orders Replace CPAP machine and renew supplies with new DME company   Follow-up: 8 weeks for CPAP compliance/can be virtual

## 2023-10-12 ENCOUNTER — Encounter: Payer: Self-pay | Admitting: *Deleted

## 2023-10-12 ENCOUNTER — Ambulatory Visit: Attending: Cardiology | Admitting: Cardiology

## 2023-10-12 ENCOUNTER — Encounter: Payer: Self-pay | Admitting: Cardiology

## 2023-10-12 VITALS — BP 122/66 | HR 81 | Ht 64.0 in | Wt 229.4 lb

## 2023-10-12 DIAGNOSIS — E782 Mixed hyperlipidemia: Secondary | ICD-10-CM

## 2023-10-12 DIAGNOSIS — Z01812 Encounter for preprocedural laboratory examination: Secondary | ICD-10-CM

## 2023-10-12 DIAGNOSIS — R079 Chest pain, unspecified: Secondary | ICD-10-CM

## 2023-10-12 DIAGNOSIS — I5032 Chronic diastolic (congestive) heart failure: Secondary | ICD-10-CM

## 2023-10-12 MED ORDER — NITROGLYCERIN 0.4 MG SL SUBL: 0.4000 mg | SUBLINGUAL_TABLET | SUBLINGUAL | 3 refills | Status: AC | PRN

## 2023-10-12 MED ORDER — METOPROLOL TARTRATE 100 MG PO TABS: 100.0000 mg | ORAL_TABLET | Freq: Once | ORAL | 0 refills | Status: AC

## 2023-10-12 NOTE — Progress Notes (Signed)
 Clinical Summary Ms. Sterbenz is a 68 y.o.female seen today for follow up of the following medical problems.    1. LE edema/Chronic diastolic HF - 2017 echo with LVEF 60-65%, grade II diastolic dysfunction  12/2018 echo LVEF 55-60%, indet diastolic function, normal RV   08/2022 echo: LVEF 65-70%, grade I dd, normal RV fxn, normal PA pressure    -08/2022 pulm appt 196 lbs. 01/2023 appt with us  225 lbs. Today she is 229 lbs. Had stopped ozempic, just restarting.  - ongoing SOB/DOE. Some increased LE edema. PCP increased lasix  to 40mg , can take additional 20mg  as needed. No orthopnea, no PND - just started ozempic at 1mg , she had prior mixed compliance on higher dose due to side effects    2. History of chest pain - 09/2017 nuclear stress no ischemia - sharp pain, mid to left sided. 7/10 in severity. Happened at rest,lasted over 5 minutes. Took NG x 2 and resolved. No other associated symptoms.      3. Coronary atherosclerosis -11/2022 3 vessel coronary atherosclerosis noted on CT scan - recent chest pain as reported above   4. Palpitations - heart monitor 10/2015 with SR, sinus tach and occasional PVCs - no recent symptoms.     5. OSA  - uses CPAP machine, followed by Dr Jude       6. Psoriatic arthritis - on methotrexate,    7. 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  Use to keep multiple foreign exchange students at her home each year but recently has been taking a breadk Has 3 dogs Past Medical History:  Diagnosis Date   Bilateral lower extremity edema    Chronic diastolic CHF (congestive heart failure) (HCC) 2017   cardiologist--- dr j. Daron Breeding-   Depression    GERD (gastroesophageal reflux disease)    Hyperlipidemia    Hypertension    Hypothyroidism    followed by pcp    Lumbar post-laminectomy syndrome    OAB (overactive bladder)    OSA on CPAP    Psoriasis    SCALP   Psoriatic arthritis [facility])    rheumatologist--- dr mai   Spinal stenosis    SUI (stress urinary incontinence, female)    Type 2 diabetes mellitus (HCC)    followed by pcp   Wears hearing aid in both ears      Allergies  Allergen Reactions   Latex Rash   Sulfa Antibiotics Rash     Current Outpatient Medications  Medication Sig Dispense Refill   amphetamine-dextroamphetamine (ADDERALL) 10 MG tablet Take 10 mg by mouth daily with breakfast. Do not take on Weekends     aspirin  EC 81 MG tablet Take 1 tablet (81 mg total) by mouth daily. Swallow whole. (Patient taking differently: Take 325 mg by mouth daily. Swallow whole.)     atorvastatin (LIPITOR) 10 MG tablet Take 10 mg by mouth daily.     Biotin 89999 MCG TBDP Take 10,000 mcg by mouth daily.     buPROPion (WELLBUTRIN XL) 150 MG 24 hr tablet Take 150 mg by mouth daily.      Calcium Citrate-Vitamin D (CALCIUM CITRATE + D PO) Take 1 tablet by mouth daily. 650 mg/     clobetasol (TEMOVATE) 0.05 % external solution  Apply 1 Application topically daily.     clobetasol ointment (TEMOVATE) 0.05 % Apply 1 Application topically daily as needed (Psoriasis).     Clobetasol Propionate 0.05 % shampoo Apply 1 Application topically 2 (two) times a week.     Coenzyme Q10 (Q-SORB CO Q-10) 100 MG capsule Take 100 mg by mouth daily.     Cyanocobalamin (VITAMIN B-12) 2500 MCG SUBL Place 2,500 mcg under the tongue daily.     diclofenac  (VOLTAREN ) 75 MG EC tablet Take 75 mg by mouth 2 (two) times daily as needed (Joint pain).     docusate sodium (COLACE) 100 MG capsule Take 100 mg by mouth daily.     DULoxetine (CYMBALTA) 60 MG capsule Take 60 mg by mouth at bedtime.     estradiol  (ESTRACE ) 0.1 MG/GM vaginal cream Place 1 Applicatorful vaginally 3 (three) times a week.     furosemide  (LASIX ) 20 MG tablet Take 20 mg by mouth daily.     gabapentin  (NEURONTIN) 300 MG capsule Take 300 mg by mouth 3 (three) times daily.     GEMTESA  75 MG TABS Take 75 mg by mouth daily. (Patient not taking: Reported on 09/29/2023)     GLUCOSAMINE-CHONDROITIN PO Take 1 capsule by mouth at bedtime. 1500 mg / 1003 mg     levothyroxine (SYNTHROID) 75 MCG tablet Take 75 mcg by mouth daily before breakfast.      MAGNESIUM CITRATE PO Take 250 mg by mouth at bedtime.     metoprolol  tartrate (LOPRESSOR ) 25 MG tablet TAKE 1/2 TABLET BY MOUTH 2 TIMES A DAY. (Patient taking differently: Take 12.5 mg by mouth 2 (two) times daily.) 90 tablet 1   nitroGLYCERIN  (NITROSTAT ) 0.4 MG SL tablet Place 1 tablet (0.4 mg total) under the tongue every 5 (five) minutes as needed for chest pain. 25 tablet 3   olmesartan (BENICAR) 20 MG tablet Take 20 mg by mouth at bedtime.     Omega-3 Fatty Acids (FISH OIL) 1000 MG CAPS Take 1,000 mg by mouth daily.     omeprazole (PRILOSEC) 40 MG capsule Take 40 mg by mouth daily.      ondansetron  (ZOFRAN -ODT) 8 MG disintegrating tablet Take 8 mg by mouth every 8 (eight) hours as needed for vomiting or nausea.     OVER THE COUNTER MEDICATION Take 1 tablet by mouth daily. Flinstone vitamins     OZEMPIC, 1 MG/DOSE, 4 MG/3ML SOPN Inject 1 mg into the skin once a week.     Pediatric Multiple Vitamins (FLINSTONES GUMMIES OMEGA-3 DHA PO) Take by mouth.     potassium chloride  (KLOR-CON ) 10 MEQ tablet Take 10 mEq by mouth daily.     pravastatin  (PRAVACHOL ) 80 MG tablet Take 1 tablet (80 mg total) by mouth every evening. (Patient not taking: Reported on 09/29/2023) 90 tablet 3   Probiotic Product (ALIGN) 4 MG CAPS Take 4 mg by mouth daily.     Secukinumab, 300 MG Dose, (COSENTYX SENSOREADY, 300 MG,) 150 MG/ML SOAJ Inject 300 mg into the skin every 30 (thirty) days.     No current facility-administered medications for this visit.     Past Surgical History:  Procedure Laterality Date   ANTERIOR CERVICAL DECOMP/DISCECTOMY FUSION  09/2015   C7 -- T1   BREAST  EXCISIONAL BIOPSY Right x2  2001 and 2002   both negative   CYSTOSCOPY N/A 03/27/2020   Procedure: CYSTOSCOPY;  Surgeon: Elisabeth Valli BIRCH, MD;  Location[facility];  Service: Urology;  Laterality: N/A;  CYSTOSCOPY WITH INJECTION N/A 04/07/2023   Procedure: CYSTOSCOPY WITH BULKAMID  INJECTION;  Surgeon: Elisabeth Valli BIRCH, MD;  Location: [facility];  Service: Urology;  Laterality: N/A;   LAPAROSCOPIC CHOLECYSTECTOMY  2001   LUMBAR FUSION  10/2014   L3 --- S1   PUBOVAGINAL SLING N/A 03/27/2020   Procedure: OZIE MID URETHRAL SLING;  Surgeon: Elisabeth Valli BIRCH, MD;  Location: [facility];  Service: Urology;  Laterality: N/A;  75 MINS   ROTATOR CUFF REPAIR Right 2019   TONSILLECTOMY  child   TOTAL ABDOMINAL HYSTERECTOMY W/ BILATERAL SALPINGOOPHORECTOMY  2003     Allergies  Allergen Reactions   Latex Rash   Sulfa Antibiotics Rash      Family History  Problem Relation Age of Onset   Hypertension Mother    Pulmonary fibrosis Father    Breast cancer Sister 64   Breast cancer Maternal Aunt 70   Breast cancer Maternal Aunt 81   Breast cancer Maternal Aunt 37   Breast cancer Maternal Aunt 66     Social History Ms. Cozzens reports that she has never smoked. She has never used smokeless tobacco. Ms. Galligan reports current alcohol use.    Physical Examination Today's Vitals   10/12/23 0850  BP: 122/66  Pulse: 81  SpO2: 95%  Weight: 229 lb 6.4 oz (104.1 kg)  Height: 5' 4 (1.626 m)   Body mass index is 39.38 kg/m.  Gen: resting comfortably, no acute distress HEENT: no scleral icterus, pupils equal round and reactive, no palptable cervical adenopathy,  CV: RRR, no m/rg, no jvd Resp: Clear to auscultation bilaterally GI: abdomen is soft, non-tender, non-distended, normal bowel sounds, no hepatosplenomegaly MSK: extremities are warm, no edema.  Skin: warm, no rash Neuro:  no focal deficits Psych: appropriate  affect   Diagnostic Studies  10/2015 echo Study Conclusions   - Left ventricle: The cavity size was normal. Wall thickness was   increased in a pattern of mild LVH. Systolic function was normal.   The estimated ejection fraction was in the range of 60% to 65%.   Wall motion was normal; there were no regional wall motion   abnormalities. Features are consistent with a pseudonormal left   ventricular filling pattern, with concomitant abnormal relaxation   and increased filling pressure (grade 2 diastolic dysfunction). - Aortic valve: Mildly calcified annulus. Trileaflet. There was   very mild stenosis. Peak velocity (S): 200 cm/s. Mean gradient   (S): 8 mm Hg. Valve area (VTI): 1.69 cm^2. Valve area (Vmax):   1.72 cm^2. Valve area (Vmean): 1.79 cm^2. - Mitral valve: Calcified annulus.   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      09/2017 nuclear stress Blood pressure demonstrated a hypertensive response to exercise. There was no ST segment deviation noted during stress. Diffuse nonspecific T wave abnormalities seen throughout study. The study is normal. No myocardial ischemia or scar. Rest images affected by artifact. This is a low risk study. Nuclear stress EF: 66%.     12/2018 echo IMPRESSIONS      1. Left ventricular ejection fraction, by visual estimation, is 55 to 60%. The left ventricle has normal function. There is mildly increased left ventricular hypertrophy.  2. Left ventricular diastolic Doppler parameters are indeterminate pattern of LV diastolic filling.  3. Global right ventricle has normal systolic function.The  right ventricular size is normal. No increase in right ventricular wall thickness.  4. Left atrial size was moderately dilated.  5. Right atrial size was normal.  6. The mitral valve  is normal in structure. No evidence of mitral valve regurgitation. No evidence of mitral stenosis.  7. The tricuspid valve is not well visualized. Tricuspid valve regurgitation is trivial.  8. The aortic valve has an indeterminant number of cusps Aortic valve regurgitation was not visualized by color flow Doppler. Structurally normal aortic valve, with no evidence of sclerosis or stenosis.  9. The pulmonic valve was not well visualized. Pulmonic valve regurgitation is not visualized by color flow Doppler. 10. The inferior vena cava is normal in size with <50% respiratory variability, suggesting right atrial pressure of 8 mmHg.    08/2022 echo    1. Left ventricular ejection fraction, by estimation, is 65 to 70%. The  left ventricle has normal function. The left ventricle has no regional  wall motion abnormalities. Left ventricular diastolic parameters are  consistent with Grade I diastolic  dysfunction (impaired relaxation).   2. Right ventricular systolic function is normal. The right ventricular  size is normal. There is normal pulmonary artery systolic pressure.   3. The mitral valve is normal in structure. Mild mitral valve  regurgitation. No evidence of mitral stenosis.   4. The aortic valve is normal in structure. Aortic valve regurgitation is  not visualized. No aortic stenosis is present.   5. The inferior vena cava is normal in size with greater than 50%  respiratory variability, suggesting right atrial pressure of 3 mmHg.    Assessment and Plan   1 Chronic diastolic HF - appears euvolemic, continue current meds - ongoing DOE may be more weight related, up 33 lbs since last year.   2.Chest pain/coronary atherosclerosis - recent chest pain. Has had ongoign DOE as well.  - known coronary atherosclerosis from chest CT - with recent chest pain and ongoing DOE will check coronary CTA - EKG today shows SR, LAFB. No acute ischemic changes   3.  Hyperlipidemia -above goal, pcp  recent changed pravastatin  to atorvastatin - follow labs        Dorn PHEBE Ross, M.D.

## 2023-10-12 NOTE — Patient Instructions (Addendum)
 Medication Instructions:   Pravastatin  removed from list today Nitroglycerin  refilled today  Continue all other medications.     Labwork:  BMET - order given today Please do just prior to CT   Testing/Procedures:  Coronary CTA  Office will contact with results via phone, letter or mychart.     Follow-Up:  3 months   Any Other Special Instructions Will Be Listed Below (If Applicable).   If you need a refill on your cardiac medications before your next appointment, please call your pharmacy.

## 2023-10-12 NOTE — Addendum Note (Signed)
 Addended by: LEIGH JON MATSU on: 10/12/2023 09:52 AM   Modules accepted: Orders

## 2023-10-13 ENCOUNTER — Other Ambulatory Visit (HOSPITAL_COMMUNITY)
Admission: RE | Admit: 2023-10-13 | Discharge: 2023-10-13 | Disposition: A | Discharge status: Home / Self Care | Source: Ambulatory Visit | Attending: Cardiology | Admitting: Cardiology

## 2023-10-13 DIAGNOSIS — I5032 Chronic diastolic (congestive) heart failure: Secondary | ICD-10-CM | POA: Insufficient documentation

## 2023-10-13 DIAGNOSIS — Z01812 Encounter for preprocedural laboratory examination: Secondary | ICD-10-CM | POA: Insufficient documentation

## 2023-10-13 LAB — BASIC METABOLIC PANEL WITH GFR
Anion gap: 8 (ref 5–15)
BUN: 16 mg/dL (ref 8–23)
CO2: 26 mmol/L (ref 22–32)
Calcium: 9 mg/dL (ref 8.9–10.3)
Chloride: 102 mmol/L (ref 98–111)
Creatinine, Ser: 0.76 mg/dL (ref 0.44–1.00)
GFR, Estimated: >60 mL/min (ref >60)
Glucose, Bld: 133 mg/dL — ABNORMAL HIGH (ref 70–99)
Potassium: 4.3 mmol/L (ref 3.5–5.1)
Sodium: 136 mmol/L (ref 135–145)

## 2023-10-14 ENCOUNTER — Encounter (HOSPITAL_COMMUNITY): Payer: Self-pay

## 2023-10-14 ENCOUNTER — Telehealth (HOSPITAL_COMMUNITY): Payer: Self-pay | Admitting: Emergency Medicine

## 2023-10-14 NOTE — Telephone Encounter (Signed)
 Reaching out to patient to offer assistance regarding upcoming cardiac imaging study; pt verbalizes understanding of appt date/time, parking situation and where to check in, pre-test NPO status and medications ordered, and verified current allergies; name and call back number provided for further questions should they arise Rockwell Alexandria RN Navigator Cardiac Imaging [facility] [phone] office [phone] cell

## 2023-10-15 ENCOUNTER — Ambulatory Visit (HOSPITAL_COMMUNITY)
Admission: RE | Admit: 2023-10-15 | Discharge: 2023-10-15 | Disposition: A | Discharge status: Home / Self Care | Source: Ambulatory Visit | Attending: Cardiology | Admitting: Cardiology

## 2023-10-15 ENCOUNTER — Ambulatory Visit (HOSPITAL_BASED_OUTPATIENT_CLINIC_OR_DEPARTMENT_OTHER)
Admission: RE | Admit: 2023-10-15 | Discharge: 2023-10-15 | Disposition: A | Discharge status: Home / Self Care | Source: Ambulatory Visit | Attending: Cardiology | Admitting: Cardiology

## 2023-10-15 ENCOUNTER — Other Ambulatory Visit: Payer: Self-pay | Admitting: Cardiology

## 2023-10-15 DIAGNOSIS — R079 Chest pain, unspecified: Secondary | ICD-10-CM

## 2023-10-15 DIAGNOSIS — I2510 Atherosclerotic heart disease of native coronary artery without angina pectoris: Secondary | ICD-10-CM | POA: Diagnosis not present

## 2023-10-15 DIAGNOSIS — R931 Abnormal findings on diagnostic imaging of heart and coronary circulation: Secondary | ICD-10-CM | POA: Insufficient documentation

## 2023-10-15 MED ORDER — NITROGLYCERIN 0.4 MG SL SUBL
0.8000 mg | SUBLINGUAL_TABLET | Freq: Once | SUBLINGUAL | Status: AC
  Administered 2023-10-15: 0.8 mg via SUBLINGUAL

## 2023-10-15 MED ORDER — IOHEXOL 350 MG/ML SOLN
100.0000 mL | Freq: Once | INTRAVENOUS | Status: AC | PRN
  Administered 2023-10-15: 100 mL via INTRAVENOUS

## 2023-10-21 ENCOUNTER — Telehealth: Payer: Self-pay | Admitting: Cardiology

## 2023-10-21 NOTE — Telephone Encounter (Signed)
 Pt called and state she had a CT 7/10 but has not heard the results back about it.  She would like a return call from the nurse to talk about the results and next steps   Best number [phone]

## 2023-10-22 ENCOUNTER — Ambulatory Visit: Payer: Self-pay | Admitting: Cardiology

## 2023-10-22 NOTE — Telephone Encounter (Signed)
 Advised that CT resulted by provider and message would be sent to provider for resulting.  Verbalized understanding.

## 2023-10-22 NOTE — Telephone Encounter (Signed)
-----   Message from Nurse Jinnie A sent at 10/22/2023  3:55 PM EDT -----  ----- Message ----- From: Alvan Dorn FALCON, MD Sent: 10/22/2023   3:23 PM EDT To: Jinnie CHRISTELLA Piety, RN  CT shows some plaque in the arteries but overall mild and not affecting blood flow. Overall reassuring findings  J BranchMD ----- Message ----- From: Interface, Rad Results In Sent: 10/15/2023   5:33 PM EDT To: Dorn FALCON Alvan, MD

## 2023-10-22 NOTE — Telephone Encounter (Signed)
 Notified, copy to pcp.

## 2024-01-07 ENCOUNTER — Ambulatory Visit: Admitting: Cardiology

## 2024-01-21 ENCOUNTER — Telehealth (HOSPITAL_BASED_OUTPATIENT_CLINIC_OR_DEPARTMENT_OTHER): Payer: Self-pay | Admitting: *Deleted

## 2024-01-21 NOTE — Telephone Encounter (Signed)
   Pre-operative Risk Assessment    Patient Name: Virginia Marks  DOB: 07/21/1955 MRN: [id number]   Date of last office visit: 10/12/23 DR. BRANCH Date of next office visit: 03/02/24 DR. BRANCH   Request for Surgical Clearance    Procedure:  VITRECTOMY, MEMBRANE PEEL GAS FLUID EXCHANGE MACULAR HOLE OF LEFT EYE  Date of Surgery:  Clearance 01/28/24                                Surgeon:  DR. CAMELLIA BAKER Surgeon's Group or Practice Name:  [facility] Phone number:  [phone] Fax number:  [phone]   Type of Clearance Requested:   - Medical  - Pharmacy:  Hold Aspirin  ;PER FORM PT WILL NEED TO DISCONTINUE ASA OR BLOOD THINNERS   Type of Anesthesia:  Not Indicated   Additional requests/questions:    Bonney Niels Jest   01/21/2024, 5:02 PM

## 2024-01-22 ENCOUNTER — Telehealth: Payer: Self-pay

## 2024-01-22 NOTE — Telephone Encounter (Signed)
  Patient Consent for Virtual Visit        Virginia Marks has provided verbal consent on 01/22/2024 for a virtual visit (video or telephone).   CONSENT FOR VIRTUAL VISIT FOR:  Virginia Marks  By participating in this virtual visit I agree to the following:  I hereby voluntarily request, consent and authorize [facility] and its employed or contracted physicians, physician assistants, nurse practitioners or other licensed health care professionals (the Practitioner), to provide me with telemedicine health care services (the "Services) as deemed necessary by the treating Practitioner. I acknowledge and consent to receive the Services by the Practitioner via telemedicine. I understand that the telemedicine visit will involve communicating with the Practitioner through live audiovisual communication technology and the disclosure of certain medical information by electronic transmission. I acknowledge that I have been given the opportunity to request an in-person assessment or other available alternative prior to the telemedicine visit and am voluntarily participating in the telemedicine visit.  I understand that I have the right to withhold or withdraw my consent to the use of telemedicine in the course of my care at any time, without affecting my right to future care or treatment, and that the Practitioner or I may terminate the telemedicine visit at any time. I understand that I have the right to inspect all information obtained and/or recorded in the course of the telemedicine visit and may receive copies of available information for a reasonable fee.  I understand that some of the potential risks of receiving the Services via telemedicine include:  Delay or interruption in medical evaluation due to technological equipment failure or disruption; Information transmitted may not be sufficient (e.g. poor resolution of images) to allow for appropriate medical decision making by the  Practitioner; and/or  In rare instances, security protocols could fail, causing a breach of personal health information.  Furthermore, I acknowledge that it is my responsibility to provide information about my medical history, conditions and care that is complete and accurate to the best of my ability. I acknowledge that Practitioner's advice, recommendations, and/or decision may be based on factors not within their control, such as incomplete or inaccurate data provided by me or distortions of diagnostic images or specimens that may result from electronic transmissions. I understand that the practice of medicine is not an exact science and that Practitioner makes no warranties or guarantees regarding treatment outcomes. I acknowledge that a copy of this consent can be made available to me via my patient portal [facility] MyChart), or I can request a printed copy by calling the office of [facility].    I understand that my insurance will be billed for this visit.   I have read or had this consent read to me. I understand the contents of this consent, which adequately explains the benefits and risks of the Services being provided via telemedicine.  I have been provided ample opportunity to ask questions regarding this consent and the Services and have had my questions answered to my satisfaction. I give my informed consent for the services to be provided through the use of telemedicine in my medical care

## 2024-01-22 NOTE — Telephone Encounter (Signed)
   Name: Virginia Marks  DOB: Jan 15, 1956  MRN: [id number]  Primary Cardiologist: Alvan Carrier, MD   Preoperative team, please contact this patient and set up a phone call appointment for further preoperative risk assessment. Please obtain consent and complete medication review. Thank you for your help.  I confirm that guidance regarding antiplatelet and oral anticoagulation therapy has been completed and, if necessary, noted below.  Per office protocol, if patient is without any new symptoms or concerns at the time of their virtual visit, he may hold ASA for 7 days prior to procedure. Please resume ASA as soon as possible postprocedure, at the discretion of the surgeon.    I also confirmed the patient resides in the state of [place] . As per [facility] Medical Board telemedicine laws, the patient must reside in the state in which the provider is licensed.   Lamarr Satterfield, NP 01/22/2024, 8:40 AM [facility]

## 2024-01-22 NOTE — Telephone Encounter (Signed)
 Preop tele appt now scheduled, med rec and consent done.

## 2024-01-25 ENCOUNTER — Ambulatory Visit: Attending: Cardiovascular Disease

## 2024-01-25 DIAGNOSIS — Z01810 Encounter for preprocedural cardiovascular examination: Secondary | ICD-10-CM

## 2024-01-25 NOTE — Progress Notes (Signed)
 Virtual Visit via Telephone Note   Because of Virginia Marks co-morbid illnesses, she is at least at moderate risk for complications without adequate follow up.  This format is felt to be most appropriate for this patient at this time.  Due to technical limitations with video connection (technology), today's appointment will be conducted as an audio only telehealth visit, and Virginia Marks verbally agreed to proceed in this manner.   All issues noted in this document were discussed and addressed.  No physical exam could be performed with this format.  Evaluation Performed:  Preoperative cardiovascular risk assessment _____________   Date:  01/25/2024   Patient ID:  Virginia Marks, DOB 04/20/1955, MRN [id number] Patient Location:  Home Provider location:   Office  Primary Care Provider:  Shona Norleen PEDLAR, MD Primary Cardiologist:  Alvan Carrier, MD Chief Complaint / Patient Profile  68 y.o. 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 History of Present Illness  Virginia Marks is a 68 y.o. female who presents via audio/video conferencing for a telehealth visit today.  Pt was last seen in cardiology clinic on 10/12/2023 by Dr. Alvan.  At that time Virginia Marks was doing well, she noted some chest discomfort and shortness of breath, she underwent coronary CTA that showed nonobstructive CAD.  The patient is now pending procedure as outlined above. Since her last visit, she has remained stable from cardiac standpoint. She denies any further chest pain and reports that her shortness of breath has improved. She denies lower extremity edema, fatigue, palpitations, melena, hematuria, hemoptysis, diaphoresis, weakness,  presyncope, syncope, orthopnea, and PND.  She is able to achieve greater than 4 METS of activity. Past Medical History    Past Medical History:  Diagnosis Date   Bilateral lower extremity edema    Chronic diastolic CHF (congestive heart failure) (HCC) 2017   cardiologist--- dr j. branch-   Depression    GERD (gastroesophageal reflux disease)    Hyperlipidemia    Hypertension    Hypothyroidism    followed by pcp   Lumbar post-laminectomy syndrome    OAB (overactive bladder)    OSA on CPAP    Psoriasis    SCALP   Psoriatic arthritis [facility])    rheumatologist--- dr mai   Spinal stenosis    SUI (stress urinary incontinence, female)    Type 2 diabetes mellitus (HCC)    followed by pcp   Wears hearing aid in both ears    Past Surgical History:  Procedure Laterality Date   ANTERIOR CERVICAL DECOMP/DISCECTOMY FUSION  09/2015   C7 -- T1   BREAST EXCISIONAL BIOPSY Right x2  2001 and 2002   both negative   CYSTOSCOPY N/A 03/27/2020   Procedure: CYSTOSCOPY;  Surgeon: Elisabeth Valli BIRCH, MD;  Location[facility];  Service: Urology;  Laterality: N/A;   CYSTOSCOPY WITH INJECTION N/A 04/07/2023   Procedure: CYSTOSCOPY WITH BULKAMID  INJECTION;  Surgeon: Elisabeth Valli BIRCH, MD;  Location: [facility];  Service: Urology;  Laterality: N/A;   LAPAROSCOPIC CHOLECYSTECTOMY  2001   LUMBAR FUSION  10/2014   L3 --- S1   PUBOVAGINAL SLING N/A 03/27/2020   Procedure: OZIE MID URETHRAL SLING;  Surgeon: Elisabeth Valli BIRCH, MD;  Location: [facility];  Service: Urology;  Laterality: N/A;  75 MINS   ROTATOR CUFF REPAIR Right 2019   TONSILLECTOMY  child   TOTAL ABDOMINAL HYSTERECTOMY W/ BILATERAL SALPINGOOPHORECTOMY  2003   Allergies Allergies  Allergen Reactions   Latex Rash   Sulfa Antibiotics Rash   Home Medications    Prior to Admission medications   Medication Sig Start Date End Date Taking? Authorizing Provider  amphetamine-dextroamphetamine  (ADDERALL) 10 MG tablet Take 10 mg by mouth daily with breakfast. Do not take on Weekends 02/12/22   [provider]  aspirin  EC 81 MG tablet Take 1 tablet (81 mg total) by mouth daily. Swallow whole. 01/07/23   Alvan Dorn FALCON, MD  atorvastatin (LIPITOR) 10 MG tablet Take 10 mg by mouth daily.    [provider]  Biotin 89999 MCG TBDP Take 10,000 mcg by mouth daily.    [provider]  buPROPion (WELLBUTRIN XL) 150 MG 24 hr tablet Take 150 mg by mouth daily.  01/13/19   [provider]  Calcium Citrate-Vitamin D (CALCIUM CITRATE + D PO) Take 1 tablet by mouth daily. 650 mg/    [provider]  clobetasol (TEMOVATE) 0.05 % external solution Apply 1 Application topically daily.    [provider]  clobetasol ointment (TEMOVATE) 0.05 % Apply 1 Application topically daily as needed (Psoriasis).    [provider]  Clobetasol Propionate 0.05 % shampoo Apply 1 Application topically 2 (two) times a week.    [provider]  Coenzyme Q10 (Q-SORB CO Q-10) 100 MG capsule Take 100 mg by mouth daily.    [provider]  Cyanocobalamin (VITAMIN B-12) 2500 MCG SUBL Place 2,500 mcg under the tongue daily.    [provider]  Dapagliflozin Pro-metFORMIN ER (XIGDUO XR) 01-999 MG TB24 Take 1 tablet by mouth daily.    [provider]  diclofenac  (VOLTAREN ) 75 MG EC tablet Take 75 mg by mouth 2 (two) times daily as needed (Joint pain). 03/20/23   [provider]  docusate sodium (COLACE) 100 MG capsule Take 100 mg by mouth daily.    [provider]  DULoxetine (CYMBALTA) 60 MG capsule Take 60 mg by mouth at bedtime. 12/09/18   [provider]  estradiol  (ESTRACE ) 0.1 MG/GM vaginal cream Place 1 Applicatorful vaginally 3 (three) times a week.    [provider]  furosemide  (LASIX ) 40 MG tablet Take 40 mg by mouth daily.    [provider]  gabapentin (NEURONTIN) 300 MG capsule  Take 300 mg by mouth 3 (three) times daily.    [provider]  GLUCOSAMINE-CHONDROITIN PO Take 1 capsule by mouth at bedtime. 1500 mg / 1003 mg    [provider]  levothyroxine (SYNTHROID) 75 MCG tablet Take 75 mcg by mouth daily before breakfast.  01/12/19   [provider]  MAGNESIUM CITRATE PO Take 250 mg by mouth at bedtime.    [provider]  metoprolol  tartrate (LOPRESSOR ) 100 MG tablet Take 1 tablet (100 mg total) by mouth once for 1 dose. 10/12/23 01/22/24  Alvan Dorn FALCON, MD  metoprolol  tartrate (LOPRESSOR ) 25 MG tablet TAKE 1/2 TABLET BY MOUTH 2 TIMES A DAY. Patient taking differently: Take 12.5 mg by mouth 2 (two) times daily. 08/24/19   Alvan Dorn FALCON, MD  nitroGLYCERIN  (NITROSTAT ) 0.4 MG SL tablet Place 1 tablet (0.4 mg total) under the tongue every 5 (five) minutes as needed for chest pain. 10/12/23 01/22/24  Alvan Dorn FALCON, MD  olmesartan (BENICAR) 20 MG tablet  Take 20 mg by mouth at bedtime. 12/09/18   [provider]  Omega-3 Fatty Acids (FISH OIL) 1000 MG CAPS Take 1,000 mg by mouth daily.    [provider]  omeprazole (PRILOSEC) 40 MG capsule Take 40 mg by mouth daily.  11/28/18   [provider]  ondansetron  (ZOFRAN -ODT) 8 MG disintegrating tablet Take 8 mg by mouth every 8 (eight) hours as needed for vomiting or nausea.    [provider]  OVER THE COUNTER MEDICATION Take 1 tablet by mouth daily. Flinstone vitamins    [provider]  OZEMPIC, 1 MG/DOSE, 4 MG/3ML SOPN Inject 1 mg into the skin once a week. 04/07/23   [provider]  Pediatric Multiple Vitamins (FLINSTONES GUMMIES OMEGA-3 DHA PO) Take by mouth.    [provider]  potassium chloride  (KLOR-CON ) 10 MEQ tablet Take 10 mEq by mouth daily. 02/12/22   [provider]  Probiotic Product (ALIGN) 4 MG CAPS Take 4 mg by mouth daily.    [provider]  Secukinumab, 300 MG Dose, (COSENTYX SENSOREADY,  300 MG,) 150 MG/ML SOAJ Inject 300 mg into the skin every 30 (thirty) days.    [provider]    Physical Exam  Vital Signs:  Kimaria Struthers does not have vital signs available for review today. Given telephonic nature of communication, physical exam is limited. AAOx3. NAD. Normal affect.  Speech and respirations are unlabored. Accessory Clinical Findings  None Assessment & Plan    1.  Preoperative Cardiovascular Risk Assessment: Ms. Eagen perioperative risk of a major cardiac event is 0.9% according to the Revised Cardiac Risk Index (RCRI).  Therefore, she is at low risk for perioperative complications.   Her functional capacity is good at 5.38 METs according to the Duke Activity Status Index (DASI). Recommendations: According to ACC/AHA guidelines, no further cardiovascular testing needed.  The patient may proceed to surgery at acceptable risk.   Antiplatelet and/or Anticoagulation Recommendations: Aspirin  can be held for 5-7 days prior to her surgery.  Please resume Aspirin  post operatively when it is felt to be safe from a bleeding standpoint.    The patient was advised that if she develops new symptoms prior to surgery to contact our office to arrange for a follow-up visit, and she verbalized understanding.  A copy of this note will be routed to requesting surgeon.  Time:   Today, I have spent 10 minutes with the patient with telehealth technology discussing medical history, symptoms, and management plan.    [place] D Alette Kataoka, NP  01/25/2024, 5:07 PM

## 2024-02-08 ENCOUNTER — Other Ambulatory Visit (HOSPITAL_COMMUNITY): Payer: Self-pay | Admitting: Internal Medicine

## 2024-02-08 DIAGNOSIS — M8580 Other specified disorders of bone density and structure, unspecified site: Secondary | ICD-10-CM

## 2024-03-02 ENCOUNTER — Ambulatory Visit: Attending: Cardiology | Admitting: Cardiology

## 2024-03-02 VITALS — BP 120/70 | HR 80 | Ht 64.0 in | Wt 230.0 lb

## 2024-03-02 DIAGNOSIS — R079 Chest pain, unspecified: Secondary | ICD-10-CM | POA: Diagnosis not present

## 2024-03-02 DIAGNOSIS — I5032 Chronic diastolic (congestive) heart failure: Secondary | ICD-10-CM

## 2024-03-02 DIAGNOSIS — E782 Mixed hyperlipidemia: Secondary | ICD-10-CM

## 2024-03-02 NOTE — Progress Notes (Signed)
 Clinical Summary Virginia Marks is a 68 y.o.female seen today for follow up of the following medical problems.    1. LE edema/Chronic diastolic HF - 2017 echo with LVEF 60-65%, grade II diastolic dysfunction  12/2018 echo LVEF 55-60%, indet diastolic function, normal RV   08/2022 echo: LVEF 65-70%, grade I dd, normal RV fxn, normal PA pressure    -no recent edema. Some SOB with she attributes to increased exertion due to back pains.      2. History of chest pain - 09/2017 nuclear stress no ischemia - sharp pain, mid to left sided. 7/10 in severity. Happened at rest,lasted over 5 minutes. Took NG x 2 and resolved. No other associated symptoms.       3. Coronary atherosclerosis -11/2022 3 vessel coronary atherosclerosis noted on CT scan - recent chest pain as reported above  10/2023 coronary CTA no flow limiting lesions by FFR, mild LAD/LCX/RCA disease - no recent chest pains.    4. Palpitations - heart monitor 10/2015 with SR, sinus tach and occasional PVCs -no recent palpitations.      5. OSA  - uses CPAP machine, followed by Dr Jude       6. Psoriatic arthritis - on methotrexate,    7. 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  Use to keep multiple foreign exchange students at her home each year but recently has been taking a breadk Has 3 dogs   Past Medical History:  Diagnosis Date   Bilateral lower extremity edema    Chronic diastolic CHF (congestive heart failure) (HCC) 2017   cardiologist--- dr j. Tykesha Konicki-   Depression    GERD (gastroesophageal reflux disease)    Hyperlipidemia    Hypertension    Hypothyroidism    followed by pcp   Lumbar post-laminectomy syndrome    OAB (overactive bladder)    OSA on CPAP    Psoriasis    SCALP    Psoriatic arthritis [facility])    rheumatologist--- dr mai   Spinal stenosis    SUI (stress urinary incontinence, female)    Type 2 diabetes mellitus (HCC)    followed by pcp   Wears hearing aid in both ears      Allergies  Allergen Reactions   Latex Rash   Sulfa Antibiotics Rash     Current Outpatient Medications  Medication Sig Dispense Refill   amphetamine-dextroamphetamine (ADDERALL) 10 MG tablet Take 10 mg by mouth daily with breakfast. Do not take on Weekends     aspirin  EC 81 MG tablet Take 1 tablet (81 mg total) by mouth daily. Swallow whole.     atorvastatin (LIPITOR) 10 MG tablet Take 10 mg by mouth daily.     Biotin 89999 MCG TBDP Take 10,000 mcg by mouth daily.     buPROPion (WELLBUTRIN XL) 150 MG 24 hr tablet Take 150 mg by mouth daily.      Calcium Citrate-Vitamin D (CALCIUM CITRATE + D PO) Take 1 tablet by mouth daily. 650 mg/     clobetasol (TEMOVATE) 0.05 % external solution Apply 1 Application topically daily.     clobetasol ointment (TEMOVATE) 0.05 % Apply 1 Application topically daily as needed (Psoriasis).  Clobetasol Propionate 0.05 % shampoo Apply 1 Application topically 2 (two) times a week.     Coenzyme Q10 (Q-SORB CO Q-10) 100 MG capsule Take 100 mg by mouth daily.     Cyanocobalamin (VITAMIN B-12) 2500 MCG SUBL Place 2,500 mcg under the tongue daily.     Dapagliflozin Pro-metFORMIN ER (XIGDUO XR) 01-999 MG TB24 Take 1 tablet by mouth daily.     diclofenac  (VOLTAREN ) 75 MG EC tablet Take 75 mg by mouth 2 (two) times daily as needed (Joint pain).     docusate sodium (COLACE) 100 MG capsule Take 100 mg by mouth daily.     DULoxetine (CYMBALTA) 60 MG capsule Take 60 mg by mouth at bedtime.     estradiol  (ESTRACE ) 0.1 MG/GM vaginal cream Place 1 Applicatorful vaginally 3 (three) times a week.     furosemide  (LASIX ) 40 MG tablet Take 40 mg by mouth daily.     gabapentin (NEURONTIN) 300 MG capsule Take 300 mg by mouth 3 (three) times daily.      GLUCOSAMINE-CHONDROITIN PO Take 1 capsule by mouth at bedtime. 1500 mg / 1003 mg     levothyroxine (SYNTHROID) 75 MCG tablet Take 75 mcg by mouth daily before breakfast.      MAGNESIUM CITRATE PO Take 250 mg by mouth at bedtime.     metoprolol  tartrate (LOPRESSOR ) 100 MG tablet Take 1 tablet (100 mg total) by mouth once for 1 dose. 1 tablet 0   metoprolol  tartrate (LOPRESSOR ) 25 MG tablet TAKE 1/2 TABLET BY MOUTH 2 TIMES A DAY. (Patient taking differently: Take 12.5 mg by mouth 2 (two) times daily.) 90 tablet 1   nitroGLYCERIN  (NITROSTAT ) 0.4 MG SL tablet Place 1 tablet (0.4 mg total) under the tongue every 5 (five) minutes as needed for chest pain. 25 tablet 3   olmesartan (BENICAR) 20 MG tablet Take 20 mg by mouth at bedtime.     Omega-3 Fatty Acids (FISH OIL) 1000 MG CAPS Take 1,000 mg by mouth daily.     omeprazole (PRILOSEC) 40 MG capsule Take 40 mg by mouth daily.      ondansetron  (ZOFRAN -ODT) 8 MG disintegrating tablet Take 8 mg by mouth every 8 (eight) hours as needed for vomiting or nausea.     OVER THE COUNTER MEDICATION Take 1 tablet by mouth daily. Flinstone vitamins     OZEMPIC, 1 MG/DOSE, 4 MG/3ML SOPN Inject 1 mg into the skin once a week.     Pediatric Multiple Vitamins (FLINSTONES GUMMIES OMEGA-3 DHA PO) Take by mouth.     potassium chloride  (KLOR-CON ) 10 MEQ tablet Take 10 mEq by mouth daily.     Probiotic Product (ALIGN) 4 MG CAPS Take 4 mg by mouth daily.     Secukinumab, 300 MG Dose, (COSENTYX SENSOREADY, 300 MG,) 150 MG/ML SOAJ Inject 300 mg into the skin every 30 (thirty) days.     No current facility-administered medications for this visit.     Past Surgical History:  Procedure Laterality Date   ANTERIOR CERVICAL DECOMP/DISCECTOMY FUSION  09/2015   C7 -- T1   BREAST EXCISIONAL BIOPSY Right x2  2001 and 2002   both negative   CYSTOSCOPY N/A 03/27/2020   Procedure: CYSTOSCOPY;  Surgeon: Elisabeth Valli BIRCH, MD;  Location: [facility];  Service: Urology;   Laterality: N/A;   CYSTOSCOPY WITH INJECTION N/A 04/07/2023   Procedure: CYSTOSCOPY WITH BULKAMID  INJECTION;  Surgeon: Elisabeth Valli BIRCH, MD;  Location: [facility];  Service: Urology;  Laterality: N/A;   LAPAROSCOPIC CHOLECYSTECTOMY  2001   LUMBAR FUSION  10/2014   L3 --- S1   PUBOVAGINAL SLING N/A 03/27/2020   Procedure: OZIE MID URETHRAL SLING;  Surgeon: Elisabeth Valli BIRCH, MD;  Location: [facility];  Service: Urology;  Laterality: N/A;  75 MINS   ROTATOR CUFF REPAIR Right 2019   TONSILLECTOMY  child   TOTAL ABDOMINAL HYSTERECTOMY W/ BILATERAL SALPINGOOPHORECTOMY  2003     Allergies  Allergen Reactions   Latex Rash   Sulfa Antibiotics Rash      Family History  Problem Relation Age of Onset   Hypertension Mother    Pulmonary fibrosis Father    Breast cancer Sister 92   Breast cancer Maternal Aunt 106   Breast cancer Maternal Aunt 79   Breast cancer Maternal Aunt 34   Breast cancer Maternal Aunt 42     Social History Ms. Eskelson reports that she has never smoked. She has never used smokeless tobacco. Ms. Dobies reports current alcohol use.     Physical Examination Today's Vitals   03/02/24 1603  BP: 120/70  Pulse: 80  SpO2: 96%  Weight: 230 lb (104.3 kg)  Height: 5' 4 (1.626 m)   Body mass index is 39.48 kg/m.  Gen: resting comfortably, no acute distress HEENT: no scleral icterus, pupils equal round and reactive, no palptable cervical adenopathy,  CV: RRR, no m/rg no jvd Resp: Clear to auscultation bilaterally GI: abdomen is soft, non-tender, non-distended, normal bowel sounds, no hepatosplenomegaly MSK: extremities are warm, no edema.  Skin: warm, no rash Neuro:  no focal deficits Psych: appropriate affect   Diagnostic Studies  10/2015 echo Study Conclusions   - Left ventricle: The cavity size was normal. Wall thickness was   increased in a pattern of mild LVH. Systolic function was normal.   The estimated ejection  fraction was in the range of 60% to 65%.   Wall motion was normal; there were no regional wall motion   abnormalities. Features are consistent with a pseudonormal left   ventricular filling pattern, with concomitant abnormal relaxation   and increased filling pressure (grade 2 diastolic dysfunction). - Aortic valve: Mildly calcified annulus. Trileaflet. There was   very mild stenosis. Peak velocity (S): 200 cm/s. Mean gradient   (S): 8 mm Hg. Valve area (VTI): 1.69 cm^2. Valve area (Vmax):   1.72 cm^2. Valve area (Vmean): 1.79 cm^2. - Mitral valve: Calcified annulus.   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      09/2017 nuclear stress Blood pressure demonstrated a hypertensive response to exercise. There was no ST segment deviation noted during stress. Diffuse nonspecific T wave abnormalities seen throughout study. The study is normal. No myocardial ischemia or scar. Rest images affected by artifact. This is a low risk study. Nuclear stress EF: 66%.     12/2018 echo IMPRESSIONS      1. Left ventricular ejection fraction, by visual estimation, is 55 to 60%. The left ventricle has normal function. There is mildly increased left ventricular hypertrophy.  2. Left ventricular diastolic Doppler parameters are indeterminate pattern of LV diastolic filling.  3. Global right ventricle has normal systolic function.The right ventricular size is normal. No increase in right ventricular wall thickness.  4. Left atrial size was moderately dilated.  5. Right atrial size was normal.  6. The  mitral valve is normal in structure. No evidence of mitral valve regurgitation. No evidence of mitral stenosis.  7. The tricuspid valve is not well visualized. Tricuspid valve regurgitation is trivial.  8. The aortic valve has an  indeterminant number of cusps Aortic valve regurgitation was not visualized by color flow Doppler. Structurally normal aortic valve, with no evidence of sclerosis or stenosis.  9. The pulmonic valve was not well visualized. Pulmonic valve regurgitation is not visualized by color flow Doppler. 10. The inferior vena cava is normal in size with <50% respiratory variability, suggesting right atrial pressure of 8 mmHg.    08/2022 echo    1. Left ventricular ejection fraction, by estimation, is 65 to 70%. The  left ventricle has normal function. The left ventricle has no regional  wall motion abnormalities. Left ventricular diastolic parameters are  consistent with Grade I diastolic  dysfunction (impaired relaxation).   2. Right ventricular systolic function is normal. The right ventricular  size is normal. There is normal pulmonary artery systolic pressure.   3. The mitral valve is normal in structure. Mild mitral valve  regurgitation. No evidence of mitral stenosis.   4. The aortic valve is normal in structure. Aortic valve regurgitation is  not visualized. No aortic stenosis is present.   5. The inferior vena cava is normal in size with greater than 50%  respiratory variability, suggesting right atrial pressure of 3 mmHg.    10/2023 coronary CTA IMPRESSION: 1. Coronary calcium score of 1546. This was 75 percentile for age-, sex, and race-matched controls.   2. Total plaque volume 1246 mm3 which is 96 percentile for age- and sex-matched controls (calcified plaque 269 mm3; non-calcified plaque 977 mm3). TPV is (extensive).   3. Normal coronary origin with right dominance.   4. Mild (25-49) stenoses in the LAD, Lcx and RCA; study will be sent for FFR. -IMPRESSION: FFR suggests no flow limiting stenosis.   Note: These examples are not recommendations of [employer] and only provided as examples of what other customers are doing.    Assessment and Plan   1 Chronic diastolic HF -  euvolemic without symptoms, continue current meds   2.Chest pain/coronary atherosclerosis - recent coronary CTA without significant disease - no recent symptoms - continue risk factor modification   3.  Hyperlipidemia -LDL at goal, continue dietary modification to improve TGs     Dorn PHEBE Ross, M.D.

## 2024-03-02 NOTE — Patient Instructions (Signed)
 Medication Instructions:  Your physician recommends that you continue on your current medications as directed. Please refer to the Current Medication list given to you today.  *If you need a refill on your cardiac medications before your next appointment, please call your pharmacy*  Lab Work: None If you have labs (blood work) drawn today and your tests are completely normal, you will receive your results only by: MyChart Message (if you have MyChart) OR A paper copy in the mail If you have any lab test that is abnormal or we need to change your treatment, we will call you to review the results.  Testing/Procedures: None  Follow-Up: At [facility], you and your health needs are our priority.  As part of our continuing mission to provide you with exceptional heart care, our providers are all part of one team.  This team includes your primary Cardiologist (physician) and Advanced Practice Providers or APPs (Physician Assistants and Nurse Practitioners) who all work together to provide you with the care you need, when you need it.  Your next appointment:   6 month(s)  Provider:   You may see Dina Rich, MD or the following Advanced Practice Provider on your designated Care Team:   Sharlene Dory, NP    We recommend signing up for the patient portal called "MyChart".  Sign up information is provided on this After Visit Summary.  MyChart is used to connect with patients for Virtual Visits (Telemedicine).  Patients are able to view lab/test results, encounter notes, upcoming appointments, etc.  Non-urgent messages can be sent to your provider as well.   To learn more about what you can do with MyChart, go to [email or web address].   Other Instructions

## 2024-03-04 ENCOUNTER — Ambulatory Visit (HOSPITAL_COMMUNITY)
Admission: RE | Admit: 2024-03-04 | Discharge: 2024-03-04 | Disposition: A | Discharge status: Home / Self Care | Source: Ambulatory Visit | Attending: Internal Medicine | Admitting: Internal Medicine

## 2024-03-04 ENCOUNTER — Other Ambulatory Visit (HOSPITAL_COMMUNITY)

## 2024-03-04 DIAGNOSIS — M8580 Other specified disorders of bone density and structure, unspecified site: Secondary | ICD-10-CM | POA: Insufficient documentation

## 2024-04-27 ENCOUNTER — Telehealth (HOSPITAL_BASED_OUTPATIENT_CLINIC_OR_DEPARTMENT_OTHER): Payer: Self-pay

## 2024-04-27 NOTE — Telephone Encounter (Signed)
"  ° °  Pre-operative Risk Assessment    Patient Name: Virginia Marks  DOB: 01-19-1956 MRN: [id number]   Date of last office visit: 03/02/2024 with Dr. Alvan Date of next office visit: None  Request for Surgical Clearance    Procedure:  L1-2, L2-3 Lumbar fusion  Date of Surgery:  Clearance TBD                                 Surgeon:  Dr. Alm Molt Surgeon's Group or Practice Name:  [facility] Phone number:  [phone] Fax number:  [phone]   Type of Clearance Requested:   - Medical  - Pharmacy:  Hold Aspirin  -does not specify   Type of Anesthesia:  General    Additional requests/questions:  None  SignedPatrcia Iverson CROME   04/27/2024, 4:26 PM   "

## 2024-04-27 NOTE — Telephone Encounter (Signed)
 Dr. Alvan Lanius saw this patient on 03/02/24. Per office protocol, will you please comment on medical clearance for L1-2, L2-3 Lumbar fusion?  Please route your response to P CV DIV Preop. I will communicate with requesting office once you have given recommendations.   Thank you!  Mardy Pizza, NP

## 2024-04-28 NOTE — Telephone Encounter (Signed)
 Ok to proceed with surgery from cardiac standpoint  Dominga Ferry MD

## 2024-04-28 NOTE — Telephone Encounter (Signed)
" ° °  Patient Name: Virginia Marks  DOB: [birth year] MRN: [id number]  Primary Cardiologist: Alvan Carrier, MD  Chart reviewed as part of pre-operative protocol coverage. Pre-op clearance already addressed by colleagues in earlier phone notes. To summarize recommendations:  -Ok to proceed with surgery from cardiac standpoint JINNY Alvan MD  -Regarding ASA therapy, we recommend continuation of ASA throughout the perioperative period.  However, if the surgeon feels that cessation of ASA is required in the perioperative period, it may be stopped 5-7 days prior to surgery with a plan to resume it as soon as felt to be feasible from a surgical standpoint in the post-operative period.  Will route this bundled recommendation to requesting provider via Epic fax function and remove from pre-op pool. Please call with questions.  Mardy KATHEE Pizza, FNP 04/28/2024, 2:06 PM  "

## 2024-05-10 ENCOUNTER — Encounter: Payer: Self-pay | Admitting: Internal Medicine
